# Patient Record
Sex: Female | Born: 1945 | Race: Black or African American | Hispanic: No | Smoking: Current some day smoker
Health system: Southern US, Community
[De-identification: ages and names within clinical notes are randomized; demographics above are authoritative.]

## PROBLEM LIST (undated history)

## (undated) DIAGNOSIS — J189 Pneumonia, unspecified organism: Secondary | ICD-10-CM

## (undated) DIAGNOSIS — R112 Nausea with vomiting, unspecified: Secondary | ICD-10-CM

## (undated) DIAGNOSIS — Z9889 Other specified postprocedural states: Secondary | ICD-10-CM

## (undated) DIAGNOSIS — K5792 Diverticulitis of intestine, part unspecified, without perforation or abscess without bleeding: Secondary | ICD-10-CM

## (undated) DIAGNOSIS — G8929 Other chronic pain: Secondary | ICD-10-CM

## (undated) DIAGNOSIS — M792 Neuralgia and neuritis, unspecified: Secondary | ICD-10-CM

## (undated) DIAGNOSIS — R011 Cardiac murmur, unspecified: Secondary | ICD-10-CM

## (undated) DIAGNOSIS — M5410 Radiculopathy, site unspecified: Secondary | ICD-10-CM

## (undated) DIAGNOSIS — R569 Unspecified convulsions: Secondary | ICD-10-CM

## (undated) DIAGNOSIS — K7460 Unspecified cirrhosis of liver: Secondary | ICD-10-CM

## (undated) DIAGNOSIS — M25551 Pain in right hip: Secondary | ICD-10-CM

## (undated) DIAGNOSIS — R102 Pelvic and perineal pain unspecified side: Secondary | ICD-10-CM

## (undated) DIAGNOSIS — M1990 Unspecified osteoarthritis, unspecified site: Secondary | ICD-10-CM

## (undated) DIAGNOSIS — I10 Essential (primary) hypertension: Secondary | ICD-10-CM

## (undated) DIAGNOSIS — M545 Low back pain, unspecified: Secondary | ICD-10-CM

## (undated) DIAGNOSIS — R350 Frequency of micturition: Secondary | ICD-10-CM

## (undated) DIAGNOSIS — M5430 Sciatica, unspecified side: Secondary | ICD-10-CM

## (undated) DIAGNOSIS — N189 Chronic kidney disease, unspecified: Secondary | ICD-10-CM

## (undated) DIAGNOSIS — M549 Dorsalgia, unspecified: Secondary | ICD-10-CM

## (undated) DIAGNOSIS — M25519 Pain in unspecified shoulder: Secondary | ICD-10-CM

## (undated) HISTORY — DX: Unspecified cirrhosis of liver: K74.60

## (undated) HISTORY — DX: Unspecified osteoarthritis, unspecified site: M19.90

## (undated) HISTORY — PX: ABDOMINAL HYSTERECTOMY: SHX81

## (undated) HISTORY — PX: JOINT REPLACEMENT: SHX530

## (undated) HISTORY — PX: CHOLECYSTECTOMY: SHX55

## (undated) HISTORY — PX: BACK SURGERY: SHX140

## (undated) HISTORY — PX: COLONOSCOPY W/ POLYPECTOMY: SHX1380

---

## 1998-02-24 ENCOUNTER — Other Ambulatory Visit: Admission: RE | Admit: 1998-02-24 | Discharge: 1998-02-24 | Payer: Self-pay | Admitting: Gastroenterology

## 2005-07-05 ENCOUNTER — Emergency Department (HOSPITAL_COMMUNITY): Admission: EM | Admit: 2005-07-05 | Discharge: 2005-07-05 | Payer: Self-pay | Admitting: Emergency Medicine

## 2009-10-04 ENCOUNTER — Emergency Department: Payer: Self-pay | Admitting: Emergency Medicine

## 2012-01-25 ENCOUNTER — Encounter (HOSPITAL_COMMUNITY): Payer: Self-pay | Admitting: *Deleted

## 2012-01-25 ENCOUNTER — Emergency Department (HOSPITAL_COMMUNITY): Payer: Medicare Other

## 2012-01-25 ENCOUNTER — Emergency Department (HOSPITAL_COMMUNITY)
Admission: EM | Admit: 2012-01-25 | Discharge: 2012-01-25 | Disposition: A | Discharge status: Home / Self Care | Payer: Medicare Other | Attending: Emergency Medicine | Admitting: Emergency Medicine

## 2012-01-25 DIAGNOSIS — Z882 Allergy status to sulfonamides status: Secondary | ICD-10-CM | POA: Insufficient documentation

## 2012-01-25 DIAGNOSIS — Z79899 Other long term (current) drug therapy: Secondary | ICD-10-CM | POA: Insufficient documentation

## 2012-01-25 DIAGNOSIS — G8929 Other chronic pain: Secondary | ICD-10-CM | POA: Insufficient documentation

## 2012-01-25 DIAGNOSIS — R109 Unspecified abdominal pain: Secondary | ICD-10-CM | POA: Insufficient documentation

## 2012-01-25 DIAGNOSIS — F17200 Nicotine dependence, unspecified, uncomplicated: Secondary | ICD-10-CM | POA: Insufficient documentation

## 2012-01-25 DIAGNOSIS — M159 Polyosteoarthritis, unspecified: Secondary | ICD-10-CM | POA: Insufficient documentation

## 2012-01-25 DIAGNOSIS — M549 Dorsalgia, unspecified: Secondary | ICD-10-CM

## 2012-01-25 DIAGNOSIS — Z880 Allergy status to penicillin: Secondary | ICD-10-CM | POA: Insufficient documentation

## 2012-01-25 DIAGNOSIS — Z888 Allergy status to other drugs, medicaments and biological substances status: Secondary | ICD-10-CM | POA: Insufficient documentation

## 2012-01-25 DIAGNOSIS — E119 Type 2 diabetes mellitus without complications: Secondary | ICD-10-CM | POA: Insufficient documentation

## 2012-01-25 DIAGNOSIS — M160 Bilateral primary osteoarthritis of hip: Secondary | ICD-10-CM

## 2012-01-25 DIAGNOSIS — I10 Essential (primary) hypertension: Secondary | ICD-10-CM | POA: Insufficient documentation

## 2012-01-25 HISTORY — DX: Other chronic pain: G89.29

## 2012-01-25 HISTORY — DX: Pelvic and perineal pain: R10.2

## 2012-01-25 HISTORY — DX: Pain in unspecified shoulder: M25.519

## 2012-01-25 HISTORY — DX: Dorsalgia, unspecified: M54.9

## 2012-01-25 HISTORY — DX: Pelvic and perineal pain unspecified side: R10.20

## 2012-01-25 HISTORY — DX: Sciatica, unspecified side: M54.30

## 2012-01-25 HISTORY — DX: Essential (primary) hypertension: I10

## 2012-01-25 HISTORY — DX: Pain in right hip: M25.551

## 2012-01-25 LAB — URINALYSIS, ROUTINE W REFLEX MICROSCOPIC
Bilirubin Urine: NEGATIVE
Glucose, UA: NEGATIVE mg/dL
Hgb urine dipstick: NEGATIVE
Ketones, ur: NEGATIVE mg/dL
Leukocytes, UA: NEGATIVE
Nitrite: NEGATIVE
Protein, ur: NEGATIVE mg/dL
Specific Gravity, Urine: 1.025 (ref 1.005–1.030)
Urobilinogen, UA: 0.2 mg/dL (ref 0.0–1.0)
pH: 6 (ref 5.0–8.0)

## 2012-01-25 LAB — BASIC METABOLIC PANEL WITH GFR
BUN: 18 mg/dL (ref 6–23)
CO2: 27 meq/L (ref 19–32)
Calcium: 10 mg/dL (ref 8.4–10.5)
Chloride: 103 meq/L (ref 96–112)
Creatinine, Ser: 0.94 mg/dL (ref 0.50–1.10)
GFR calc Af Amer: 72 mL/min — ABNORMAL LOW
GFR calc non Af Amer: 62 mL/min — ABNORMAL LOW
Glucose, Bld: 136 mg/dL — ABNORMAL HIGH (ref 70–99)
Potassium: 3.4 meq/L — ABNORMAL LOW (ref 3.5–5.1)
Sodium: 141 meq/L (ref 135–145)

## 2012-01-25 LAB — CBC WITH DIFFERENTIAL/PLATELET
Basophils Absolute: 0 10*3/uL (ref 0.0–0.1)
Basophils Relative: 0 % (ref 0–1)
Eosinophils Relative: 0 % (ref 0–5)
HCT: 38 % (ref 36.0–46.0)
Hemoglobin: 12.8 g/dL (ref 12.0–15.0)
MCHC: 33.7 g/dL (ref 30.0–36.0)
MCV: 85 fL (ref 78.0–100.0)
Monocytes Absolute: 0.3 10*3/uL (ref 0.1–1.0)
Monocytes Relative: 4 % (ref 3–12)
RDW: 12.9 % (ref 11.5–15.5)

## 2012-01-25 MED ORDER — OXYCODONE-ACETAMINOPHEN 5-325 MG PO TABS
1.0000 | ORAL_TABLET | ORAL | Status: DC | PRN
Start: 2012-01-25 — End: 2012-03-21

## 2012-01-25 MED ORDER — FENTANYL CITRATE 0.05 MG/ML IJ SOLN: 12.5000 ug | INTRAMUSCULAR | Status: DC | PRN

## 2012-01-25 NOTE — ED Notes (Signed)
Pt denies pain, pain med held per pt request

## 2012-01-25 NOTE — ED Provider Notes (Signed)
Pt improved She has no focal motor deficits in her extremities Suspect she has some lumbar radiculopathy.  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  We discussed these findings and the recommendation for MRI of her hips at some point.  I doubt she has septic joint at this time   Pt wants to go home.  Ortho referral given . Also short course of percocet given (has allergy to hydrocodone but PCP paperwork states she has used oxycodone before)  Joya Gaskins, MD 01/25/12 2224

## 2012-01-25 NOTE — ED Notes (Addendum)
Pt sent here from [facility]. Pt states she had a severe headache on Saturday night and was very confused on Sunday. Went to [facility] on Monday and DR told her she should have called 911 she could have been having a stroke. Pt refused to come to hospital from the [facility] office. Pt went to [facility] again today due to leg pain and leg weakness in right leg. Pt was given  A "shot" and was told to come straight to the hospital to rule out a stroke.

## 2012-01-25 NOTE — ED Notes (Signed)
Discharge instructions reviewed with pt, questions answered. Pt verbalized understanding.  

## 2012-01-25 NOTE — ED Provider Notes (Signed)
History     CSN: [id number]  Arrival date & time 01/25/12  4098   First MD Initiated Contact with Patient 01/25/12 1956      Chief Complaint  Patient presents with  . Leg Pain     HPI Pt was seen at 2005.  Per pt, c/o gradual onset and persistence of constant right sided low back "pain" for the past several days.  Pt describes her pain as radiating into her right leg and groin areas; as per her usual longstanding chronic pain pattern.  Pain worsens with movement and palpation of the area.  States the pain "makes me not be able to move my leg because my hip hurts."  Pt also c/o gradual onset and resolution of episode of "headache" 6 days ago.  States she was "confused" and "couldn't remember some things" for 2 days after the headache.  Feels she is improved today. Pt states she was eval by her PMD twice this week for same, was "given a shot" today, and then was sent to the ED for further eval.  Denies visual changes, no slurred speech, no dysphagia, no facial droop, no focal motor weakness, no tingling/numbness in extremities, no saddle anesthesia, no incont/retention of bowel/bladder, no fevers, no rash, no CP/SOB, no abd pain, no injury.      Past Medical History  Diagnosis Date  . Diabetes mellitus without complication   . Hypertension   . Chronic shoulder pain   . Chronic pelvic pain in female     pelvic joint and thigh  . Chronic back pain   . Chronic right hip pain   . Sciatica     Past Surgical History  Procedure Date  . Lumbago   . Neuralgia neuritis   . Radiculitis   . Partial hysterectomy     Family History  Problem Relation Age of Onset  . Hypertension Mother   . Hypertension Father     History  Substance Use Topics  . Smoking status: Current Every Day Smoker  . Smokeless tobacco: Not on file  . Alcohol Use: No     Review of Systems ROS: Statement: All systems negative except as marked or noted in the HPI; Constitutional: Negative for fever and chills. ;  ; Eyes: Negative for eye pain, redness and discharge. ; ; ENMT: Negative for ear pain, hoarseness, nasal congestion, sinus pressure and sore throat. ; ; Cardiovascular: Negative for chest pain, palpitations, diaphoresis, dyspnea and peripheral edema. ; ; Respiratory: Negative for cough, wheezing and stridor. ; ; Gastrointestinal: Negative for nausea, vomiting, diarrhea, abdominal pain, blood in stool, hematemesis, jaundice and rectal bleeding. . ; ; Genitourinary: Negative for dysuria, flank pain and hematuria. ; ; Musculoskeletal: +right back and hip pain. Negative for neck pain. Negative for swelling and trauma.; ; Skin: Negative for pruritus, rash, abrasions, blisters, bruising and skin lesion.; ; Neuro: Negative for current headache, lightheadedness and neck stiffness. Negative for weakness, altered level of consciousness , altered mental status, extremity weakness, paresthesias, involuntary movement, seizure and syncope.       Allergies  Clarithromycin; Codeine; Hydrocodone; Penicillins; and Sulfa antibiotics  Home Medications   Current Outpatient Rx  Name Route Sig Dispense Refill  . AMLODIPINE BESYLATE 10 MG PO TABS Oral Take 10 mg by mouth daily.    Marland Kitchen GABAPENTIN 600 MG PO TABS Oral Take 600 mg by mouth at bedtime as needed.    Marland Kitchen PREDNISONE (PAK) 10 MG PO TABS Oral Take 10-60 mg by mouth daily.  BP 143/79  Pulse 68  Temp 98 F (36.7 C)  Resp 20  Wt 174 lb (78.926 kg)  SpO2 100%  Physical Exam 2010: Physical examination:  Nursing notes reviewed; Vital signs and O2 SAT reviewed;  Constitutional: Well developed, Well nourished, Well hydrated, In no acute distress; Head:  Normocephalic, atraumatic; Eyes: EOMI, PERRL, No scleral icterus; ENMT: Mouth and pharynx normal, Mucous membranes moist; Neck: Supple, Full range of motion, No lymphadenopathy; Cardiovascular: Regular rate and rhythm, No gallop; Respiratory: Breath sounds clear & equal bilaterally, No rales, rhonchi, wheezes.   Speaking full sentences with ease, Normal respiratory effort/excursion; Chest: Nontender, Movement normal; Abdomen: Soft, Nontender, Nondistended, Normal bowel sounds; Genitourinary: No CVA tenderness; Spine:  No midline CS, TS, LS tenderness.  +TTP right lumbar paraspinal muscles;; Extremities: Pelvis stable. Pulses normal, No tenderness, No edema, No calf edema or asymmetry.; Neuro: AA&Ox3, Major CN grossly intact.  Strength 5/5 equal bilat UE's and LLE, strength 3/5 RLE at hip but 5/5 strength right knee/ankle/foot/toes.  DTR 2/4 equal bilat UE's and LE's.  No gross sensory deficits.  Normal cerebellar testing bilat UE's (finger-nose) and LE's (heel-shin). Speech clear.  No facial droop.  No nystagmus.;; Skin: Color normal, Warm, Dry.   ED Course  Procedures    MDM  MDM Reviewed: nursing note and vitals Interpretation: x-ray, labs, ECG and CT scan      Date: 01/25/2012  Rate: 67  Rhythm: normal sinus rhythm and premature atrial contractions (PAC)  QRS Axis: left  Intervals: normal  ST/T Wave abnormalities: normal  Conduction Disutrbances:none  Narrative Interpretation:   Old EKG Reviewed: none available.  Results for orders placed during the hospital encounter of 01/25/12  BASIC METABOLIC PANEL      Component Value Range   Sodium 141  135 - 145 mEq/L   Potassium 3.4 (*) 3.5 - 5.1 mEq/L   Chloride 103  96 - 112 mEq/L   CO2 27  19 - 32 mEq/L   Glucose, Bld 136 (*) 70 - 99 mg/dL   BUN 18  6 - 23 mg/dL   Creatinine, Ser 1.61  0.50 - 1.10 mg/dL   Calcium 09.6  8.4 - 04.5 mg/dL   GFR calc non Af Amer 62 (*) >90 mL/min   GFR calc Af Amer 72 (*) >90 mL/min  CBC WITH DIFFERENTIAL      Component Value Range   WBC 8.0  4.0 - 10.5 K/uL   RBC 4.47  3.87 - 5.11 MIL/uL   Hemoglobin 12.8  12.0 - 15.0 g/dL   HCT 40.9  81.1 - 91.4 %   MCV 85.0  78.0 - 100.0 fL   MCH 28.6  26.0 - 34.0 pg   MCHC 33.7  30.0 - 36.0 g/dL   RDW 78.2  95.6 - 21.3 %   Platelets 341  150 - 400 K/uL    Neutrophils Relative 74  43 - 77 %   Neutro Abs 5.9  1.7 - 7.7 K/uL   Lymphocytes Relative 22  12 - 46 %   Lymphs Abs 1.8  0.7 - 4.0 K/uL   Monocytes Relative 4  3 - 12 %   Monocytes Absolute 0.3  0.1 - 1.0 K/uL   Eosinophils Relative 0  0 - 5 %   Eosinophils Absolute 0.0  0.0 - 0.7 K/uL   Basophils Relative 0  0 - 1 %   Basophils Absolute 0.0  0.0 - 0.1 K/uL  TROPONIN I      Component Value  Range   Troponin I <0.30  <0.30 ng/mL   Dg Chest 2 View 01/25/2012  *RADIOLOGY REPORT*  Clinical Data: Confusion.  CHEST - 2 VIEW  Comparison: No priors.  Findings: Lung volumes are normal.  No consolidative airspace disease.  No pleural effusions.  No pneumothorax.  No pulmonary nodule or mass noted.  Pulmonary vasculature and the cardiomediastinal silhouette are within normal limits. Atherosclerosis in the thoracic aorta.  IMPRESSION: 1. No radiographic evidence of acute cardiopulmonary disease. 2.  Atherosclerosis.   Original Report Authenticated By: Trudie Reed, M.D.    Dg Lumbar Spine Complete 01/25/2012  *RADIOLOGY REPORT*  Clinical Data: Back pain.  LUMBAR SPINE - COMPLETE 4+ VIEW  Comparison: No priors.  Findings: Multiple views of the lumbar spine demonstrate no definite acute displaced fracture or compression type fracture. There is multilevel degenerative disc disease and lumbar spondylosis, most severe at L2-L3 and L5-S1. Alignment is anatomic. Atherosclerotic calcifications are noted in the visualized vasculature.  Surgical clips project over the right upper quadrant of the abdomen, likely from prior cholecystectomy.  Numerous phleboliths.  In addition, there is a 1.9 x 1.1 cm calcification in the right hemi pelvis.  IMPRESSION: 1.  No acute radiographic abnormality of the lumbar spine. 2.  Multilevel degenerative disc disease and lumbar spondylosis, as above. 3.  1.9 x 1.1 cm calcified structure in the right hemi pelvis.  It is uncertain whether not this simply represents a very large  phlebolith, something within the fecal stream, or possibly a bladder calculus or distal right ureteral calculus.  Clinical correlation is recommended.  If there is clinical concern for ureteral or bladder stone, this could be better evaluated with noncontrast CT of the abdomen and pelvis.   Original Report Authenticated By: Trudie Reed, M.D.    Ct Head Wo Contrast 01/25/2012  *RADIOLOGY REPORT*  Clinical Data: confusion, altered level of consciousness.  CT HEAD WITHOUT CONTRAST  Technique:  Contiguous axial images were obtained from the base of the skull through the vertex without contrast.  Comparison: None.  Findings: No acute intracranial abnormality.  Specifically, no hemorrhage, hydrocephalus, mass lesion, acute infarction, or significant intracranial injury.  No acute calvarial abnormality. Visualized paranasal sinuses and mastoids clear.  Orbital soft tissues unremarkable.  IMPRESSION: Normal study.   Original Report Authenticated By: Charlett Nose, M.D.      2140:  Pt has climbed off the stretcher and ambulated to the bathroom with steady gait.  States she feels "much better now" after pain meds.  Pt RLE re-exam with 5/5 strength throughout.  Doubt CVA at this time; esp with normal CT head and symptoms for several days.  Appears to be acute flair of chronic back pain.  Will obtain CT A/P without contrast as suggested on XR LS above.  UA pending. Sign out to Dr. Bebe Shaggy.         Laray Anger, DO 01/27/12 2354

## 2012-01-25 NOTE — ED Notes (Signed)
Pt still denies pain 

## 2012-01-27 LAB — URINE CULTURE: Colony Count: 2000

## 2012-02-03 ENCOUNTER — Emergency Department (HOSPITAL_COMMUNITY)
Admission: EM | Admit: 2012-02-03 | Discharge: 2012-02-03 | Disposition: A | Discharge status: Home / Self Care | Payer: Medicare Other | Attending: Emergency Medicine | Admitting: Emergency Medicine

## 2012-02-03 ENCOUNTER — Encounter (HOSPITAL_COMMUNITY): Payer: Self-pay | Admitting: *Deleted

## 2012-02-03 DIAGNOSIS — R42 Dizziness and giddiness: Secondary | ICD-10-CM | POA: Insufficient documentation

## 2012-02-03 DIAGNOSIS — E119 Type 2 diabetes mellitus without complications: Secondary | ICD-10-CM | POA: Insufficient documentation

## 2012-02-03 DIAGNOSIS — M549 Dorsalgia, unspecified: Secondary | ICD-10-CM | POA: Insufficient documentation

## 2012-02-03 DIAGNOSIS — Z79899 Other long term (current) drug therapy: Secondary | ICD-10-CM | POA: Insufficient documentation

## 2012-02-03 DIAGNOSIS — M25519 Pain in unspecified shoulder: Secondary | ICD-10-CM | POA: Insufficient documentation

## 2012-02-03 DIAGNOSIS — F17200 Nicotine dependence, unspecified, uncomplicated: Secondary | ICD-10-CM | POA: Insufficient documentation

## 2012-02-03 DIAGNOSIS — R404 Transient alteration of awareness: Secondary | ICD-10-CM | POA: Insufficient documentation

## 2012-02-03 DIAGNOSIS — N949 Unspecified condition associated with female genital organs and menstrual cycle: Secondary | ICD-10-CM | POA: Insufficient documentation

## 2012-02-03 DIAGNOSIS — I10 Essential (primary) hypertension: Secondary | ICD-10-CM | POA: Insufficient documentation

## 2012-02-03 DIAGNOSIS — R55 Syncope and collapse: Secondary | ICD-10-CM | POA: Insufficient documentation

## 2012-02-03 DIAGNOSIS — M25559 Pain in unspecified hip: Secondary | ICD-10-CM | POA: Insufficient documentation

## 2012-02-03 DIAGNOSIS — G8929 Other chronic pain: Secondary | ICD-10-CM | POA: Insufficient documentation

## 2012-02-03 DIAGNOSIS — M5430 Sciatica, unspecified side: Secondary | ICD-10-CM | POA: Insufficient documentation

## 2012-02-03 LAB — CBC WITH DIFFERENTIAL/PLATELET
Basophils Absolute: 0 10*3/uL (ref 0.0–0.1)
Basophils Relative: 0 % (ref 0–1)
HCT: 39.6 % (ref 36.0–46.0)
Lymphocytes Relative: 43 % (ref 12–46)
MCHC: 33.6 g/dL (ref 30.0–36.0)
Neutro Abs: 5 10*3/uL (ref 1.7–7.7)
Neutrophils Relative %: 51 % (ref 43–77)
Platelets: 346 10*3/uL (ref 150–400)
RDW: 13.2 % (ref 11.5–15.5)
WBC: 9.9 10*3/uL (ref 4.0–10.5)

## 2012-02-03 LAB — URINALYSIS, ROUTINE W REFLEX MICROSCOPIC
Bilirubin Urine: NEGATIVE
Leukocytes, UA: NEGATIVE
Nitrite: NEGATIVE
Specific Gravity, Urine: >1.03 — ABNORMAL HIGH (ref 1.005–1.030)
Urobilinogen, UA: 0.2 mg/dL (ref 0.0–1.0)
pH: 6 (ref 5.0–8.0)

## 2012-02-03 LAB — BASIC METABOLIC PANEL
BUN: 9 mg/dL (ref 6–23)
Calcium: 10.1 mg/dL (ref 8.4–10.5)
Chloride: 103 mEq/L (ref 96–112)
Creatinine, Ser: 0.83 mg/dL (ref 0.50–1.10)
GFR calc Af Amer: 83 mL/min — ABNORMAL LOW (ref >90)
GFR calc non Af Amer: 72 mL/min — ABNORMAL LOW (ref >90)

## 2012-02-03 MED ORDER — PROMETHAZINE HCL 25 MG PO TABS: 25.0000 mg | ORAL_TABLET | Freq: Three times a day (TID) | ORAL | Status: DC | PRN

## 2012-02-03 MED ORDER — HYDROMORPHONE HCL 4 MG PO TABS: 4.0000 mg | ORAL_TABLET | ORAL | Status: DC | PRN

## 2012-02-03 MED ORDER — ONDANSETRON HCL 4 MG/2ML IJ SOLN
4.0000 mg | Freq: Once | INTRAMUSCULAR | Status: AC
  Administered 2012-02-03: 4 mg via INTRAVENOUS

## 2012-02-03 MED ORDER — POTASSIUM CHLORIDE CRYS ER 20 MEQ PO TBCR
40.0000 meq | EXTENDED_RELEASE_TABLET | Freq: Once | ORAL | Status: AC
  Administered 2012-02-03: 40 meq via ORAL
  Filled 2012-02-03: qty 2

## 2012-02-03 MED ORDER — HYDROMORPHONE HCL PF 2 MG/ML IJ SOLN
2.0000 mg | Freq: Once | INTRAMUSCULAR | Status: AC
  Administered 2012-02-03: 2 mg via INTRAVENOUS
  Filled 2012-02-03: qty 1

## 2012-02-03 MED ORDER — PROMETHAZINE HCL 25 MG/ML IJ SOLN
25.0000 mg | Freq: Once | INTRAMUSCULAR | Status: AC
  Administered 2012-02-03: 25 mg via INTRAVENOUS
  Filled 2012-02-03: qty 1

## 2012-02-03 MED ORDER — SODIUM CHLORIDE 0.9 % IV SOLN
INTRAVENOUS | Status: DC
  Administered 2012-02-03: 125 mL/h via INTRAVENOUS

## 2012-02-03 MED ORDER — ONDANSETRON HCL 4 MG/2ML IJ SOLN
INTRAMUSCULAR | Status: AC
  Administered 2012-02-03: 4 mg via INTRAVENOUS
  Filled 2012-02-03: qty 2

## 2012-02-03 MED ORDER — ONDANSETRON HCL 4 MG/2ML IJ SOLN
4.0000 mg | Freq: Once | INTRAMUSCULAR | Status: AC
Start: 2012-02-03 — End: 2012-02-03
  Administered 2012-02-03: 4 mg via INTRAVENOUS
  Filled 2012-02-03: qty 2

## 2012-02-03 NOTE — ED Notes (Signed)
R hip pain began last week, progressively worsening.  Was seen in [facility] ER on 01/25/12.  States Oxycodone made her sick and she has not been taking it.  Has not followed up w/orthopedist as instructed.  Pain 10/10 R hip radiating across pelvis to L hip.

## 2012-02-03 NOTE — ED Notes (Signed)
Pt ambulated with assistance to restroom.  Became very nauseated and vomited apx 300 ccs.  MD aware.

## 2012-02-03 NOTE — ED Provider Notes (Addendum)
History   This chart was scribed for Cheri Guppy, MD by Melba Coon, ED Scribe. The patient was seen in room [facility] and the patient's care was started at 4:51PM.    CSN: [id number]  Arrival date & time 02/03/12  1501   First MD Initiated Contact with Patient 02/03/12 1643      Chief Complaint  Patient presents with  . Hip Pain  . Loss of Consciousness    (Consider location/radiation/quality/duration/timing/severity/associated sxs/prior treatment) The history is provided by the patient. No language interpreter was used.   Jessica Hoover is a 66 y.o. female who presents to the Emergency Department complaining of an episode of LOC with an onset today. Jessica Hoover was taking a shower when she felt dizzy with brief mild visual changes. She then went to her bed where her legs gave way and she collapsed onto the bed. She does not report dizziness or visual changes at the time of exam. Last week, she fell on her right hip which has progressively gotten worse, radiates thru the upper leg and was weak today which contributed to her fall today. She was taking oxycodone which has not alleviated the pain and reports GI symptoms while taking the oxycodone. She asks if she can be admitted to Hospice care if her condition does not improve today. No other pertinent medical symptoms.  Past Medical History  Diagnosis Date  . Diabetes mellitus without complication   . Hypertension   . Chronic shoulder pain   . Chronic pelvic pain in female     pelvic joint and thigh  . Chronic back pain   . Chronic right hip pain   . Sciatica     Past Surgical History  Procedure Date  . Lumbago   . Neuralgia neuritis   . Radiculitis   . Partial hysterectomy     Family History  Problem Relation Age of Onset  . Hypertension Mother   . Hypertension Father     History  Substance Use Topics  . Smoking status: Current Every Day Smoker  . Smokeless tobacco: Not on file  . Alcohol Use: No    OB  History    Grav Para Term Preterm Abortions TAB SAB Ect Mult Living                  Review of Systems  Constitutional: Negative for fatigue.  HENT: Negative for congestion, sinus pressure and ear discharge.   Eyes: Negative for discharge.  Respiratory: Negative for cough.   Cardiovascular: Positive for syncope. Negative for chest pain.  Gastrointestinal: Negative for abdominal pain and diarrhea.  Genitourinary: Negative for frequency and hematuria.  Musculoskeletal: Positive for arthralgias (right hip). Negative for back pain.  Skin: Negative for rash.  Neurological: Positive for dizziness. Negative for seizures and headaches.  Hematological: Negative.   Psychiatric/Behavioral: Negative for hallucinations.  10 Systems reviewed and all are negative for acute change except as noted in the HPI.    Allergies  Clarithromycin; Codeine; Hydrocodone; Penicillins; Sulfa antibiotics; and Oxycodone hcl  Home Medications   Current Outpatient Rx  Name  Route  Sig  Dispense  Refill  . AMLODIPINE BESYLATE 10 MG PO TABS   Oral   Take 10 mg by mouth daily.         Marland Kitchen PREDNISONE (PAK) 10 MG PO TABS   Oral   Take 10-60 mg by mouth daily.         Marland Kitchen GABAPENTIN 600 MG PO TABS   Oral  Take 600 mg by mouth at bedtime as needed.         . OXYCODONE-ACETAMINOPHEN 5-325 MG PO TABS   Oral   Take 1 tablet by mouth every 4 (four) hours as needed for pain.   15 tablet   0     BP 146/77  Pulse 85  Temp 98.3 F (36.8 C) (Oral)  Resp 16  Ht 5\' 4"  (1.626 m)  Wt 173 lb (78.472 kg)  BMI 29.70 kg/m2  SpO2 97%  Physical Exam  Nursing note and vitals reviewed. Constitutional:       Awake, alert, nontoxic appearance.  HENT:  Head: Normocephalic and atraumatic.  Eyes: Conjunctivae normal and EOM are normal. Pupils are equal, round, and reactive to light. Right eye exhibits no discharge. Left eye exhibits no discharge.  Neck: Normal range of motion. Neck supple. Carotid bruit is not  present.  Cardiovascular: Normal rate, regular rhythm, normal heart sounds and intact distal pulses.   No murmur heard. Pulmonary/Chest: Effort normal and breath sounds normal. No respiratory distress. She has no wheezes. She has no rales. She exhibits no tenderness.  Abdominal: Soft. Bowel sounds are normal. There is tenderness (suprapubic and RLQ tenderness with no peritoneal signs). There is no rebound.  Musculoskeletal: She exhibits no tenderness.       Baseline ROM, no obvious new focal weakness.  Neurological:       Mental status and motor strength appears baseline for patient and situation.  Skin: No rash noted.  Psychiatric: She has a normal mood and affect.    ED Course  Procedures (including critical care time)  COORDINATION OF CARE:  5:03PM - EKG, blood w/u, and UA will be ordered for Jessica Hoover.   Labs Reviewed - No data to display No results found.   No diagnosis found.  7:43 PM Pain controlled. Vomiting. i suspect from narcotics. Will give phenergan now.  8:56 PM sxs all controlled.  MDM  Chronic hip pain   I personally performed the services described in this documentation, which was scribed in my presence. The recorded information has been reviewed and is accurate.       Cheri Guppy, MD 02/03/12 1944  Cheri Guppy, MD 02/03/12 [phone]

## 2012-02-03 NOTE — ED Notes (Signed)
Patient states she "passed out" today after getting showered.  Felt like her legs gave way, she lost conciuosness briefly, then crawled to phone to [facility].

## 2012-03-21 ENCOUNTER — Encounter (HOSPITAL_COMMUNITY): Payer: Self-pay | Admitting: Emergency Medicine

## 2012-03-21 ENCOUNTER — Other Ambulatory Visit: Payer: Self-pay

## 2012-03-21 ENCOUNTER — Emergency Department (HOSPITAL_COMMUNITY)
Admission: EM | Admit: 2012-03-21 | Discharge: 2012-03-21 | Disposition: A | Discharge status: Home / Self Care | Payer: Medicare Other | Attending: Emergency Medicine | Admitting: Emergency Medicine

## 2012-03-21 DIAGNOSIS — M549 Dorsalgia, unspecified: Secondary | ICD-10-CM | POA: Insufficient documentation

## 2012-03-21 DIAGNOSIS — Z8739 Personal history of other diseases of the musculoskeletal system and connective tissue: Secondary | ICD-10-CM | POA: Insufficient documentation

## 2012-03-21 DIAGNOSIS — N949 Unspecified condition associated with female genital organs and menstrual cycle: Secondary | ICD-10-CM | POA: Insufficient documentation

## 2012-03-21 DIAGNOSIS — R110 Nausea: Secondary | ICD-10-CM | POA: Insufficient documentation

## 2012-03-21 DIAGNOSIS — M25519 Pain in unspecified shoulder: Secondary | ICD-10-CM | POA: Insufficient documentation

## 2012-03-21 DIAGNOSIS — E119 Type 2 diabetes mellitus without complications: Secondary | ICD-10-CM | POA: Insufficient documentation

## 2012-03-21 DIAGNOSIS — R197 Diarrhea, unspecified: Secondary | ICD-10-CM | POA: Insufficient documentation

## 2012-03-21 DIAGNOSIS — Z9089 Acquired absence of other organs: Secondary | ICD-10-CM | POA: Insufficient documentation

## 2012-03-21 DIAGNOSIS — K5289 Other specified noninfective gastroenteritis and colitis: Secondary | ICD-10-CM | POA: Insufficient documentation

## 2012-03-21 DIAGNOSIS — Z79899 Other long term (current) drug therapy: Secondary | ICD-10-CM | POA: Insufficient documentation

## 2012-03-21 DIAGNOSIS — M25559 Pain in unspecified hip: Secondary | ICD-10-CM | POA: Insufficient documentation

## 2012-03-21 DIAGNOSIS — F17200 Nicotine dependence, unspecified, uncomplicated: Secondary | ICD-10-CM | POA: Insufficient documentation

## 2012-03-21 DIAGNOSIS — K529 Noninfective gastroenteritis and colitis, unspecified: Secondary | ICD-10-CM

## 2012-03-21 DIAGNOSIS — I10 Essential (primary) hypertension: Secondary | ICD-10-CM | POA: Insufficient documentation

## 2012-03-21 DIAGNOSIS — G8929 Other chronic pain: Secondary | ICD-10-CM | POA: Insufficient documentation

## 2012-03-21 LAB — COMPREHENSIVE METABOLIC PANEL
CO2: 28 mEq/L (ref 19–32)
Calcium: 10.2 mg/dL (ref 8.4–10.5)
Creatinine, Ser: 0.97 mg/dL (ref 0.50–1.10)
GFR calc Af Amer: 69 mL/min — ABNORMAL LOW (ref >90)
GFR calc non Af Amer: 60 mL/min — ABNORMAL LOW (ref >90)
Glucose, Bld: 124 mg/dL — ABNORMAL HIGH (ref 70–99)
Total Bilirubin: 0.4 mg/dL (ref 0.3–1.2)

## 2012-03-21 LAB — CBC WITH DIFFERENTIAL/PLATELET
Eosinophils Relative: 2 % (ref 0–5)
HCT: 37.8 % (ref 36.0–46.0)
Hemoglobin: 12.4 g/dL (ref 12.0–15.0)
Lymphocytes Relative: 31 % (ref 12–46)
Lymphs Abs: 1.8 10*3/uL (ref 0.7–4.0)
MCV: 84.6 fL (ref 78.0–100.0)
Monocytes Absolute: 0.2 10*3/uL (ref 0.1–1.0)
Monocytes Relative: 4 % (ref 3–12)
RBC: 4.47 MIL/uL (ref 3.87–5.11)
WBC: 5.7 10*3/uL (ref 4.0–10.5)

## 2012-03-21 LAB — LIPASE, BLOOD: Lipase: 24 U/L (ref 11–59)

## 2012-03-21 MED ORDER — ONDANSETRON HCL 4 MG/2ML IJ SOLN
4.0000 mg | Freq: Once | INTRAMUSCULAR | Status: AC
  Administered 2012-03-21: 4 mg via INTRAVENOUS
  Filled 2012-03-21: qty 2

## 2012-03-21 MED ORDER — KETOROLAC TROMETHAMINE 30 MG/ML IJ SOLN
30.0000 mg | Freq: Once | INTRAMUSCULAR | Status: AC
  Administered 2012-03-21: 30 mg via INTRAVENOUS
  Filled 2012-03-21: qty 1

## 2012-03-21 MED ORDER — ONDANSETRON 8 MG PO TBDP: ORAL_TABLET | ORAL | Status: DC

## 2012-03-21 MED ORDER — SODIUM CHLORIDE 0.9 % IV BOLUS (SEPSIS)
1000.0000 mL | Freq: Once | INTRAVENOUS | Status: AC
  Administered 2012-03-21: 1000 mL via INTRAVENOUS

## 2012-03-21 NOTE — ED Provider Notes (Signed)
History    This chart was scribed for Geoffery Lyons, MD, MD by Smitty Pluck, ED Scribe. The patient was seen in room [facility]01 and the patient's care was started at 11:50AM.   CSN: [id number]  Arrival date & time 03/21/12  1059      Chief Complaint  Patient presents with  . Abdominal Pain  . Nausea    (Consider location/radiation/quality/duration/timing/severity/associated sxs/prior treatment) HPI Jessica Hoover is a 66 y.o. female with hx of DM and HTN who presents to the Emergency Department complaining of constant, moderate nausea onset today. Pt reports that she ate breakfast in hospital cafeteria and suddenly she noticed moderate abdominal cramping and nausea. She states that she had diarrhea. Pt denies vomiting, fever, chills, sick contact, cough, SOB and any other pain. She reports hx of gallbladder removal. Denies taking medication PTA.   Past Medical History  Diagnosis Date  . Diabetes mellitus without complication   . Hypertension   . Chronic shoulder pain   . Chronic pelvic pain in female     pelvic joint and thigh  . Chronic back pain   . Chronic right hip pain   . Sciatica     Past Surgical History  Procedure Date  . Lumbago   . Neuralgia neuritis   . Radiculitis   . Partial hysterectomy     Family History  Problem Relation Age of Onset  . Hypertension Mother   . Hypertension Father     History  Substance Use Topics  . Smoking status: Current Every Day Smoker  . Smokeless tobacco: Not on file  . Alcohol Use: No    OB History    Grav Para Term Preterm Abortions TAB SAB Ect Mult Living                  Review of Systems 10 Systems reviewed and all are negative for acute change except as noted in the HPI.   Allergies  Clarithromycin; Codeine; Hydrocodone; Penicillins; Sulfa antibiotics; and Oxycodone hcl  Home Medications   Current Outpatient Rx  Name  Route  Sig  Dispense  Refill  . AMLODIPINE BESYLATE 10 MG PO TABS   Oral   Take 10 mg by  mouth daily.         Marland Kitchen GABAPENTIN 600 MG PO TABS   Oral   Take 600 mg by mouth at bedtime as needed.         Marland Kitchen HYDROMORPHONE HCL 4 MG PO TABS   Oral   Take 1 tablet (4 mg total) by mouth every 4 (four) hours as needed for pain.   10 tablet   0   . OXYCODONE-ACETAMINOPHEN 5-325 MG PO TABS   Oral   Take 1 tablet by mouth every 4 (four) hours as needed for pain.   15 tablet   0   . PREDNISONE (PAK) 10 MG PO TABS   Oral   Take 10-60 mg by mouth daily.         Marland Kitchen PROMETHAZINE HCL 25 MG PO TABS   Oral   Take 1 tablet (25 mg total) by mouth every 8 (eight) hours as needed for nausea.   20 tablet   0     BP 115/49  Pulse 62  Temp 98.2 F (36.8 C)  Resp 18  Ht 5\' 5"  (1.651 m)  Wt 174 lb (78.926 kg)  BMI 28.96 kg/m2  SpO2 99%  Physical Exam  Nursing note and vitals reviewed. Constitutional: She is oriented  to person, place, and time. She appears well-developed and well-nourished. No distress.  HENT:  Head: Normocephalic and atraumatic.  Mouth/Throat: Oropharynx is clear and moist.  Eyes: EOM are normal. Pupils are equal, round, and reactive to light.  Neck: Normal range of motion. Neck supple. No tracheal deviation present.  Cardiovascular: Normal rate, regular rhythm and normal heart sounds.   Pulmonary/Chest: Effort normal and breath sounds normal. No respiratory distress. She has no wheezes. She has no rales.  Abdominal: Soft. Bowel sounds are normal. She exhibits no distension. There is no tenderness. There is no rebound and no guarding.  Musculoskeletal: Normal range of motion.  Neurological: She is alert and oriented to person, place, and time.  Skin: Skin is warm and dry.  Psychiatric: She has a normal mood and affect. Her behavior is normal.    ED Course  Procedures (including critical care time) DIAGNOSTIC STUDIES: Oxygen Saturation is 99% on room air, normal by my interpretation.    COORDINATION OF CARE: 11:52 AM Discussed ED treatment with pt       Labs Reviewed  GLUCOSE, CAPILLARY   No results found.   No diagnosis found.   Date: 03/21/2012  Rate: 61  Rhythm: normal sinus rhythm  QRS Axis: left  Intervals: PR prolonged  ST/T Wave abnormalities: nonspecific T wave changes  Conduction Disutrbances:first-degree A-V block   Narrative Interpretation:   Old EKG Reviewed: unchanged   MDM  The patient presents with n/v/d after eating greasy food in the cafeteria.  She is feeling better and the abdominal exam is unremarkable.  The labs are reassuring.  She will be discharged with zofran, return prn.      I personally performed the services described in this documentation, which was scribed in my presence. The recorded information has been reviewed and is accurate.      Geoffery Lyons, MD 03/21/12 1310

## 2012-03-21 NOTE — ED Notes (Addendum)
Pt c/o feeling dizzy/hot/clammy/nauseous/d after eating x 1 hour ago. Denies cp/sob. C/o some abd cramping. nad noted.

## 2014-10-15 ENCOUNTER — Encounter (HOSPITAL_COMMUNITY)
Admission: RE | Admit: 2014-10-15 | Discharge: 2014-10-15 | Disposition: A | Discharge status: Home / Self Care | Payer: Medicare HMO | Source: Ambulatory Visit | Attending: Orthopaedic Surgery | Admitting: Orthopaedic Surgery

## 2014-10-15 ENCOUNTER — Encounter (HOSPITAL_COMMUNITY)
Admission: RE | Admit: 2014-10-15 | Discharge: 2014-10-15 | Disposition: A | Discharge status: Home / Self Care | Payer: Medicare HMO | Source: Ambulatory Visit | Attending: Orthopedic Surgery | Admitting: Orthopedic Surgery

## 2014-10-15 ENCOUNTER — Encounter (HOSPITAL_COMMUNITY): Payer: Self-pay

## 2014-10-15 ENCOUNTER — Other Ambulatory Visit: Payer: Self-pay

## 2014-10-15 DIAGNOSIS — E119 Type 2 diabetes mellitus without complications: Secondary | ICD-10-CM | POA: Diagnosis not present

## 2014-10-15 DIAGNOSIS — Z01812 Encounter for preprocedural laboratory examination: Secondary | ICD-10-CM | POA: Diagnosis not present

## 2014-10-15 DIAGNOSIS — I10 Essential (primary) hypertension: Secondary | ICD-10-CM | POA: Diagnosis not present

## 2014-10-15 DIAGNOSIS — M1611 Unilateral primary osteoarthritis, right hip: Secondary | ICD-10-CM | POA: Diagnosis not present

## 2014-10-15 DIAGNOSIS — Z87891 Personal history of nicotine dependence: Secondary | ICD-10-CM | POA: Diagnosis not present

## 2014-10-15 DIAGNOSIS — Z01818 Encounter for other preprocedural examination: Secondary | ICD-10-CM | POA: Diagnosis present

## 2014-10-15 DIAGNOSIS — Z0183 Encounter for blood typing: Secondary | ICD-10-CM | POA: Insufficient documentation

## 2014-10-15 HISTORY — DX: Nausea with vomiting, unspecified: R11.2

## 2014-10-15 HISTORY — DX: Frequency of micturition: R35.0

## 2014-10-15 HISTORY — DX: Unspecified convulsions: R56.9

## 2014-10-15 HISTORY — DX: Pneumonia, unspecified organism: J18.9

## 2014-10-15 HISTORY — DX: Unspecified osteoarthritis, unspecified site: M19.90

## 2014-10-15 HISTORY — DX: Nausea with vomiting, unspecified: Z98.890

## 2014-10-15 LAB — CBC WITH DIFFERENTIAL/PLATELET
Basophils Absolute: 0 10*3/uL (ref 0.0–0.1)
Basophils Relative: 1 % (ref 0–1)
EOS PCT: 4 % (ref 0–5)
Eosinophils Absolute: 0.2 10*3/uL (ref 0.0–0.7)
HCT: 40.5 % (ref 36.0–46.0)
Hemoglobin: 13.2 g/dL (ref 12.0–15.0)
LYMPHS PCT: 39 % (ref 12–46)
Lymphs Abs: 2.2 10*3/uL (ref 0.7–4.0)
MCH: 27.5 pg (ref 26.0–34.0)
MCHC: 32.6 g/dL (ref 30.0–36.0)
MCV: 84.4 fL (ref 78.0–100.0)
MONOS PCT: 4 % (ref 3–12)
Monocytes Absolute: 0.2 10*3/uL (ref 0.1–1.0)
NEUTROS ABS: 3.1 10*3/uL (ref 1.7–7.7)
NEUTROS PCT: 52 % (ref 43–77)
Platelets: 366 10*3/uL (ref 150–400)
RBC: 4.8 MIL/uL (ref 3.87–5.11)
RDW: 12.9 % (ref 11.5–15.5)
WBC: 5.8 10*3/uL (ref 4.0–10.5)

## 2014-10-15 LAB — URINALYSIS, ROUTINE W REFLEX MICROSCOPIC
BILIRUBIN URINE: NEGATIVE
Glucose, UA: NEGATIVE mg/dL
HGB URINE DIPSTICK: NEGATIVE
Ketones, ur: NEGATIVE mg/dL
Leukocytes, UA: NEGATIVE
Nitrite: NEGATIVE
PH: 6 (ref 5.0–8.0)
Protein, ur: NEGATIVE mg/dL
SPECIFIC GRAVITY, URINE: 1.008 (ref 1.005–1.030)
UROBILINOGEN UA: 0.2 mg/dL (ref 0.0–1.0)

## 2014-10-15 LAB — COMPREHENSIVE METABOLIC PANEL
ALT: 16 U/L (ref 14–54)
ANION GAP: 9 (ref 5–15)
AST: 15 U/L (ref 15–41)
Albumin: 4.1 g/dL (ref 3.5–5.0)
Alkaline Phosphatase: 109 U/L (ref 38–126)
BILIRUBIN TOTAL: 0.5 mg/dL (ref 0.3–1.2)
BUN: 10 mg/dL (ref 6–20)
CO2: 30 mmol/L (ref 22–32)
CREATININE: 1.01 mg/dL — AB (ref 0.44–1.00)
Calcium: 10 mg/dL (ref 8.9–10.3)
Chloride: 101 mmol/L (ref 101–111)
GFR calc Af Amer: >60 mL/min (ref >60)
GFR, EST NON AFRICAN AMERICAN: 55 mL/min — AB (ref >60)
Glucose, Bld: 115 mg/dL — ABNORMAL HIGH (ref 65–99)
Potassium: 3.3 mmol/L — ABNORMAL LOW (ref 3.5–5.1)
Sodium: 140 mmol/L (ref 135–145)
TOTAL PROTEIN: 7.8 g/dL (ref 6.5–8.1)

## 2014-10-15 LAB — SURGICAL PCR SCREEN
MRSA, PCR: NEGATIVE
STAPHYLOCOCCUS AUREUS: NEGATIVE

## 2014-10-15 LAB — PROTIME-INR
INR: 0.97 (ref 0.00–1.49)
Prothrombin Time: 13.1 seconds (ref 11.6–15.2)

## 2014-10-15 LAB — APTT: aPTT: 28 seconds (ref 24–37)

## 2014-10-15 LAB — ABO/RH: ABO/RH(D): O POS

## 2014-10-15 NOTE — Pre-Procedure Instructions (Signed)
Jessica Hoover  10/15/2014     Your procedure is scheduled on : Tuesday October 26, 2014 at 7:15 AM.  Report to [facility] Admitting at 5:30 A.M.  Call this number if you have problems the morning of surgery: [phone]    Remember:  Do not eat food or drink liquids after midnight.  Take these medicines the morning of surgery with A SIP OF WATER : Amlodipine (Norvasc)   Stop taking any vitamins, herbal medications, Meloxicam/Mobic, Ibuprofen, Motrin, Aleve, etc on Tuesday July 26th   Do not wear jewelry, make-up or nail polish.  Do not wear lotions, powders, or perfumes.    Do not shave 48 hours prior to surgery.   Do not bring valuables to the hospital.  [facility] is not responsible for any belongings or valuables.  Contacts, dentures or bridgework may not be worn into surgery.  Leave your suitcase in the car.  After surgery it may be brought to your room.  For patients admitted to the hospital, discharge time will be determined by your treatment team.  Patients discharged the day of surgery will not be allowed to drive home.   Name and phone number of your driver:    Special instructions:  Shower using CHG soap the night before and the morning of your surgery  Please read over the following fact sheets that you were given. Pain Booklet, Coughing and Deep Breathing, Blood Transfusion Information, Total Joint Packet, MRSA Information and Surgical Site Infection Prevention

## 2014-10-15 NOTE — Progress Notes (Signed)
PCP is [employer]. Patient denied having any acute cardiac or pulmonary issues.

## 2014-10-16 LAB — TYPE AND SCREEN
ABO/RH(D): O POS
Antibody Screen: NEGATIVE

## 2014-10-16 LAB — URINE CULTURE: Culture: NO GROWTH

## 2014-10-21 NOTE — H&P (Signed)
CHIEF COMPLAINT:  Painful right hip.    HISTORY OF PRESENT ILLNESS:  Jessica Hoover is a very pleasant 69 year old African American female who is seen today for evaluation of her right hip.  She has been seen previously dating back to 2016 by another orthopedist who had noted that she was having symptoms in her right hip and groin area.  At that time, a corticosteroid injection was initiated with some benefit.  There was some question as to whether there was also pathology in the lumbar spine.  She was significantly uncomfortable and now she is worsening.  She has difficulty with sleeping as well as in performing her activities of daily living.  She does have bilateral groin pain and some anterior thigh pain.  Again, worsens with activity.  Improves with rest, but still is unable to sleep well.  She denies any history of injury or trauma.  Seen today for evaluation.     HEALTH HISTORY:  In general, her health is good.     HOSPITALIZATIONS:   1.  In 2011, last lumbar surgery.   2.  She has had 2 total.  Cholecystectomy, date unknown.  3.  Vaginal hysterectomy, date unknown. 4.  D&C, date unknown.     MEDICATIONS:  Norvasc 10 mg daily.    ALLERGIES:  CODEINE, OXYCODONE, HYDROCODONE, DARVOCET, DEMEROL all give her shortness of breath.  She also has problems with PENICILLIN that also gives her shortness of breath.  She has been able to take vancomycin according to her.  She does have allergies to SULFA.  CELEBREX and BEXTRA makes her swell.     REVIEW OF SYSTEMS:  A 14-point review of systems is positive for hypertension for 10 years and continues on 1 medication of Norvasc.  She does have muscle cramps at times and does use an antispasmodic, but she is not sure exactly what that is.  She has had hemorrhoids and hematochezia.  Nocturia x3.     FAMILY HISTORY:  Positive for a mother who died at age 38 from stress.  She did have a history of heart disease and gout as well as seizures and arthritis.  Father who died  at 74 from COPD as well as cancer, prostate and colon.  He did have cardiac disease also.  She has had 3 brothers who are deceased, one from cancer and a crack addict, and the other 2 had issues with alcoholism.  She has 1 brother who is living at 24, who is also an alcoholic.  She has 2 sisters who are healthy at age 82 and 55.    SOCIAL HISTORY:  She is a 69 year old [place] American female who is widowed.  She is retired from [employer] in [place].  She still smokes cigarettes at 2 packs per week.  She smoked a total of 30 years.     PHYSICAL EXAMINATION:   Jessica Hoover is a 69 year old African American female who is well-developed, well-nourished, alert, pleasant and cooperative, in moderate distress secondary to bilateral hip and groin pain, right greater than left.   Height 66 inches, weight 173 pounds, BMI 27.9.  Temperature 97.4, pulse 75, respirations 14, blood pressure 144/69.   Head is normocephalic.  Eyes:  Pupils equal, round and reactive to light and accommodation with extraocular movements intact.   Ears, nose and throat were benign.   Chest had good expansion.  Lungs had bilateral coarse breath sounds with an occasional inspiratory wheeze.   Neck was supple and no carotid bruits  were noted.   Cardiac:  Regular rhythm and rate.  Normal S1, S2.  No murmurs apparent.   Pulses 1+ bilateral and symmetric in the lower extremities.  Abdomen scaphoid soft, nontender.  No masses palpable.  Normal bowel sounds present.   Breast, genital, rectal exam not indicated for an orthopedic procedure.  CNS:  Oriented x3.  Cranial nerves II-XII grossly intact.   Musculoskeletal:  She can flex to 95 degrees.  Extension:  She still lacks maybe about 5 degrees shy of full extension.  She only has about 5 degrees of internal and external rotation of the right hip.  No pedal edema.     RADIOGRAPHS:  Right hip reveals marked degenerative changes with superior narrowing of the joint space.  She has  a very large cyst in the medial aspect of the femoral head.  Periarticular spurring is noted.     CLINICAL IMPRESSION:   1.  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  2.  History of hypertension.  3.  Cigarette smoker.    RECOMMENDATIONS:  At this time, I have reviewed documentation from [facility], who feels that she is a candidate for surgery from a medical standpoint.  Therefore, our plan is now to proceed with a right total hip arthroplasty in the very near future.  Procedure, risks and benefits were fully explained to the patient and she is understanding.  All questions were answered.  Our plan will be to proceed with a total hip replacement.   Oris Drone Aleda Grana [facility] [phone]  10/25/2014 10:46 AM

## 2014-10-25 MED ORDER — ACETAMINOPHEN 10 MG/ML IV SOLN
1000.0000 mg | Freq: Once | INTRAVENOUS | Status: AC
  Administered 2014-10-26: 1000 mg via INTRAVENOUS
  Filled 2014-10-25: qty 100

## 2014-10-25 MED ORDER — SODIUM CHLORIDE 0.9 % IV SOLN
75.0000 mL/h | INTRAVENOUS | Status: DC
Start: 2014-10-25 — End: 2014-10-26

## 2014-10-25 MED ORDER — CHLORHEXIDINE GLUCONATE 4 % EX LIQD: 60.0000 mL | Freq: Once | CUTANEOUS | Status: DC

## 2014-10-25 MED ORDER — VANCOMYCIN HCL IN DEXTROSE 1-5 GM/200ML-% IV SOLN
1000.0000 mg | INTRAVENOUS | Status: AC
  Administered 2014-10-26: 1000 mg via INTRAVENOUS
  Filled 2014-10-25: qty 200

## 2014-10-26 ENCOUNTER — Inpatient Hospital Stay (HOSPITAL_COMMUNITY): Payer: Medicare HMO | Admitting: Anesthesiology

## 2014-10-26 ENCOUNTER — Encounter (HOSPITAL_COMMUNITY): Payer: Self-pay | Admitting: *Deleted

## 2014-10-26 ENCOUNTER — Inpatient Hospital Stay (HOSPITAL_COMMUNITY): Payer: Medicare HMO

## 2014-10-26 ENCOUNTER — Encounter (HOSPITAL_COMMUNITY)
Admission: RE | Disposition: A | Discharge status: Home Health | Payer: Self-pay | Source: Ambulatory Visit | Attending: Orthopaedic Surgery

## 2014-10-26 ENCOUNTER — Inpatient Hospital Stay (HOSPITAL_COMMUNITY)
Admission: RE | Admit: 2014-10-26 | Discharge: 2014-10-28 | DRG: 470 | Disposition: A | Discharge status: Home Health | Payer: Medicare HMO | Source: Ambulatory Visit | Attending: Orthopaedic Surgery | Admitting: Orthopaedic Surgery

## 2014-10-26 DIAGNOSIS — I10 Essential (primary) hypertension: Secondary | ICD-10-CM | POA: Diagnosis present

## 2014-10-26 DIAGNOSIS — E876 Hypokalemia: Secondary | ICD-10-CM | POA: Diagnosis not present

## 2014-10-26 DIAGNOSIS — M879 Osteonecrosis, unspecified: Secondary | ICD-10-CM | POA: Diagnosis present

## 2014-10-26 DIAGNOSIS — D62 Acute posthemorrhagic anemia: Secondary | ICD-10-CM | POA: Diagnosis not present

## 2014-10-26 DIAGNOSIS — M1611 Unilateral primary osteoarthritis, right hip: Secondary | ICD-10-CM | POA: Diagnosis present

## 2014-10-26 DIAGNOSIS — R059 Cough, unspecified: Secondary | ICD-10-CM

## 2014-10-26 DIAGNOSIS — Z9889 Other specified postprocedural states: Secondary | ICD-10-CM

## 2014-10-26 DIAGNOSIS — F17210 Nicotine dependence, cigarettes, uncomplicated: Secondary | ICD-10-CM | POA: Diagnosis present

## 2014-10-26 DIAGNOSIS — R05 Cough: Secondary | ICD-10-CM

## 2014-10-26 DIAGNOSIS — Z96649 Presence of unspecified artificial hip joint: Secondary | ICD-10-CM

## 2014-10-26 DIAGNOSIS — M1612 Unilateral primary osteoarthritis, left hip: Secondary | ICD-10-CM

## 2014-10-26 HISTORY — PX: TOTAL HIP ARTHROPLASTY: SHX124

## 2014-10-26 SURGERY — ARTHROPLASTY, HIP, TOTAL,POSTERIOR APPROACH
Anesthesia: General | Site: Hip | Laterality: Right

## 2014-10-26 MED ORDER — ONDANSETRON HCL 4 MG PO TABS: 4.0000 mg | ORAL_TABLET | Freq: Four times a day (QID) | ORAL | Status: DC | PRN

## 2014-10-26 MED ORDER — DEXAMETHASONE SODIUM PHOSPHATE 10 MG/ML IJ SOLN
INTRAMUSCULAR | Status: DC | PRN
  Administered 2014-10-26: 10 mg via INTRAVENOUS

## 2014-10-26 MED ORDER — SODIUM CHLORIDE 0.9 % IR SOLN: Status: DC | PRN

## 2014-10-26 MED ORDER — BUPIVACAINE-EPINEPHRINE (PF) 0.25% -1:200000 IJ SOLN
INTRAMUSCULAR | Status: AC
  Filled 2014-10-26: qty 30

## 2014-10-26 MED ORDER — ONDANSETRON HCL 4 MG/2ML IJ SOLN
INTRAMUSCULAR | Status: AC
  Filled 2014-10-26: qty 2

## 2014-10-26 MED ORDER — PROPOFOL 10 MG/ML IV BOLUS
INTRAVENOUS | Status: DC | PRN
  Administered 2014-10-26: 200 mg via INTRAVENOUS

## 2014-10-26 MED ORDER — ROCURONIUM BROMIDE 100 MG/10ML IV SOLN
INTRAVENOUS | Status: DC | PRN
  Administered 2014-10-26: 40 mg via INTRAVENOUS

## 2014-10-26 MED ORDER — METOCLOPRAMIDE HCL 5 MG/ML IJ SOLN: 5.0000 mg | Freq: Three times a day (TID) | INTRAMUSCULAR | Status: DC | PRN

## 2014-10-26 MED ORDER — AMLODIPINE BESYLATE 10 MG PO TABS
10.0000 mg | ORAL_TABLET | Freq: Every day | ORAL | Status: DC
  Administered 2014-10-27 – 2014-10-28 (×2): 10 mg via ORAL
  Filled 2014-10-26 (×2): qty 1

## 2014-10-26 MED ORDER — LIDOCAINE HCL (CARDIAC) 20 MG/ML IV SOLN
INTRAVENOUS | Status: AC
  Filled 2014-10-26: qty 5

## 2014-10-26 MED ORDER — GLYCOPYRROLATE 0.2 MG/ML IJ SOLN
INTRAMUSCULAR | Status: DC | PRN
  Administered 2014-10-26: 0.4 mg via INTRAVENOUS

## 2014-10-26 MED ORDER — HYDROMORPHONE HCL 1 MG/ML IJ SOLN
INTRAMUSCULAR | Status: AC
  Filled 2014-10-26: qty 1

## 2014-10-26 MED ORDER — KETOROLAC TROMETHAMINE 15 MG/ML IJ SOLN
7.5000 mg | Freq: Four times a day (QID) | INTRAMUSCULAR | Status: AC
  Administered 2014-10-26 – 2014-10-27 (×4): 7.5 mg via INTRAVENOUS
  Filled 2014-10-26 (×3): qty 1

## 2014-10-26 MED ORDER — DEXAMETHASONE SODIUM PHOSPHATE 10 MG/ML IJ SOLN
INTRAMUSCULAR | Status: AC
  Filled 2014-10-26: qty 1

## 2014-10-26 MED ORDER — PROPOFOL 10 MG/ML IV BOLUS
INTRAVENOUS | Status: AC
  Filled 2014-10-26: qty 20

## 2014-10-26 MED ORDER — MAGNESIUM CITRATE PO SOLN: 1.0000 | Freq: Once | ORAL | Status: AC | PRN

## 2014-10-26 MED ORDER — SODIUM CHLORIDE 0.9 % IV SOLN
75.0000 mL/h | INTRAVENOUS | Status: DC
  Administered 2014-10-26: 75 mL/h via INTRAVENOUS

## 2014-10-26 MED ORDER — DIPHENHYDRAMINE HCL 12.5 MG/5ML PO ELIX: 12.5000 mg | ORAL_SOLUTION | ORAL | Status: DC | PRN

## 2014-10-26 MED ORDER — BUPIVACAINE-EPINEPHRINE (PF) 0.25% -1:200000 IJ SOLN
INTRAMUSCULAR | Status: DC | PRN
  Administered 2014-10-26: 30 mL

## 2014-10-26 MED ORDER — NEOSTIGMINE METHYLSULFATE 10 MG/10ML IV SOLN
INTRAVENOUS | Status: AC
  Filled 2014-10-26: qty 1

## 2014-10-26 MED ORDER — VANCOMYCIN HCL IN DEXTROSE 1-5 GM/200ML-% IV SOLN
1000.0000 mg | Freq: Once | INTRAVENOUS | Status: AC
  Administered 2014-10-26: 1000 mg via INTRAVENOUS
  Filled 2014-10-26: qty 200

## 2014-10-26 MED ORDER — SODIUM CHLORIDE 0.9 % IJ SOLN
INTRAMUSCULAR | Status: AC
  Filled 2014-10-26: qty 10

## 2014-10-26 MED ORDER — SUCCINYLCHOLINE CHLORIDE 20 MG/ML IJ SOLN
INTRAMUSCULAR | Status: AC
  Filled 2014-10-26: qty 1

## 2014-10-26 MED ORDER — POLYETHYLENE GLYCOL 3350 17 G PO PACK: 17.0000 g | PACK | Freq: Every day | ORAL | Status: DC | PRN

## 2014-10-26 MED ORDER — OXYCODONE HCL 5 MG PO TABS: 5.0000 mg | ORAL_TABLET | Freq: Once | ORAL | Status: DC | PRN

## 2014-10-26 MED ORDER — GLYCOPYRROLATE 0.2 MG/ML IJ SOLN
INTRAMUSCULAR | Status: AC
  Filled 2014-10-26: qty 2

## 2014-10-26 MED ORDER — EPHEDRINE SULFATE 50 MG/ML IJ SOLN
INTRAMUSCULAR | Status: AC
  Filled 2014-10-26: qty 1

## 2014-10-26 MED ORDER — HYDROMORPHONE HCL 1 MG/ML IJ SOLN: 0.5000 mg | INTRAMUSCULAR | Status: DC | PRN

## 2014-10-26 MED ORDER — PROMETHAZINE HCL 25 MG/ML IJ SOLN: 6.2500 mg | INTRAMUSCULAR | Status: DC | PRN

## 2014-10-26 MED ORDER — OXYCODONE HCL 5 MG/5ML PO SOLN: 5.0000 mg | Freq: Once | ORAL | Status: DC | PRN

## 2014-10-26 MED ORDER — FENTANYL CITRATE (PF) 100 MCG/2ML IJ SOLN
INTRAMUSCULAR | Status: DC | PRN
  Administered 2014-10-26 (×3): 50 ug via INTRAVENOUS
  Administered 2014-10-26: 100 ug via INTRAVENOUS

## 2014-10-26 MED ORDER — BISACODYL 10 MG RE SUPP: 10.0000 mg | Freq: Every day | RECTAL | Status: DC | PRN

## 2014-10-26 MED ORDER — HYDROMORPHONE HCL 1 MG/ML IJ SOLN
0.2500 mg | INTRAMUSCULAR | Status: DC | PRN
  Administered 2014-10-26 (×4): 0.5 mg via INTRAVENOUS

## 2014-10-26 MED ORDER — MENTHOL 3 MG MT LOZG: 1.0000 | LOZENGE | OROMUCOSAL | Status: DC | PRN

## 2014-10-26 MED ORDER — HYDROMORPHONE HCL 2 MG PO TABS
2.0000 mg | ORAL_TABLET | ORAL | Status: DC | PRN
  Administered 2014-10-27 – 2014-10-28 (×2): 2 mg via ORAL
  Filled 2014-10-26 (×2): qty 1

## 2014-10-26 MED ORDER — KETOROLAC TROMETHAMINE 30 MG/ML IJ SOLN: 30.0000 mg | Freq: Once | INTRAMUSCULAR | Status: DC | PRN

## 2014-10-26 MED ORDER — ONDANSETRON HCL 4 MG/2ML IJ SOLN
INTRAMUSCULAR | Status: DC | PRN
  Administered 2014-10-26: 4 mg via INTRAVENOUS

## 2014-10-26 MED ORDER — ONDANSETRON HCL 4 MG/2ML IJ SOLN
4.0000 mg | Freq: Four times a day (QID) | INTRAMUSCULAR | Status: DC | PRN
  Administered 2014-10-26: 4 mg via INTRAVENOUS
  Filled 2014-10-26: qty 2

## 2014-10-26 MED ORDER — KETOROLAC TROMETHAMINE 15 MG/ML IJ SOLN
INTRAMUSCULAR | Status: AC
  Filled 2014-10-26: qty 1

## 2014-10-26 MED ORDER — LIDOCAINE HCL (CARDIAC) 20 MG/ML IV SOLN
INTRAVENOUS | Status: DC | PRN
  Administered 2014-10-26: 60 mg via INTRAVENOUS

## 2014-10-26 MED ORDER — ALUM & MAG HYDROXIDE-SIMETH 200-200-20 MG/5ML PO SUSP: 30.0000 mL | ORAL | Status: DC | PRN

## 2014-10-26 MED ORDER — PHENOL 1.4 % MT LIQD: 1.0000 | OROMUCOSAL | Status: DC | PRN

## 2014-10-26 MED ORDER — NEOSTIGMINE METHYLSULFATE 10 MG/10ML IV SOLN
INTRAVENOUS | Status: DC | PRN
  Administered 2014-10-26: 3 mg via INTRAVENOUS

## 2014-10-26 MED ORDER — SODIUM CHLORIDE 0.9 % IR SOLN
Status: DC | PRN
  Administered 2014-10-26: 1000 mL

## 2014-10-26 MED ORDER — METHOCARBAMOL 500 MG PO TABS
500.0000 mg | ORAL_TABLET | Freq: Four times a day (QID) | ORAL | Status: DC | PRN
  Administered 2014-10-27 – 2014-10-28 (×2): 500 mg via ORAL
  Filled 2014-10-26 (×3): qty 1

## 2014-10-26 MED ORDER — FENTANYL CITRATE (PF) 250 MCG/5ML IJ SOLN
INTRAMUSCULAR | Status: AC
  Filled 2014-10-26: qty 5

## 2014-10-26 MED ORDER — OXYCODONE HCL 5 MG PO TABS
ORAL_TABLET | ORAL | Status: AC
  Administered 2014-10-26: 5 mg
  Filled 2014-10-26: qty 1

## 2014-10-26 MED ORDER — ROCURONIUM BROMIDE 50 MG/5ML IV SOLN
INTRAVENOUS | Status: AC
  Filled 2014-10-26: qty 1

## 2014-10-26 MED ORDER — ACETAMINOPHEN 10 MG/ML IV SOLN
1000.0000 mg | Freq: Four times a day (QID) | INTRAVENOUS | Status: DC
  Administered 2014-10-26 – 2014-10-27 (×3): 1000 mg via INTRAVENOUS
  Filled 2014-10-26 (×3): qty 100

## 2014-10-26 MED ORDER — DOCUSATE SODIUM 100 MG PO CAPS
100.0000 mg | ORAL_CAPSULE | Freq: Two times a day (BID) | ORAL | Status: DC
  Administered 2014-10-26 – 2014-10-28 (×4): 100 mg via ORAL
  Filled 2014-10-26 (×3): qty 1

## 2014-10-26 MED ORDER — METOCLOPRAMIDE HCL 5 MG PO TABS
5.0000 mg | ORAL_TABLET | Freq: Three times a day (TID) | ORAL | Status: DC | PRN
  Administered 2014-10-26: 10 mg via ORAL
  Filled 2014-10-26: qty 2

## 2014-10-26 MED ORDER — EPHEDRINE SULFATE 50 MG/ML IJ SOLN
INTRAMUSCULAR | Status: DC | PRN
  Administered 2014-10-26 (×3): 5 mg via INTRAVENOUS

## 2014-10-26 MED ORDER — LACTATED RINGERS IV SOLN
INTRAVENOUS | Status: DC | PRN
  Administered 2014-10-26 (×2): via INTRAVENOUS

## 2014-10-26 MED ORDER — RIVAROXABAN 10 MG PO TABS
10.0000 mg | ORAL_TABLET | Freq: Every day | ORAL | Status: DC
Start: 2014-10-27 — End: 2014-10-28
  Administered 2014-10-27 – 2014-10-28 (×2): 10 mg via ORAL
  Filled 2014-10-26 (×2): qty 1

## 2014-10-26 MED ORDER — DEXTROSE 5 % IV SOLN
500.0000 mg | Freq: Four times a day (QID) | INTRAVENOUS | Status: DC | PRN
  Administered 2014-10-26: 500 mg via INTRAVENOUS
  Filled 2014-10-26 (×3): qty 5

## 2014-10-26 SURGICAL SUPPLY — 61 items
BLADE SAW SAG 73X25 THK (BLADE) ×2
BLADE SAW SGTL 73X25 THK (BLADE) ×1 IMPLANT
BRUSH FEMORAL CANAL (MISCELLANEOUS) IMPLANT
CAPT HIP TOTAL 2 ×2 IMPLANT
COVER SURGICAL LIGHT HANDLE (MISCELLANEOUS) ×3 IMPLANT
DRAPE INCISE IOBAN 66X45 STRL (DRAPES) ×2 IMPLANT
DRAPE ORTHO SPLIT 77X108 STRL (DRAPES) ×6
DRAPE SURG ORHT 6 SPLT 77X108 (DRAPES) ×2 IMPLANT
DRSG MEPILEX BORDER 4X12 (GAUZE/BANDAGES/DRESSINGS) ×3 IMPLANT
DURAPREP 26ML APPLICATOR (WOUND CARE) ×6 IMPLANT
ELECT BLADE 4.0 EZ CLEAN MEGAD (MISCELLANEOUS) ×3
ELECT BLADE 6.5 EXT (BLADE) IMPLANT
ELECT REM PT RETURN 9FT ADLT (ELECTROSURGICAL) ×3
ELECTRODE BLDE 4.0 EZ CLN MEGD (MISCELLANEOUS) IMPLANT
ELECTRODE REM PT RTRN 9FT ADLT (ELECTROSURGICAL) ×1 IMPLANT
EVACUATOR 1/8 PVC DRAIN (DRAIN) IMPLANT
FACESHIELD WRAPAROUND (MASK) ×9 IMPLANT
FACESHIELD WRAPAROUND OR TEAM (MASK) ×3 IMPLANT
GLOVE BIOGEL PI IND STRL 8 (GLOVE) ×2 IMPLANT
GLOVE BIOGEL PI IND STRL 8.5 (GLOVE) ×1 IMPLANT
GLOVE BIOGEL PI INDICATOR 8 (GLOVE) ×4
GLOVE BIOGEL PI INDICATOR 8.5 (GLOVE) ×2
GLOVE ECLIPSE 8.0 STRL XLNG CF (GLOVE) ×8 IMPLANT
GLOVE SURG ORTHO 8.5 STRL (GLOVE) ×8 IMPLANT
GOWN STRL REUS W/ TWL LRG LVL3 (GOWN DISPOSABLE) ×2 IMPLANT
GOWN STRL REUS W/ TWL XL LVL3 (GOWN DISPOSABLE) IMPLANT
GOWN STRL REUS W/TWL 2XL LVL3 (GOWN DISPOSABLE) ×4 IMPLANT
GOWN STRL REUS W/TWL LRG LVL3 (GOWN DISPOSABLE) ×3
GOWN STRL REUS W/TWL XL LVL3 (GOWN DISPOSABLE) ×3
HANDPIECE INTERPULSE COAX TIP (DISPOSABLE)
IMMOBILIZER KNEE 20 (SOFTGOODS) IMPLANT
IMMOBILIZER KNEE 22 UNIV (SOFTGOODS) ×3 IMPLANT
KIT BASIN OR (CUSTOM PROCEDURE TRAY) ×3 IMPLANT
KIT ROOM TURNOVER OR (KITS) ×3 IMPLANT
MANIFOLD NEPTUNE II (INSTRUMENTS) ×3 IMPLANT
NEEDLE 22X1 1/2 (OR ONLY) (NEEDLE) ×3 IMPLANT
NS IRRIG 1000ML POUR BTL (IV SOLUTION) ×3 IMPLANT
PACK TOTAL JOINT (CUSTOM PROCEDURE TRAY) ×3 IMPLANT
PACK UNIVERSAL I (CUSTOM PROCEDURE TRAY) ×3 IMPLANT
PAD ARMBOARD 7.5X6 YLW CONV (MISCELLANEOUS) ×5 IMPLANT
PRESSURIZER FEMORAL UNIV (MISCELLANEOUS) IMPLANT
SET HNDPC FAN SPRY TIP SCT (DISPOSABLE) IMPLANT
SPONGE LAP 18X18 X RAY DECT (DISPOSABLE) ×2 IMPLANT
STAPLER VISISTAT 35W (STAPLE) ×2 IMPLANT
SUCTION FRAZIER TIP 10 FR DISP (SUCTIONS) ×3 IMPLANT
SUT BONE WAX W31G (SUTURE) IMPLANT
SUT ETHIBOND NAB CT1 #1 30IN (SUTURE) ×9 IMPLANT
SUT MNCRL AB 3-0 PS2 18 (SUTURE) ×3 IMPLANT
SUT VIC AB 0 CT1 27 (SUTURE) ×6
SUT VIC AB 0 CT1 27XBRD ANBCTR (SUTURE) ×2 IMPLANT
SUT VIC AB 1 CT1 27 (SUTURE) ×6
SUT VIC AB 1 CT1 27XBRD ANBCTR (SUTURE) ×2 IMPLANT
SUT VIC AB 2-0 CT1 27 (SUTURE) ×3
SUT VIC AB 2-0 CT1 TAPERPNT 27 (SUTURE) ×1 IMPLANT
SYR CONTROL 10ML LL (SYRINGE) ×3 IMPLANT
TOWEL OR 17X24 6PK STRL BLUE (TOWEL DISPOSABLE) ×3 IMPLANT
TOWEL OR 17X26 10 PK STRL BLUE (TOWEL DISPOSABLE) ×3 IMPLANT
TOWER CARTRIDGE SMART MIX (DISPOSABLE) IMPLANT
WATER STERILE IRR 1000ML POUR (IV SOLUTION) ×4 IMPLANT
WRAP KNEE MAXI GEL POST OP (GAUZE/BANDAGES/DRESSINGS) ×2 IMPLANT
YANKAUER SUCT BULB TIP NO VENT (SUCTIONS) ×2 IMPLANT

## 2014-10-26 NOTE — Transfer of Care (Signed)
Immediate Anesthesia Transfer of Care Note  Patient: Jessica Hoover  Procedure(s) Performed: Procedure(s): TOTAL HIP ARTHROPLASTY (Right)  Patient Location: PACU  Anesthesia Type:General  Level of Consciousness: awake, alert  and patient cooperative  Airway & Oxygen Therapy: Patient Spontanous Breathing and Patient connected to nasal cannula oxygen  Post-op Assessment: Report given to RN and Post -op Vital signs reviewed and stable  Post vital signs: Reviewed and stable  Last Vitals:  Filed Vitals:   10/26/14 0547  BP: 162/73  Pulse: 74  Temp: 36.7 C  Resp: 20    Complications: No apparent anesthesia complications

## 2014-10-26 NOTE — Progress Notes (Signed)
PT Cancellation Note  Patient Details Name: Jessica Hoover MRN: [id number] DOB: 05/23/1945   Cancelled Treatment:    Reason Eval/Treat Not Completed: Fatigue/lethargy limiting ability to participate;Other (comment) Pt lethargic, nauseous and actively vomiting per RN. RN requests holding PT evaluation until tomorrow. Will follow up next available time.  Blake Divine A Alexsa Flaum 10/26/2014, 3:48 PM Mylo Red, PT, DPT [phone]

## 2014-10-26 NOTE — Progress Notes (Signed)
Utilization review completed.  

## 2014-10-26 NOTE — Op Note (Signed)
PATIENT ID:      Jessica Hoover  MRN:     [id number] DOB/AGE:    27-Feb-1946 / 69 y.o.       OPERATIVE REPORT    DATE OF PROCEDURE:  10/26/2014       PREOPERATIVE DIAGNOSIS: END STAGE, PRIMARY  RIGHT HIP OSTEOARTHRITIS                                                       Estimated body mass index is 26.16 kg/(m^2) as calculated from the following:   Height as of this encounter:  (1.727 m).   Weight as of this encounter: 78.019 kg (172 lb).     POSTOPERATIVE DIAGNOSIS:   RIGHT HIP OSTEOARTHRITIS-SAME                                                                     Estimated body mass index is 26.16 kg/(m^2) as calculated from the following:   Height as of this encounter:  (1.727 m).   Weight as of this encounter: 78.019 kg (172 lb).     PROCEDURE:  Procedure(s):RIGHT TOTAL HIP ARTHROPLASTY      SURGEON:  [name], MD    ASSISTANT:   [name], PA-C   (Present and scrubbed throughout the case, critical for assistance with exposure, retraction, instrumentation, and closure.)          ANESTHESIA: general     DRAINS: none :      TOURNIQUET TIME: NONE   COMPLICATIONS:  None   CONDITION:  stable  PROCEDURE IN DETAIL: [id number]   Jessica Hoover W 10/26/2014, 9:12 AM

## 2014-10-26 NOTE — Anesthesia Preprocedure Evaluation (Addendum)
Anesthesia Evaluation  Patient identified by MRN, date of birth, ID band Patient awake    Reviewed: Allergy & Precautions, NPO status , Patient's Chart, lab work & pertinent test results  History of Anesthesia Complications (+) PONV  Airway Mallampati: III  TM Distance: <3 FB     Dental  (+) Dental Advisory Given   Pulmonary Current Smoker,  breath sounds clear to auscultation        Cardiovascular hypertension, Pt. on medications Rhythm:Regular Rate:Normal     Neuro/Psych negative neurological ROS     GI/Hepatic negative GI ROS, Neg liver ROS,   Endo/Other  negative endocrine ROS  Renal/GU negative Renal ROS     Musculoskeletal  (+) Arthritis -,   Abdominal   Peds  Hematology negative hematology ROS (+)   Anesthesia Other Findings   Reproductive/Obstetrics                            Anesthesia Physical Anesthesia Plan  ASA: II  Anesthesia Plan: General   Post-op Pain Management:    Induction: Intravenous  Airway Management Planned: Oral ETT  Additional Equipment:   Intra-op Plan:   Post-operative Plan: Extubation in OR  Informed Consent: I have reviewed the patients History and Physical, chart, labs and discussed the procedure including the risks, benefits and alternatives for the proposed anesthesia with the patient or authorized representative who has indicated his/her understanding and acceptance.   Dental advisory given  Plan Discussed with: CRNA  Anesthesia Plan Comments:        Anesthesia Quick Evaluation

## 2014-10-26 NOTE — Op Note (Signed)
NAMEAMIRIA, ORRISON                   ACCOUNT NO.:  [id number]  MEDICAL RECORD NO.:  [id number]  LOCATION:  5N20C                        FACILITY:  [facility]  PHYSICIAN:  Vonna Kotyk. Whitfield, M.D.DATE OF BIRTH:  Jun 05, 1945  DATE OF PROCEDURE:  10/26/2014 DATE OF DISCHARGE:                              OPERATIVE REPORT   PREOPERATIVE DIAGNOSIS:  Primary, end-stage osteoarthritis, right hip.  POSTOPERATIVE DIAGNOSIS:  Primary, end-stage osteoarthritis, right hip.  PROCEDURE:  Right total hip replacement.  SURGEON:  Vonna Kotyk. Durward Fortes, M.D.  ASSISTANT:  Aaron Edelman D. Petrarca, P.A.-C.  ANESTHESIA:  General.  COMPLICATIONS:  None.  COMPONENTS:  DePuy AML 12-mm small-stature femoral component, 52-mm outer diameter Gription 3 acetabular cup with a single 6.5-mm wide, 30- mm long acetabular screw, apex hole eliminator and a Marathon polyethylene liner +4 with a 10-degree posterior lip.  Components were press-fit.  DESCRIPTION OF PROCEDURE:  Ms. Haigh was met in the holding area with her daughter, identified the right hip as appropriate operative site and marked it accordingly.  Any questions were answered.  Mrs. Winker was then transported to room #7 and placed under general anesthesia without difficulty.  Nursing staff inserted a Foley catheter. Urine was clear.  The patient was then placed in the lateral decubitus position with the right side up and secured to the operating table with the Innomed hip system.  Time-out was called.  The right hip was then prepped from iliac crest to the ankle with chlorhexidine scrub and then DuraPrep x2.  Sterile draping was performed.  A routine Southern incision was utilized and via sharp dissection, carried down to the subcutaneous tissue.  Gross bleeders were Bovie coagulated.  Adipose tissue was incised at the level of the iliotibial band.  Self-retaining retractors were placed more deeply.  The IT band was then incised along the length of the  skin incision.  At that point, the short external rotators were identified.  Tendinous structures were tagged with 0 Ethibond suture.  The capsule was identified and incised along the femoral neck and head.  The joint was entered.  There was a small clear yellow joint effusion and beefy red synovitis.  The head was then dislocated posteriorly.  Using the calcar guide and oscillating saw, the head was then amputated at the head-neck junction. The head was delivered from the wound.  There were large osteophytes around the periphery of the femoral head with near complete loss of articular cartilage superiorly.  Retractors were then placed about the proximal femur.  The piriformis fossa was identified.  The center hole was then made.  Hand reaming was performed to 8 mm and then power reaming was performed to 11.5 mm to accept a 12 mm component.  Rasping was performed sequentially from 10.5 to 12 mm component, had nice fit.  I did use a calcar cutter to obtain the appropriate angle.  The cut was about a fingerbreadth proximal to the lesser trochanter.  Retractors were then placed about the acetabulum.  I inserted a wing retractor to visualize the acetabulum.  The labrum was sharply excised. I carefully palpated and protected the sciatic nerve throughout the procedure.  The reaming was  performed sequentially to 51 mm to accept a 52 component, I trialed a 50 and 52 trial, the 50 would completely seat with a good rim fit, 52 would not seat.  The final Gription 3 metallic acetabular component was then impacted into place.  I had good bleeding bone, but I elected to use a single acetabular screw, this was appropriately drilled, measured and filled with a 6.5-mm screw.  The trial of the polyethylene component was then screwed in place followed by the 12.5 small-stature femoral rasp.  We tried the 1.5-mm neck length on the 36-mm head, but we could not reduce it.  So, we then used a -2 head.   This would reduce nicely.  There was no toggling, could not dislocate the hip.  I thought we had excellent position, it was not tight posteriorly and leg lengths appeared remained symmetrical.  The trial components were removed.  The joint was copiously irrigated with saline solution.  Apex hole eliminator was screwed into the acetabulum followed by the impacted Marathon polyethylene liner.  The wound was again irrigated with saline solution.  The femoral component was then impacted in the place, i.e. the 12.5 small-stature AML component.  We cleaned the [facility] taper neck and inserted the final metallic 20-NO outer diameter femoral head with a -2 mm neck length.  We cleaned the acetabulum.  We then reduced the hip through a full range of motion.  There was no instability.  Sciatic nerve appeared to be well protected.  The wound was irrigated with saline solution.  We injected the deep capsule with 0.25% Marcaine without epinephrine.  The capsule was closed anatomically with 0 Ethibond suture.  The short external rotator was closed with the same material.  The wound was again irrigated with saline solution.  The ITB band was closed with a running #1 Vicryl, subcu in several layers with 2-0 Vicryl, 3-0 Monocryl, and then skin clips.  Sterile bulky dressing was applied.  The patient was then placed in supine position.  Knee immobilizer was placed to the right lower extremity.  The patient woken, transferred to the postop stretcher and returned to the PACU without difficulty.     Vonna Kotyk. Durward Fortes, M.D.     PWW/MEDQ  D:  10/26/2014  T:  10/26/2014  Job:  [id number]

## 2014-10-26 NOTE — H&P (Signed)
  The recent History & Physical has been reviewed. I have personally examined the patient today. There is no interval change to the documented History & Physical. The patient would like to proceed with the procedure.  [name] W 10/26/2014,  7:10 AM

## 2014-10-26 NOTE — Discharge Instructions (Signed)
Information on my medicine - XARELTO® (Rivaroxaban) ° °This medication education was reviewed with me or my healthcare representative as part of my discharge preparation.  The pharmacist that spoke with me during my hospital stay was:  Ohana Birdwell K, RPH ° °Why was Xarelto® prescribed for you? °Xarelto® was prescribed for you to reduce the risk of blood clots forming after orthopedic surgery. The medical term for these abnormal blood clots is venous thromboembolism (VTE). ° °What do you need to know about xarelto® ? °Take your Xarelto® ONCE DAILY at the same time every day. °You may take it either with or without food. ° °If you have difficulty swallowing the tablet whole, you may crush it and mix in applesauce just prior to taking your dose. ° °Take Xarelto® exactly as prescribed by your doctor and DO NOT stop taking Xarelto® without talking to the doctor who prescribed the medication.  Stopping without other VTE prevention medication to take the place of Xarelto® may increase your risk of developing a clot. ° °After discharge, you should have regular check-up appointments with your healthcare provider that is prescribing your Xarelto®.   ° °What do you do if you miss a dose? °If you miss a dose, take it as soon as you remember on the same day then continue your regularly scheduled once daily regimen the next day. Do not take two doses of Xarelto® on the same day.  ° °Important Safety Information °A possible side effect of Xarelto® is bleeding. You should call your healthcare provider right away if you experience any of the following: °? Bleeding from an injury or your nose that does not stop. °? Unusual colored urine (red or dark brown) or unusual colored stools (red or black). °? Unusual bruising for unknown reasons. °? A serious fall or if you hit your head (even if there is no bleeding). ° °Some medicines may interact with Xarelto® and might increase your risk of bleeding while on Xarelto®. To help avoid  this, consult your healthcare provider or pharmacist prior to using any new prescription or non-prescription medications, including herbals, vitamins, non-steroidal anti-inflammatory drugs (NSAIDs) and supplements. ° °This website has more information on Xarelto®: www.xarelto.com. ° ° ° °

## 2014-10-26 NOTE — Anesthesia Postprocedure Evaluation (Signed)
  Anesthesia Post-op Note  Patient: Jessica Hoover  Procedure(s) Performed: Procedure(s): TOTAL HIP ARTHROPLASTY (Right)  Patient Location: PACU  Anesthesia Type:General  Level of Consciousness: awake, alert  and oriented  Airway and Oxygen Therapy: Patient Spontanous Breathing  Post-op Pain: mild  Post-op Assessment: Post-op Vital signs reviewed     RLE Motor Response: Responds to commands RLE Sensation: Full sensation      Post-op Vital Signs: Reviewed  Last Vitals:  Filed Vitals:   10/26/14 1115  BP: 129/62  Pulse: 78  Temp: 36.4 C  Resp: 16    Complications: No apparent anesthesia complications

## 2014-10-26 NOTE — Anesthesia Procedure Notes (Signed)
Procedure Name: Intubation Date/Time: 10/26/2014 7:22 AM Performed by: Arlice Colt B Pre-anesthesia Checklist: Patient identified, Emergency Drugs available, Suction available, Patient being monitored and Timeout performed Patient Re-evaluated:Patient Re-evaluated prior to inductionOxygen Delivery Method: Circle system utilized Preoxygenation: Pre-oxygenation with 100% oxygen Intubation Type: IV induction Ventilation: Mask ventilation without difficulty and Oral airway inserted - appropriate to patient size Laryngoscope Size: Mac and 3 Grade View: Grade III Tube type: Oral Tube size: 7.5 mm Number of attempts: 1 Airway Equipment and Method: Stylet Placement Confirmation: ETT inserted through vocal cords under direct vision,  positive ETCO2 and breath sounds checked- equal and bilateral Secured at: 21 cm Tube secured with: Tape Dental Injury: Teeth and Oropharynx as per pre-operative assessment

## 2014-10-27 ENCOUNTER — Encounter (HOSPITAL_COMMUNITY): Payer: Self-pay | Admitting: Orthopaedic Surgery

## 2014-10-27 LAB — BASIC METABOLIC PANEL
ANION GAP: 9 (ref 5–15)
BUN: 11 mg/dL (ref 6–20)
CO2: 24 mmol/L (ref 22–32)
CREATININE: 1.01 mg/dL — AB (ref 0.44–1.00)
Calcium: 9.1 mg/dL (ref 8.9–10.3)
Chloride: 105 mmol/L (ref 101–111)
GFR calc Af Amer: >60 mL/min (ref >60)
GFR, EST NON AFRICAN AMERICAN: 55 mL/min — AB (ref >60)
GLUCOSE: 122 mg/dL — AB (ref 65–99)
Potassium: 3.5 mmol/L (ref 3.5–5.1)
Sodium: 138 mmol/L (ref 135–145)

## 2014-10-27 LAB — CBC
HEMATOCRIT: 30.5 % — AB (ref 36.0–46.0)
HEMOGLOBIN: 10.1 g/dL — AB (ref 12.0–15.0)
MCH: 27.8 pg (ref 26.0–34.0)
MCHC: 33.1 g/dL (ref 30.0–36.0)
MCV: 84 fL (ref 78.0–100.0)
Platelets: 292 10*3/uL (ref 150–400)
RBC: 3.63 MIL/uL — AB (ref 3.87–5.11)
RDW: 13 % (ref 11.5–15.5)
WBC: 9 10*3/uL (ref 4.0–10.5)

## 2014-10-27 MED ORDER — ACETAMINOPHEN 500 MG PO TABS
1000.0000 mg | ORAL_TABLET | Freq: Four times a day (QID) | ORAL | Status: AC
  Administered 2014-10-27: 1000 mg via ORAL
  Filled 2014-10-27: qty 2

## 2014-10-27 NOTE — Evaluation (Signed)
Occupational Therapy Evaluation Patient Details Name: Jessica Hoover MRN: [id number] DOB: [birth year] Today's Date: 10/27/2014    History of Present Illness Pt is a 69 y/o F s/p R THA.  Pt's PMH includes HTN, chronic shoulder, pelvic, and back pain, sciatica, and frequent urination.   Clinical Impression   Patient presenting with deconditioning and decreased ADL & functional mobility independence secondary to above. Patient independent PTA. Patient currently requires up to total assist for LB ADLs and min guard/assist for functional mobility. Patient will benefit from acute OT to increase overall independence in the areas of ADLs, functional mobility, and overall safety in order to safely discharge home with family.     Follow Up Recommendations  No OT follow up;Supervision - Intermittent    Equipment Recommendations  3 in 1 bedside comode    Recommendations for Other Services  None at this time   Precautions / Restrictions Precautions Precautions: Posterior Hip;Fall Precaution Booklet Issued: Yes (comment) Precaution Comments: Reviewed post hip precautions Required Braces or Orthoses: Knee Immobilizer - Right Knee Immobilizer - Right: Other (comment) (not stated in orders) Restrictions Weight Bearing Restrictions: Yes RLE Weight Bearing: Weight bearing as tolerated    Mobility Bed Mobility Overal bed mobility: Needs Assistance Bed Mobility: Sit to Supine     Supine to sit: Min assist Sit to supine: Min guard   General bed mobility comments: Min guard for safety. Mod verbal cues to adhere to hip precautions   Transfers Overall transfer level: Needs assistance Equipment used: Rolling walker (2 wheeled) Transfers: Sit to/from Stand Sit to Stand: Min guard         General transfer comment: Min guard for safety.     Balance Overall balance assessment: Needs assistance Sitting-balance support: No upper extremity supported;Feet supported Sitting balance-Leahy Scale:  Good     Standing balance support: Bilateral upper extremity supported;During functional activity Standing balance-Leahy Scale: Fair Standing balance comment: Pt able to stand at sink and wash hands w/o either UE supported.    ADL Overall ADL's : Needs assistance/impaired              General ADL Comments: Pt overall total assist for LB ADLs secondary to posterior hip precautions; handout regarding hip kit given to patient. Pt ambualted <> BR using BSC over toilet seat with min guard assist. Pt then engaged in bed mobility to get back to bed. Throughout session, pt required mod verbal cues to adhere to hip precautions.     Pertinent Vitals/Pain Pain Assessment: 0-10 Pain Score: 2  Pain Location: R hip Pain Descriptors / Indicators: Sore Pain Intervention(s): Monitored during session;Repositioned     Hand Dominance Right   Extremity/Trunk Assessment Upper Extremity Assessment Upper Extremity Assessment: Overall WFL for tasks assessed   Lower Extremity Assessment Lower Extremity Assessment: Defer to PT evaluation RLE Deficits / Details: weakness and limited ROM s/p R THA RLE Sensation: decreased light touch   Cervical / Trunk Assessment Cervical / Trunk Assessment: Normal   Communication Communication Communication: No difficulties   Cognition Arousal/Alertness: Awake/alert Behavior During Therapy: WFL for tasks assessed/performed Overall Cognitive Status: Within Functional Limits for tasks assessed       Memory: Decreased recall of precautions             Home Living Family/patient expects to be discharged to:: Private residence Living Arrangements: Alone Available Help at Discharge: Family;Available 24 hours/day Type of Home: House Home Access: Stairs to enter [employer] of Steps: 3 Entrance Stairs-Rails: Right Home Layout:  Two level;Able to live on main level with bedroom/bathroom     Bathroom Shower/Tub: Tub/shower unit;Curtain    Bathroom Toilet: Handicapped height     Home Equipment: Environmental consultant - 2 wheels;Cane - single point;Crutches;Walker - 4 wheels          Prior Functioning/Environment Level of Independence: Independent with assistive device(s)        Comments: Was using rollator when leaving her home    OT Diagnosis: Generalized weakness;Acute pain   OT Problem List: Decreased strength;Decreased range of motion;Decreased activity tolerance;Impaired balance (sitting and/or standing);Decreased safety awareness;Decreased knowledge of use of DME or AE;Decreased knowledge of precautions;Pain   OT Treatment/Interventions: Self-care/ADL training;Therapeutic exercise;Energy conservation;DME and/or AE instruction;Therapeutic activities;Patient/family education;Balance training    OT Goals(Current goals can be found in the care plan section) Acute Rehab OT Goals Patient Stated Goal: to get stronger and go home tomorrow OT Goal Formulation: With patient Time For Goal Achievement: 11/10/14 Potential to Achieve Goals: Good ADL Goals Pt Will Perform Grooming: with modified independence;standing Pt Will Perform Lower Body Bathing: with modified independence;sit to/from stand;with adaptive equipment Pt Will Perform Lower Body Dressing: with modified independence;sit to/from stand;with adaptive equipment Pt Will Transfer to Toilet: with modified independence;ambulating;bedside commode Pt Will Perform Toileting - Clothing Manipulation and hygiene: with modified independence;sit to/from stand Pt Will Perform Tub/Shower Transfer: Tub transfer;with supervision;rolling walker;3 in 1;ambulating Additional ADL Goal #1: Pt will independently verbalize and adhere to 3/3 posterior hip precautions   OT Frequency: Min 2X/week   Barriers to D/C: None known at this time   End of Session Equipment Utilized During Treatment: Rolling walker;Right knee immobilizer  Activity Tolerance: Patient tolerated treatment well Patient left:  in bed;with call bell/phone within reach   Time: 1339-1358 OT Time Calculation (min): 19 min Charges:  OT General Charges $OT Visit: 1 Procedure OT Evaluation $Initial OT Evaluation Tier I: 1 Procedure  Jessica Hoover , MS, OTR/L, CLT Pager: [phone]  10/27/2014, 2:06 PM

## 2014-10-27 NOTE — Care Management Note (Signed)
Case Management Note  Patient Details  Name: Jessica Hoover MRN: [id number] Date of Birth: 22-Apr-1945  Subjective/Objective:          S/p left total hip arthroplasty          Action/Plan: Spoke with patient about home health, she selected [facility]. Contacted [facility] at [facility] and set up HHPT. Patient has a rolling walker but will need 3N1. Contacted James at [facility] and requested 3N1 be delivered to patient's room. Patient states that she will have family available to assist after discharge.    Expected Discharge Date:                  Expected Discharge Plan:  Home w Home Health Services  In-House Referral:  NA  Discharge planning Services  CM Consult  Post Acute Care Choice:  Home Health, Durable Medical Equipment Choice offered to:     DME Arranged:  3-N-1 DME Agency:  [facility]  HH Arranged:  PT [facility] Agency:  [facility]  Status of Service:  Completed, signed off  Medicare Important Message Given:    Date Medicare IM Given:    Medicare IM give by:    Date Additional Medicare IM Given:    Additional Medicare Important Message give by:     If discussed at Long Length of Stay Meetings, dates discussed:    Additional Comments:  Monica Becton, RN 10/27/2014, 3:19 PM

## 2014-10-27 NOTE — Evaluation (Signed)
Physical Therapy Evaluation Patient Details Name: Jessica Hoover MRN: [id number] DOB: Aug 07, 1945 Today's Date: 10/27/2014   History of Present Illness  Pt is a 69 y/o F s/p R THA.  Pt's PMH includes HTN, chronic shoulder, pelvic, and back pain, sciatica, and frequent urination.  Clinical Impression  Pt is s/p R THA resulting in the deficits listed below (see PT Problem List). Jessica Hoover will have 24/7 assist upon d/c and is planning to d/c home possibly tomorrow.  Ambulated in hallway and completed therapeutic exercises in sitting this session. Pt will benefit from skilled PT to increase their independence and safety with mobility to allow discharge to the venue listed below.     Follow Up Recommendations Home health PT;Supervision/Assistance - 24 hour    Equipment Recommendations  3in1 (PT)    Recommendations for Other Services OT consult     Precautions / Restrictions Precautions Precautions: Posterior Hip;Fall Precaution Booklet Issued: Yes (comment) Precaution Comments: Reviewed post hip precautions Required Braces or Orthoses: Knee Immobilizer - Right Knee Immobilizer - Right: Other (comment) (not specified in order; although beneficial for stability) Restrictions Weight Bearing Restrictions: Yes RLE Weight Bearing: Weight bearing as tolerated      Mobility  Bed Mobility Overal bed mobility: Needs Assistance Bed Mobility: Supine to Sit     Supine to sit: Min assist     General bed mobility comments: Min assist managing RLE to sitting EOB.  Pt sitting EOB upon arrival w/ RLE up in bed and L foot off side of bed.  Transfers Overall transfer level: Needs assistance Equipment used: Rolling walker (2 wheeled) Transfers: Sit to/from Stand Sit to Stand: Min guard;From elevated surface         General transfer comment: Cues for proper hand placement and technique.  Bed elevated and cues to not bend forward past 90 deg  Ambulation/Gait Ambulation/Gait assistance: Min  guard Ambulation Distance (Feet): 100 Feet Assistive device: Rolling walker (2 wheeled) Gait Pattern/deviations: Step-to pattern;Step-through pattern;Antalgic;Trunk flexed;Decreased weight shift to right;Decreased stride length;Decreased stance time - right   Gait velocity interpretation: Below normal speed for age/gender General Gait Details: Cues for proper management of RW and to stand upright as pt has tendency to lean forward on RW.  VCs to turn L whenever pt has the option.  Stairs            Wheelchair Mobility    Modified Rankin (Stroke Patients Only)       Balance Overall balance assessment: Needs assistance Sitting-balance support: No upper extremity supported;Feet supported Sitting balance-Leahy Scale: Good     Standing balance support: Bilateral upper extremity supported;During functional activity Standing balance-Leahy Scale: Fair Standing balance comment: Pt able to stand at sink and wash hands w/o either UE supported.                             Pertinent Vitals/Pain Pain Assessment: 0-10 Pain Score: 4  Pain Location: R hip Pain Descriptors / Indicators: Aching;Grimacing;Heaviness Pain Intervention(s): Limited activity within patient's tolerance;Monitored during session;Repositioned    Home Living Family/patient expects to be discharged to:: Private residence Living Arrangements: Alone Available Help at Discharge: Family;Available 24 hours/day Type of Home: House Home Access: Stairs to enter Entrance Stairs-Rails: Right Entrance Stairs-Number of Steps: 3 Home Layout: Two level;Able to live on main level with bedroom/bathroom (has a basement that she doesn't go to often) Home Equipment: Walker - 2 wheels;Cane - single point;Crutches;Walker - 4 wheels (rollator)  Prior Function Level of Independence: Independent with assistive device(s)         Comments: Was using rollator when leaving her home     Hand Dominance         Extremity/Trunk Assessment   Upper Extremity Assessment: Defer to OT evaluation           Lower Extremity Assessment: RLE deficits/detail RLE Deficits / Details: weakness and limited ROM s/p R THA       Communication   Communication: No difficulties  Cognition Arousal/Alertness: Awake/alert Behavior During Therapy: WFL for tasks assessed/performed Overall Cognitive Status: Within Functional Limits for tasks assessed                      General Comments      Exercises Total Joint Exercises Ankle Circles/Pumps: AROM;Both;15 reps;Seated Gluteal Sets: Strengthening;10 reps;Both;Seated Straight Leg Raises: AAROM;Right;Other reps (comment);Seated (2 reps; pt unable to achieve SLR w/o assist, ext lag noted)      Assessment/Plan    PT Assessment Patient needs continued PT services  PT Diagnosis Difficulty walking;Abnormality of gait;Generalized weakness;Acute pain   PT Problem List Decreased strength;Decreased range of motion;Decreased activity tolerance;Decreased balance;Decreased coordination;Decreased mobility;Decreased knowledge of use of DME;Decreased safety awareness;Decreased knowledge of precautions;Impaired sensation;Decreased skin integrity;Pain  PT Treatment Interventions DME instruction;Gait training;Stair training;Functional mobility training;Therapeutic activities;Therapeutic exercise;Neuromuscular re-education;Balance training;Patient/family education;Modalities   PT Goals (Current goals can be found in the Care Plan section) Acute Rehab PT Goals Patient Stated Goal: to get stronger and go home tomorrow PT Goal Formulation: With patient/family Time For Goal Achievement: 11/03/14 Potential to Achieve Goals: Good    Frequency 7X/week   Barriers to discharge Inaccessible home environment 3 steps to enter home    Co-evaluation               End of Session Equipment Utilized During Treatment: Gait belt;Right knee immobilizer Activity Tolerance:  Patient limited by fatigue;Patient limited by pain Patient left: in chair;with call bell/phone within reach;with family/visitor present Nurse Communication: Mobility status;Precautions;Weight bearing status         Time: 1200-1225 PT Time Calculation (min) (ACUTE ONLY): 25 min   Charges:   PT Evaluation $Initial PT Evaluation Tier I: 1 Procedure PT Treatments $Gait Training: 8-22 mins   PT G CodesMichail Jewels PT, DPT [phone] Pager: [phone] 10/27/2014, 1:11 PM

## 2014-10-27 NOTE — Progress Notes (Signed)
Patient ID: Jessica Hoover, female   DOB: [birth year], 70 y.o.   MRN: [id number] PATIENT ID: Jessica Hoover        MRN:  [id number]          DOB/AGE: 12-31-1945 / 69 y.o.    Jessica Campbell, MD   [name], PA-C [address] [place], [place]  [address]                             [phone]   PROGRESS NOTE  Subjective:  negative for Chest Pain  negative for Shortness of Breath  negative for Nausea/Vomiting   negative for Calf Pain    Tolerating Diet: yes         Patient reports pain as mild.     Comfortable night  Objective: Vital signs in last 24 hours:   Patient Vitals for the past 24 hrs:  BP Temp Pulse Resp SpO2  10/27/14 0528 128/64 mmHg 98.7 F (37.1 C) 82 16 97 %  10/27/14 0028 (!) 128/58 mmHg 98.9 F (37.2 C) 86 16 96 %  10/26/14 2041 126/64 mmHg 98.2 F (36.8 C) 74 16 97 %  10/26/14 2026 - - - - 95 %  10/26/14 2022 - - - - 98 %  10/26/14 1115 129/62 mmHg 97.5 F (36.4 C) 78 16 99 %  10/26/14 1045 116/70 mmHg 97.8 F (36.6 C) 81 - 100 %  10/26/14 1030 118/60 mmHg - 82 15 100 %  10/26/14 1015 (!) 141/68 mmHg - 77 14 99 %  10/26/14 1005 (!) 157/65 mmHg - 84 16 100 %  10/26/14 0950 (!) 153/69 mmHg - 82 16 100 %  10/26/14 0935 (!) 143/66 mmHg 98 F (36.7 C) 89 14 100 %      Intake/Output from previous day:   08/02 0701 - 08/03 0700 In: 2170 [P.O.:170; I.V.:1600] Out: 1500 [Urine:1400]   Intake/Output this shift:       Intake/Output      08/02 0701 - 08/03 0700 08/03 0701 - 08/04 0700   P.O. 170    I.V. (mL/kg) 1600 (20.5)    IV Piggyback 400    Total Intake(mL/kg) 2170 (27.8)    Urine (mL/kg/hr) 1400 (0.7)    Blood 100 (0.1)    Total Output 1500     Net +670             LABORATORY DATA:  Recent Labs  10/27/14 0644  WBC 9.0  HGB 10.1*  HCT 30.5*  PLT 292   No results for input(s): NA, K, CL, CO2, BUN, CREATININE, GLUCOSE, CALCIUM in the last 168 hours. Lab Results  Component Value Date   INR 0.97 10/15/2014    Recent Radiographic  Studies :  Dg Chest 2 View  10/15/2014   CLINICAL DATA:  Preoperative for total hip arthroplasty, long history of smoking, history of diabetes and hypertension  EXAM: CHEST  2 VIEW  COMPARISON:  PA and lateral chest of January 25, 2012  FINDINGS: The lungs are well-expanded and clear. The heart and pulmonary vascularity are normal. The mediastinum is normal in width. There is no pleural effusion. There is mild multilevel degenerative disc disease.  IMPRESSION: There is no active cardiopulmonary disease.   Electronically Signed   By: David  [place] M.D.   On: 10/15/2014 08:58   Dg Hip Port Unilat With Pelvis 1v Right  10/26/2014   CLINICAL DATA:  Postoperative state.  EXAM: DG  HIP (WITH OR WITHOUT PELVIS) 1V PORT RIGHT  COMPARISON:  04/28/2013  FINDINGS: Patient has undergone right total hip arthroplasty. Surgical clips overlie the lateral portion of the hip. There is postop gas in the soft tissues. Prosthesis is normally located. No acute fractures. Degenerative changes are seen in the left hip.  IMPRESSION: Status post right hip arthroplasty.  No adverse features.   Electronically Signed   By: Norva Pavlov M.D.   On: 10/26/2014 11:46     Examination:  General appearance: alert, cooperative and no distress  Wound Exam: clean, dry, intact   Drainage:  None: wound tissue dry  Motor Exam: EHL, FHL, Anterior Tibial and Posterior Tibial Intact  Sensory Exam: Superficial Peroneal, Deep Peroneal and Tibial normal  Vascular Exam: Normal  Assessment:    1 Day Post-Op  Procedure(s) (LRB): TOTAL HIP ARTHROPLASTY (Right)  ADDITIONAL DIAGNOSIS:  Active Problems:   Primary osteoarthritis of left hip   S/P total hip arthroplasty  No new problems   Plan: Physical Therapy as ordered Weight Bearing as Tolerated (WBAT)  DVT Prophylaxis:  Xarelto, Foot Pumps and TED hose  DISCHARGE PLAN: Home  DISCHARGE NEEDS: HHPT, Walker and 3-in-1 comode seat   OOB with PT, saline lock IV, monitor lab       Valeria Batman  10/27/2014 7:41 AM

## 2014-10-27 NOTE — Progress Notes (Signed)
Physical Therapy Treatment Patient Details Name: Jessica Hoover MRN: [id number] DOB: Sep 18, 1945 Today's Date: 10/27/2014    History of Present Illness Pt is a 69 y/o F s/p R THA.  Pt's PMH includes HTN, chronic shoulder, pelvic, and back pain, sciatica, and frequent urination.    PT Comments    Patient making gradual progress with PT intervention. Patient denies pain but did report increasing tenderness and soreness by end of afternoon session. Will continue with PT intervention to increase independence with mobility.   Follow Up Recommendations  Home health PT;Supervision/Assistance - 24 hour     Equipment Recommendations       Recommendations for Other Services       Precautions / Restrictions Precautions Precautions: Posterior Hip;Fall Precaution Comments: Reviewed post hip precautions Required Braces or Orthoses: Knee Immobilizer - Right Knee Immobilizer - Right: Other (comment) (not stated in orders) Restrictions Weight Bearing Restrictions: Yes RLE Weight Bearing: Weight bearing as tolerated    Mobility  Bed Mobility Overal bed mobility: Needs Assistance Bed Mobility: Sit to Supine       Sit to supine: Min guard   General bed mobility comments: Min guard for safety. Mod verbal cues to adhere to hip precautions   Transfers Overall transfer level: Needs assistance Equipment used: Rolling walker (2 wheeled) Transfers: Sit to/from Stand Sit to Stand: Min guard         General transfer comment: Min guard for safety.   Ambulation/Gait Ambulation/Gait assistance: Supervision Ambulation Distance (Feet): 130 Feet Assistive device: Rolling walker (2 wheeled) Gait Pattern/deviations: Step-through pattern   Gait velocity interpretation: Below normal speed for age/gender     Stairs            Wheelchair Mobility    Modified Rankin (Stroke Patients Only)       Balance Overall balance assessment: Needs assistance Sitting-balance support: No upper  extremity supported Sitting balance-Leahy Scale: Good     Standing balance support: Bilateral upper extremity supported Standing balance-Leahy Scale: Poor                      Cognition Arousal/Alertness: Awake/alert Behavior During Therapy: WFL for tasks assessed/performed Overall Cognitive Status: Within Functional Limits for tasks assessed       Memory: Decreased recall of precautions (reviewed beginning and during session)              Exercises Total Joint Exercises Ankle Circles/Pumps: AROM;Both;10 reps Quad Sets: Strengthening;Right;10 reps Gluteal Sets: Both;10 reps (patient reports having trouble firing muscles) Heel Slides: AAROM;Right;10 reps;Supine    General Comments        Pertinent Vitals/Pain Pain Assessment: 0-10 Pain Score: 0-No pain Pain Location: R hip Pain Descriptors / Indicators: Sore Pain Intervention(s): Monitored during session;Ice applied    Home Living Family/patient expects to be discharged to:: Private residence   Available Help at Discharge: Family;Available 24 hours/day Type of Home: House Home Access: Stairs to enter Entrance Stairs-Rails: Right Home Layout: Two level;Able to live on main level with bedroom/bathroom Home Equipment: Dan Humphreys - 2 wheels;Cane - single point;Crutches;Walker - 4 wheels      Prior Function Level of Independence: Independent with assistive device(s)      Comments: Was using rollator when leaving her home   PT Goals (current goals can now be found in the care plan section) Acute Rehab PT Goals Patient Stated Goal: to get stronger and go home tomorrow PT Goal Formulation: With patient/family Time For Goal Achievement: 11/03/14 Potential to Achieve  Goals: Good Progress towards PT goals: Progressing toward goals    Frequency  7X/week    PT Plan Current plan remains appropriate    Co-evaluation             End of Session Equipment Utilized During Treatment: Gait belt;Right knee  immobilizer Activity Tolerance: Patient limited by fatigue Patient left: in bed;with call bell/phone within reach;with family/visitor present;with SCD's reapplied     Time: 1610-9604 PT Time Calculation (min) (ACUTE ONLY): 22 min  Charges:  $Gait Training: 8-22 mins $Therapeutic Exercise: 8-22 mins                    G Codes:      Christiane Ha, PT, CSCS Pager [phone] Office [phone]  10/27/2014, 4:24 PM

## 2014-10-28 ENCOUNTER — Inpatient Hospital Stay (HOSPITAL_COMMUNITY): Payer: Medicare HMO

## 2014-10-28 DIAGNOSIS — M1611 Unilateral primary osteoarthritis, right hip: Secondary | ICD-10-CM | POA: Diagnosis present

## 2014-10-28 DIAGNOSIS — E876 Hypokalemia: Secondary | ICD-10-CM | POA: Diagnosis not present

## 2014-10-28 LAB — BASIC METABOLIC PANEL
Anion gap: 7 (ref 5–15)
Anion gap: 9 (ref 5–15)
BUN: 11 mg/dL (ref 6–20)
BUN: 10 mg/dL (ref 6–20)
CO2: 28 mmol/L (ref 22–32)
CO2: 26 mmol/L (ref 22–32)
CREATININE: 1.06 mg/dL — AB (ref 0.44–1.00)
Calcium: 8.8 mg/dL — ABNORMAL LOW (ref 8.9–10.3)
Calcium: 8.9 mg/dL (ref 8.9–10.3)
Chloride: 104 mmol/L (ref 101–111)
Chloride: 102 mmol/L (ref 101–111)
Creatinine, Ser: 0.97 mg/dL (ref 0.44–1.00)
GFR calc Af Amer: >60 mL/min (ref >60)
GFR calc non Af Amer: 58 mL/min — ABNORMAL LOW (ref >60)
GFR calc non Af Amer: 52 mL/min — ABNORMAL LOW (ref >60)
GLUCOSE: 180 mg/dL — AB (ref 65–99)
Glucose, Bld: 110 mg/dL — ABNORMAL HIGH (ref 65–99)
Potassium: 2.9 mmol/L — ABNORMAL LOW (ref 3.5–5.1)
Potassium: 2.9 mmol/L — ABNORMAL LOW (ref 3.5–5.1)
Sodium: 139 mmol/L (ref 135–145)
Sodium: 137 mmol/L (ref 135–145)

## 2014-10-28 LAB — CBC
HCT: 29.5 % — ABNORMAL LOW (ref 36.0–46.0)
Hemoglobin: 9.4 g/dL — ABNORMAL LOW (ref 12.0–15.0)
MCH: 26.9 pg (ref 26.0–34.0)
MCHC: 31.9 g/dL (ref 30.0–36.0)
MCV: 84.5 fL (ref 78.0–100.0)
Platelets: 296 10*3/uL (ref 150–400)
RBC: 3.49 MIL/uL — ABNORMAL LOW (ref 3.87–5.11)
RDW: 13.2 % (ref 11.5–15.5)
WBC: 8 10*3/uL (ref 4.0–10.5)

## 2014-10-28 MED ORDER — RIVAROXABAN 10 MG PO TABS: 10.0000 mg | ORAL_TABLET | Freq: Every day | ORAL | Status: DC

## 2014-10-28 MED ORDER — HYDROMORPHONE HCL 2 MG PO TABS: 2.0000 mg | ORAL_TABLET | ORAL | Status: DC | PRN

## 2014-10-28 MED ORDER — METHOCARBAMOL 500 MG PO TABS: 500.0000 mg | ORAL_TABLET | Freq: Three times a day (TID) | ORAL | Status: DC | PRN

## 2014-10-28 MED ORDER — POTASSIUM CHLORIDE CRYS ER 20 MEQ PO TBCR
40.0000 meq | EXTENDED_RELEASE_TABLET | Freq: Once | ORAL | Status: AC
  Administered 2014-10-28: 40 meq via ORAL
  Filled 2014-10-28: qty 2

## 2014-10-28 NOTE — Clinical Documentation Improvement (Signed)
Please choose the clarification below as appropriate:  1.  The attending physician is required to clarify conflicting documentation in the medical record.  The following documentation is noted in the MEDICAL RECORD NUMBER Diagnosis 1:  TOTAL HIP ARTHROPLASTY (Right) Documented by: [name] Location:Progress Note 10/28/14 11:57 AM  Diagnosis 2:  Active Problems: Primary osteoarthritis of left hip Documented by: [name] Location:Progress Note 10/28/14 11:57 AM         Harless Litten ,RN Clinical Documentation Specialist:  [phone]  [facility]- Health Information Management

## 2014-10-28 NOTE — Progress Notes (Signed)
Physical Therapy Treatment Patient Details Name: Jessica Hoover MRN: [id number] DOB: 04/26/1945 Today's Date: 10/28/2014    History of Present Illness Pt is a 69 y/o F s/p R THA.  Pt's PMH includes HTN, chronic shoulder, pelvic, and back pain, sciatica, and frequent urination.    PT Comments    Pt is making good progress w/ PT.  Pt demonstrated ability to ambulate 200 ft and ascend/descend 2 steps this session. Mrs. Mesta is safe to return home from a mobility standpoint w/ supervision/assist 24/7. Pt will benefit from continued skilled PT services to increase functional independence and safety.   Follow Up Recommendations  Home health PT;Supervision/Assistance - 24 hour     Equipment Recommendations  3in1 (PT)    Recommendations for Other Services       Precautions / Restrictions Precautions Precautions: Posterior Hip;Fall Precaution Comments: Reviewed post hip precautions; Pt recalled 3/3 precautions at beginning and end of session Required Braces or Orthoses: Knee Immobilizer - Right Knee Immobilizer - Right: Other (comment) (not stated in orders) Restrictions Weight Bearing Restrictions: Yes RLE Weight Bearing: Weight bearing as tolerated    Mobility  Bed Mobility Overal bed mobility: Needs Assistance Bed Mobility: Supine to Sit     Supine to sit: Supervision;HOB elevated     General bed mobility comments: Pt in recliner in gym after finishing w/ OT at start of PT session  Transfers Overall transfer level: Needs assistance Equipment used: Rolling walker (2 wheeled) Transfers: Sit to/from Stand Sit to Stand: Supervision         General transfer comment: Supervision for safety.  Demonstration and verbal cues to slide RLE out in front of her when sitting or standing to adhere to hip precautions.  Ambulation/Gait Ambulation/Gait assistance: Supervision Ambulation Distance (Feet): 200 Feet Assistive device: Rolling walker (2 wheeled) Gait Pattern/deviations:  Step-through pattern;Antalgic;Trunk flexed;Decreased stance time - right   Gait velocity interpretation: Below normal speed for age/gender General Gait Details: Cues to stand up straight and look ahead.  Pt relying on RW for support as reports her R hip is sore.   Stairs Stairs: Yes Stairs assistance: Min guard Stair Management: One rail Right;Step to pattern;Sideways Number of Stairs: 2 General stair comments: Demonstration and VCs for proper technique.  Wheelchair Mobility    Modified Rankin (Stroke Patients Only)       Balance Overall balance assessment: Needs assistance Sitting-balance support: No upper extremity supported;Feet supported Sitting balance-Leahy Scale: Good     Standing balance support: Bilateral upper extremity supported;During functional activity Standing balance-Leahy Scale: Fair                      Cognition Arousal/Alertness: Awake/alert Behavior During Therapy: WFL for tasks assessed/performed Overall Cognitive Status: Within Functional Limits for tasks assessed       Memory: Decreased recall of precautions (reviewed at beginning, during, and end of session)              Exercises Total Joint Exercises Ankle Circles/Pumps: AROM;Both;10 reps;Seated Gluteal Sets:  (patient reports having trouble firing muscles) Knee Flexion: Strengthening;Right;10 reps;Standing Standing Hip Extension: Strengthening;Right;10 reps;Standing    General Comments        Pertinent Vitals/Pain Pain Assessment: 0-10 Pain Score: 5  Pain Location: R hip Pain Descriptors / Indicators: Aching;Tightness;Sore Pain Intervention(s): Limited activity within patient's tolerance;Monitored during session;Repositioned    Home Living  Prior Function            PT Goals (current goals can now be found in the care plan section) Acute Rehab PT Goals Patient Stated Goal: to go home today PT Goal Formulation: With  patient/family Time For Goal Achievement: 11/03/14 Potential to Achieve Goals: Good Progress towards PT goals: Progressing toward goals    Frequency  7X/week    PT Plan Current plan remains appropriate    Co-evaluation             End of Session Equipment Utilized During Treatment: Gait belt Activity Tolerance: Patient limited by fatigue Patient left: with call bell/phone within reach;in chair     Time: 1610-9604 PT Time Calculation (min) (ACUTE ONLY): 39 min  Charges:  $Gait Training: 23-37 mins $Therapeutic Exercise: 8-22 mins                    G Codes:      Michail Jewels PT, [place] [phone] Pager: [phone] 10/28/2014, 12:04 PM

## 2014-10-28 NOTE — Discharge Summary (Signed)
Jessica Campbell, MD   [name], PA-C [address], [place], [place]  [address]                             [phone]  PATIENT ID: Jessica Hoover        MRN:  [id number]          DOB/AGE: 07-16-1945 / 69 y.o.    DISCHARGE SUMMARY  ADMISSION DATE:    10/26/2014 DISCHARGE DATE:   10/28/2014   ADMISSION DIAGNOSIS: RIGHT HIP PRIMARY OSTEOARTHRITIS    DISCHARGE DIAGNOSIS:  RIGHT HIP PRIMARY OSTEOARTHRITIS    ADDITIONAL DIAGNOSIS: Principal Problem:   Primary osteoarthritis of right hip Active Problems:   Primary osteoarthritis of left hip   S/P total hip arthroplasty   Hypokalemia  Past Medical History  Diagnosis Date  . Hypertension   . Chronic shoulder pain   . Chronic pelvic pain in female     pelvic joint and thigh  . Chronic back pain   . Chronic right hip pain   . Sciatica   . PONV (postoperative nausea and vomiting)   . Pneumonia     hx of  . 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 Frequency of urination   . Arthritis     PROCEDURE: Procedure(s): TOTAL HIP ARTHROPLASTY Right on 10/26/2014  CONSULTS: none     HISTORY: Perline is a very pleasant 69 year old African American female who is seen today for evaluation of her right hip. She has been seen previously dating back to 2016 by another orthopedist who had noted that she was having symptoms in her right hip and groin area. At that time, a corticosteroid injection was initiated with some benefit. There was some question as to whether there was also pathology in the lumbar spine. She was significantly uncomfortable and now she is worsening. She has difficulty with sleeping as well as in performing her activities of daily living. She does have bilateral groin pain and some anterior thigh pain. Again, worsens with activity. Improves with rest, but still is unable to sleep well. She denies any history of injury or trauma  HOSPITAL COURSE:  Jessica Hoover is a 69 y.o. admitted on 10/26/2014 and found to have a  diagnosis of RIGHT HIP OSTEOARTHRITIS.  After appropriate laboratory studies were obtained  they were taken to the operating room on 10/26/2014 and underwent  Procedure(s): TOTAL HIP ARTHROPLASTY  Right.   They were given perioperative antibiotics:  Anti-infectives    Start     Dose/Rate Route Frequency Ordered Stop   10/26/14 2000  vancomycin (VANCOCIN) IVPB 1000 mg/200 mL premix     1,000 mg 200 mL/hr over 60 Minutes Intravenous  Once 10/26/14 1001 10/26/14 2119   10/26/14 0700  vancomycin (VANCOCIN) IVPB 1000 mg/200 mL premix     1,000 mg 200 mL/hr over 60 Minutes Intravenous To ShortStay Surgical 10/25/14 1211 10/26/14 0815    .  Tolerated the procedure well.  Placed with a foley intraoperatively.    Toradol was given post op.  POD #1, allowed out of bed to a chair.  PT for ambulation and exercise program.  Foley D/C'd in morning.  IV saline locked.  O2 discontionued.  POD #2, continued PT and ambulation.  Had some congestion she states she gets from sinus drainage into her chect.  Obtained CXR without problems noted.  K was 2.9 and repeated at 2.9.  Given KCL.  Will eat bananas for K.  Will repeat on MOnday .  The remainder of the hospital course was dedicated to ambulation and strengthening.   The patient was discharged on 2 Days Post-Op in  Stable condition.  Blood products given:none  DIAGNOSTIC STUDIES: Recent vital signs:  Patient Vitals for the past 24 hrs:  BP Temp Temp src Pulse Resp SpO2  10/28/14 1450 136/62 mmHg 100 F (37.8 C) Oral 95 16 97 %  10/28/14 0549 118/84 mmHg 98.6 F (37 C) - 99 18 95 %  10/27/14 2114 130/64 mmHg 98.3 F (36.8 C) - 80 18 98 %       Recent laboratory studies:  Recent Labs  10/27/14 0644 10/28/14 0419  WBC 9.0 8.0  HGB 10.1* 9.4*  HCT 30.5* 29.5*  PLT 292 296    Recent Labs  10/27/14 0644 10/28/14 0419 10/28/14 1404  NA 138 139 137  K 3.5 2.9* 2.9*  CL 105 104 102  CO2 24 28 26   BUN 11 11 10   CREATININE 1.01*  0.97 1.06*  GLUCOSE 122* 110* 180*  CALCIUM 9.1 8.8* 8.9   Lab Results  Component Value Date   INR 0.97 10/15/2014     Recent Radiographic Studies :  Dg Chest 2 View  10/28/2014   CLINICAL DATA:  Patient with cough, status post recent hip replacement surgery.  EXAM: CHEST  2 VIEW  COMPARISON:  10/15/2014  FINDINGS: Cardiomediastinal silhouette is normal. Mediastinal contours appear intact. Calcifications of the aortic knob are noted.  There is no evidence of focal airspace consolidation, pleural effusion or pneumothorax.  Osseous structures are without acute abnormality. Soft tissues are grossly normal.  IMPRESSION: No radiographic evidence of acute cardiopulmonary abnormality.   Electronically Signed   By: Ted Mcalpine M.D.   On: 10/28/2014 13:07   Dg Chest 2 View  10/15/2014   CLINICAL DATA:  Preoperative for total hip arthroplasty, long history of smoking, history of diabetes and hypertension  EXAM: CHEST  2 VIEW  COMPARISON:  PA and lateral chest of January 25, 2012  FINDINGS: The lungs are well-expanded and clear. The heart and pulmonary vascularity are normal. The mediastinum is normal in width. There is no pleural effusion. There is mild multilevel degenerative disc disease.  IMPRESSION: There is no active cardiopulmonary disease.   Electronically Signed   By: David  [place] M.D.   On: 10/15/2014 08:58   Dg Hip Port Unilat With Pelvis 1v Right  10/26/2014   CLINICAL DATA:  Postoperative state.  EXAM: DG HIP (WITH OR WITHOUT PELVIS) 1V PORT RIGHT  COMPARISON:  04/28/2013  FINDINGS: Patient has undergone right total hip arthroplasty. Surgical clips overlie the lateral portion of the hip. There is postop gas in the soft tissues. Prosthesis is normally located. No acute fractures. Degenerative changes are seen in the left hip.  IMPRESSION: Status post right hip arthroplasty.  No adverse features.   Electronically Signed   By: Norva Pavlov M.D.   On: 10/26/2014 11:46    DISCHARGE  INSTRUCTIONS:     Discharge Instructions    Call MD / Call 911    Complete by:  As directed   If you experience chest pain or shortness of breath, CALL 911 and be transported to the hospital emergency room.  If you develope a fever above 101 F, pus (white drainage) or increased drainage or redness at the wound, or calf pain, call your surgeon's office.     Change dressing  Complete by:  As directed   DO NOT CHANGE DRESSING. KEEP ON TILL SEEN IN THE OFFICE     Constipation Prevention    Complete by:  As directed   Drink plenty of fluids.  Prune juice may be helpful.  You may use a stool softener, such as Colace (over the counter) 100 mg twice a day.  Use MiraLax (over the counter) for constipation as needed.     Diet general    Complete by:  As directed      Discharge instructions    Complete by:  As directed   INSTRUCTIONS AFTER JOINT REPLACEMENT   Remove items at home which could result in a fall. This includes throw rugs or furniture in walking pathways ICE to the affected joint every three hours while awake for 30 minutes at a time, for at least the first 3-5 days, and then as needed for pain and swelling.  Continue to use ice for pain and swelling. You may notice swelling that will progress down to the foot and ankle.  This is normal after surgery.  Elevate your leg when you are not up walking on it.   Continue to use the breathing machine you got in the hospital (incentive spirometer) which will help keep your temperature down.  It is common for your temperature to cycle up and down following surgery, especially at night when you are not up moving around and exerting yourself.  The breathing machine keeps your lungs expanded and your temperature down.   DIET:  As you were doing prior to hospitalization, we recommend a well-balanced diet.  DRESSING / WOUND CARE / SHOWERING  Keep the surgical dressing until follow up.  The dressing is water proof, so you can shower without any extra  covering.  IF THE DRESSING FALLS OFF or the wound gets wet inside, change the dressing with sterile gauze.  Please use good hand washing techniques before changing the dressing.  Do not use any lotions or creams on the incision until instructed by your surgeon.    ACTIVITY  Increase activity slowly as tolerated, but follow the weight bearing instructions below.   No driving for 6 weeks or until further direction given by your physician.  You cannot drive while taking narcotics.  No lifting or carrying greater than 10 lbs. until further directed by your surgeon. Avoid periods of inactivity such as sitting longer than an hour when not asleep. This helps prevent blood clots.  You may return to work once you are authorized by your doctor.     WEIGHT BEARING   Weight bearing as tolerated with assist device (walker, cane, etc) as directed, use it as long as suggested by your surgeon or therapist, typically at least 4-6 weeks.   EXERCISES  Results after joint replacement surgery are often greatly improved when you follow the exercise, range of motion and muscle strengthening exercises prescribed by your doctor. Safety measures are also important to protect the joint from further injury. Any time any of these exercises cause you to have increased pain or swelling, decrease what you are doing until you are comfortable again and then slowly increase them. If you have problems or questions, call your caregiver or physical therapist for advice.   Rehabilitation is important following a joint replacement. After just a few days of immobilization, the muscles of the leg can become weakened and shrink (atrophy).  These exercises are designed to build up the tone and strength of the thigh and  leg muscles and to improve motion. Often times heat used for twenty to thirty minutes before working out will loosen up your tissues and help with improving the range of motion but do not use heat for the first two weeks  following surgery (sometimes heat can increase post-operative swelling).   These exercises can be done on a training (exercise) mat, on the floor, on a table or on a bed. Use whatever works the best and is most comfortable for you.    Use music or television while you are exercising so that the exercises are a pleasant break in your day. This will make your life better with the exercises acting as a break in your routine that you can look forward to.   Perform all exercises about fifteen times, three times per day or as directed.  You should exercise both the operative leg and the other leg as well.   Exercises include:   Quad Sets - Tighten up the muscle on the front of the thigh (Quad) and hold for 5-10 seconds.   Straight Leg Raises - With your knee straight (if you were given a brace, keep it on), lift the leg to 60 degrees, hold for 3 seconds, and slowly lower the leg.  Perform this exercise against resistance later as your leg gets stronger.  Leg Slides: Lying on your back, slowly slide your foot toward your buttocks, bending your knee up off the floor (only go as far as is comfortable). Then slowly slide your foot back down until your leg is flat on the floor again.  Angel Wings: Lying on your back spread your legs to the side as far apart as you can without causing discomfort.  Hamstring Strength:  Lying on your back, push your heel against the floor with your leg straight by tightening up the muscles of your buttocks.  Repeat, but this time bend your knee to a comfortable angle, and push your heel against the floor.  You may put a pillow under the heel to make it more comfortable if necessary.   A rehabilitation program following joint replacement surgery can speed recovery and prevent re-injury in the future due to weakened muscles. Contact your doctor or a physical therapist for more information on knee rehabilitation.    CONSTIPATION  Constipation is defined medically as fewer than three  stools per week and severe constipation as less than one stool per week.  Even if you have a regular bowel pattern at home, your normal regimen is likely to be disrupted due to multiple reasons following surgery.  Combination of anesthesia, postoperative narcotics, change in appetite and fluid intake all can affect your bowels.   YOU MUST use at least one of the following options; they are listed in order of increasing strength to get the job done.  They are all available over the counter, and you may need to use some, POSSIBLY even all of these options:    Drink plenty of fluids (prune juice may be helpful) and high fiber foods Colace 100 mg by mouth twice a day  Senokot for constipation as directed and as needed Dulcolax (bisacodyl), take with full glass of water  Miralax (polyethylene glycol) once or twice a day as needed.  If you have tried all these things and are unable to have a bowel movement in the first 3-4 days after surgery call either your surgeon or your primary doctor.    If you experience loose stools or diarrhea, hold the medications  until you stool forms back up.  If your symptoms do not get better within 1 week or if they get worse, check with your doctor.  If you experience "the worst abdominal pain ever" or develop nausea or vomiting, please contact the office immediately for further recommendations for treatment.   ITCHING:  If you experience itching with your medications, try taking only a single pain pill, or even half a pain pill at a time.  You can also use Benadryl over the counter for itching or also to help with sleep.   TED HOSE STOCKINGS:  Use stockings on both legs until for at least 2 weeks or as directed by physician office. They may be removed at night for sleeping.  MEDICATIONS:  See your medication summary on the "After Visit Summary" that nursing will review with you.  You may have some home medications which will be placed on hold until you complete the course  of blood thinner medication.  It is important for you to complete the blood thinner medication as prescribed.  PRECAUTIONS:  If you experience chest pain or shortness of breath - call 911 immediately for transfer to the hospital emergency department.   If you develop a fever greater that 101 F, purulent drainage from wound, increased redness or drainage from wound, foul odor from the wound/dressing, or calf pain - CONTACT YOUR SURGEON.                                                   FOLLOW-UP APPOINTMENTS:  If you do not already have a post-op appointment, please call the office for an appointment to be seen by your surgeon.  Guidelines for how soon to be seen are listed in your "After Visit Summary", but are typically between 1-4 weeks after surgery.  OTHER INSTRUCTIONS:   Knee Replacement:  Do not place pillow under knee, focus on keeping the knee straight while resting. CPM instructions: 0-90 degrees, 2 hours in the morning, 2 hours in the afternoon, and 2 hours in the evening. Place foam block, curve side up under heel at all times except when in CPM or when walking.  DO NOT modify, tear, cut, or change the foam block in any way.  MAKE SURE YOU:  Understand these instructions.  Get help right away if you are not doing well or get worse.    Thank you for letting us be a part of your medical care team.  It is a privilege we respect greatly.  We hope these instructions will help you stay on track for a fast and full recovery!     Driving restrictions    Complete by:  As directed   No driving for 6 weeks     Follow the hip precautions as taught in Physical Therapy    Complete by:  As directed      Increase activity slowly as tolerated    Complete by:  As directed      Lifting restrictions    Complete by:  As directed   No lifting for 6 weeks     Patient may shower    Complete by:  As directed   YOU MAY SHOWER OVER THE DRESSING.  DO NOT REMOVE DRESSING     TED hose    Complete by:   As directed   Use stockings (  TED hose) for 2 weeks on RIGHT leg(s).  You may remove them at night for sleeping.     Weight bearing as tolerated    Complete by:  As directed   Laterality:  right  Extremity:  Lower           DISCHARGE MEDICATIONS:     Medication List    STOP taking these medications        meloxicam 7.5 MG tablet  Commonly known as:  MOBIC      TAKE these medications        amLODipine 10 MG tablet  Commonly known as:  NORVASC  Take 10 mg by mouth daily.     HYDROmorphone 2 MG tablet  Commonly known as:  DILAUDID  Take 1 tablet (2 mg total) by mouth every 4 (four) hours as needed for severe pain (1 - 2 TABLETS Q 4H PRN PAIN).     methocarbamol 500 MG tablet  Commonly known as:  ROBAXIN  Take 1 tablet (500 mg total) by mouth every 8 (eight) hours as needed for muscle spasms.     ondansetron 8 MG disintegrating tablet  Commonly known as:  ZOFRAN ODT   ODT q4 hours prn nausea     rivaroxaban 10 MG Tabs tablet  Commonly known as:  XARELTO  Take 1 tablet (10 mg total) by mouth daily with breakfast.        FOLLOW UP VISIT:   Follow-up Information    Follow up with [facility], Claude Manges, MD. Schedule an appointment as soon as possible for a visit on 11/10/2014.   Specialty:  Orthopedic Surgery   Why:  They will contact you to schedule home therapy visits.   Contact information:   [address] [place] [address] [phone]       DISPOSITION:   Home  CONDITION:  Stable   Oris Drone. Aleda Grana [facility] [phone]  10/28/2014 3:37 PM

## 2014-10-28 NOTE — Care Management Important Message (Signed)
Important Message  Patient Details  Name: Jessica Hoover MRN: [id number] Date of Birth: 08-Sep-1945   Medicare Important Message Given:  Yes-second notification given    Orson Aloe 10/28/2014, 2:25 PM

## 2014-10-28 NOTE — Progress Notes (Signed)
Occupational Therapy Treatment Patient Details Name: NDIDI NESBY MRN: [id number] DOB: 01-09-1946 Today's Date: 10/28/2014    History of present illness Pt is a 69 y/o F s/p R THA.  Pt's PMH includes HTN, chronic shoulder, pelvic, and back pain, sciatica, and frequent urination.   OT comments  Patient progressing nicely towards OT goals, continue plan of care for now. Patient with poor carryover and memory regarding posterior hip precautions, requiring min assist to adhere to them and max assist to verbalize them. Pt states that her family will be available to assist prn post acute d/c. Encouraged patient to purchase hip kit to increase independence with LB ADLs.    Follow Up Recommendations  No OT follow up;Supervision - Intermittent;Other (comment) (AE)    Equipment Recommendations  3 in 1 bedside comode    Recommendations for Other Services  None at this time   Precautions / Restrictions Precautions Precautions: Posterior Hip;Fall Precaution Comments: Reviewed post hip precautions Required Braces or Orthoses: Knee Immobilizer - Right Knee Immobilizer - Right: Other (comment) (not stated in orders) Restrictions Weight Bearing Restrictions: Yes RLE Weight Bearing: Weight bearing as tolerated    Mobility Bed Mobility Overal bed mobility: Needs Assistance Bed Mobility: Supine to Sit     Supine to sit: Supervision;HOB elevated     General bed mobility comments: Min use of bed rails. Supervision for safety. Min cues to adhere to hip precautions   Transfers Overall transfer level: Needs assistance Equipment used: Rolling walker (2 wheeled) Transfers: Sit to/from Stand Sit to Stand: Supervision General transfer comment: Close supervision for safety. Min cues to adhere to hip precautions.     Balance Overall balance assessment: Needs assistance Sitting-balance support: No upper extremity supported;Feet supported Sitting balance-Leahy Scale: Good     Standing balance  support: Bilateral upper extremity supported;During functional activity Standing balance-Leahy Scale: Fair   ADL Overall ADL's : Needs assistance/impaired General ADL Comments: Pt found supine in bed. Pt engaged in bed mobility with supervision, HOB elevated and min use of bed rails. Pt transferred > recliner. Therapist assisted patient room > therapy gym. Once in gym, therapist educated, demonstrated, and had patient return demonstrate use of AE (reacher, sock aid, LH sponge, LH shoe horn) and tub/shower transfer using BSC. Pt with poor memory regarding posterior hip precautions and required min verbal cues for adherence and max cues to verbalize them.      Cognition   Behavior During Therapy: WFL for tasks assessed/performed Overall Cognitive Status: Within Functional Limits for tasks assessed       Memory: Decreased recall of precautions (reviewed at beginning, during, and end of session)    Pertinent Vitals/ Pain       Pain Assessment: 0-10 Pain Score: 6  Pain Location: R hip Pain Descriptors / Indicators: Sore Pain Intervention(s): Monitored during session   Frequency Min 2X/week     Progress Toward Goals  OT Goals(current goals can now befound in the care plan section)  Progress towards OT goals: Progressing toward goals     Plan Discharge plan remains appropriate    End of Session Equipment Utilized During Treatment: Rolling walker;Right knee immobilizer   Activity Tolerance Patient tolerated treatment well   Patient Left in chair (waiting in therapy gym for PT)    Time: 4944-9675 OT Time Calculation (min): 24 min  Charges: OT General Charges $OT Visit: 1 Procedure OT Treatments $Self Care/Home Management : 23-37 mins  Takeia Ciaravino , MS, OTR/L, CLT Pager: [phone]  10/28/2014, 10:31 AM

## 2014-10-28 NOTE — Progress Notes (Signed)
Patient ID: Jessica Hoover, female   DOB: 07/25/1945, 69 y.o.   MRN: [id number] PATIENT ID: Jessica Hoover        MRN:  [id number]          DOB/AGE: 1945/11/22 / 69 y.o.    Jessica Campbell, MD   [name], PA-C [address] [place], [place]  [address]                             [phone]   PROGRESS NOTE  Subjective:  negative for Chest Pain  negative for Shortness of Breath  negative for Nausea/Vomiting   negative for Calf Pain    Tolerating Diet: yes         Patient reports pain as mild and moderate.      Objective: Vital signs in last 24 hours:   Patient Vitals for the past 24 hrs:  BP Temp Temp src Pulse Resp SpO2  10/28/14 0549 118/84 mmHg 98.6 F (37 C) - 99 18 95 %  10/27/14 2114 130/64 mmHg 98.3 F (36.8 C) - 80 18 98 %  10/27/14 1400 136/62 mmHg 98.6 F (37 C) Oral 77 18 97 %      Intake/Output from previous day:   08/03 0701 - 08/04 0700 In: 1385 [P.O.:360; I.V.:1025] Out: -    Intake/Output this shift:   08/04 0701 - 08/04 1900 In: 240 [P.O.:240] Out: -    Intake/Output      08/03 0701 - 08/04 0700 08/04 0701 - 08/05 0700   P.O. 360 240   I.V. (mL/kg) 1025 (13.1)    IV Piggyback     Total Intake(mL/kg) 1385 (17.8) 240 (3.1)   Urine (mL/kg/hr)     Blood     Total Output       Net +1385 +240        Urine Occurrence 5 x       LABORATORY DATA:  Recent Labs  10/27/14 0644 10/28/14 0419  WBC 9.0 8.0  HGB 10.1* 9.4*  HCT 30.5* 29.5*  PLT 292 296    Recent Labs  10/27/14 0644 10/28/14 0419  NA 138 139  K 3.5 2.9*  CL 105 104  CO2 24 28  BUN 11 11  CREATININE 1.01* 0.97  GLUCOSE 122* 110*  CALCIUM 9.1 8.8*   Lab Results  Component Value Date   INR 0.97 10/15/2014    Estimated Creatinine Clearance: 60.1 mL/min (by C-G formula based on Cr of 0.97).  Recent Radiographic Studies :  Dg Chest 2 View  10/15/2014   CLINICAL DATA:  Preoperative for total hip arthroplasty, long history of smoking, history of diabetes and  hypertension  EXAM: CHEST  2 VIEW  COMPARISON:  PA and lateral chest of January 25, 2012  FINDINGS: The lungs are well-expanded and clear. The heart and pulmonary vascularity are normal. The mediastinum is normal in width. There is no pleural effusion. There is mild multilevel degenerative disc disease.  IMPRESSION: There is no active cardiopulmonary disease.   Electronically Signed   By: David  [place] M.D.   On: 10/15/2014 08:58   Dg Hip Port Unilat With Pelvis 1v Right  10/26/2014   CLINICAL DATA:  Postoperative state.  EXAM: DG HIP (WITH OR WITHOUT PELVIS) 1V PORT RIGHT  COMPARISON:  04/28/2013  FINDINGS: Patient has undergone right total hip arthroplasty. Surgical clips overlie the lateral portion of the hip. There is postop gas in the soft  tissues. Prosthesis is normally located. No acute fractures. Degenerative changes are seen in the left hip.  IMPRESSION: Status post right hip arthroplasty.  No adverse features.   Electronically Signed   By: Norva Pavlov M.D.   On: 10/26/2014 11:46     Examination:  General appearance: alert, cooperative, mild distress and moderate distress Resp: wheezes bibasilar Cardio: regular rate and rhythm GI: normal findings: bowel sounds normal  Wound Exam: clean, dry, intact   Drainage:  None: wound tissue dry  Motor Exam: EHL, FHL, Anterior Tibial and Posterior Tibial Intact  Sensory Exam: Superficial Peroneal, Deep Peroneal and Tibial normal  Vascular Exam: Right dorsalis pedis artery has 1+ (weak) pulse  Assessment:    2 Days Post-Op  Procedure(s) (LRB): TOTAL HIP ARTHROPLASTY (Right)  ADDITIONAL DIAGNOSIS:  Active Problems:   Primary osteoarthritis of left hip   S/P total hip arthroplasty  Acute Blood Loss Anemia asymptomatic   Plan: Physical Therapy as ordered Weight Bearing as Tolerated (WBAT)  DVT Prophylaxis:  Xarelto, Foot Pumps and TED hose  DISCHARGE PLAN: Home  DISCHARGE NEEDS: HHPT, Walker and 3-in-1 comode seat   CXR to  evaluate for atelectasis Repeat BMET to evaluate K=2.9  Plan D/C to home this afternoon        [facility]  10/28/2014 11:57 AM

## 2014-10-28 NOTE — Progress Notes (Signed)
Contacted Miranda at [facility] and requested BMET be drawn on Monday 11/01/14 with results called to Dr. Cleophas Dunker.

## 2014-10-31 ENCOUNTER — Emergency Department (HOSPITAL_COMMUNITY)
Admission: EM | Admit: 2014-10-31 | Discharge: 2014-11-01 | Disposition: A | Discharge status: Home / Self Care | Payer: Medicare HMO | Attending: Emergency Medicine | Admitting: Emergency Medicine

## 2014-10-31 ENCOUNTER — Emergency Department (HOSPITAL_COMMUNITY): Payer: Medicare HMO

## 2014-10-31 ENCOUNTER — Encounter (HOSPITAL_COMMUNITY): Payer: Self-pay | Admitting: *Deleted

## 2014-10-31 DIAGNOSIS — I10 Essential (primary) hypertension: Secondary | ICD-10-CM | POA: Diagnosis not present

## 2014-10-31 DIAGNOSIS — Z96641 Presence of right artificial hip joint: Secondary | ICD-10-CM | POA: Insufficient documentation

## 2014-10-31 DIAGNOSIS — R42 Dizziness and giddiness: Secondary | ICD-10-CM | POA: Insufficient documentation

## 2014-10-31 DIAGNOSIS — Z720 Tobacco use: Secondary | ICD-10-CM | POA: Insufficient documentation

## 2014-10-31 DIAGNOSIS — R55 Syncope and collapse: Secondary | ICD-10-CM | POA: Diagnosis not present

## 2014-10-31 DIAGNOSIS — Z79899 Other long term (current) drug therapy: Secondary | ICD-10-CM | POA: Diagnosis not present

## 2014-10-31 DIAGNOSIS — Z8701 Personal history of pneumonia (recurrent): Secondary | ICD-10-CM | POA: Diagnosis not present

## 2014-10-31 DIAGNOSIS — Z880 Allergy status to penicillin: Secondary | ICD-10-CM | POA: Diagnosis not present

## 2014-10-31 DIAGNOSIS — G8918 Other acute postprocedural pain: Secondary | ICD-10-CM

## 2014-10-31 DIAGNOSIS — Z791 Long term (current) use of non-steroidal anti-inflammatories (NSAID): Secondary | ICD-10-CM | POA: Diagnosis not present

## 2014-10-31 DIAGNOSIS — Z7901 Long term (current) use of anticoagulants: Secondary | ICD-10-CM | POA: Diagnosis not present

## 2014-10-31 DIAGNOSIS — M1990 Unspecified osteoarthritis, unspecified site: Secondary | ICD-10-CM | POA: Insufficient documentation

## 2014-10-31 DIAGNOSIS — M25561 Pain in right knee: Secondary | ICD-10-CM | POA: Diagnosis not present

## 2014-10-31 DIAGNOSIS — G8929 Other chronic pain: Secondary | ICD-10-CM | POA: Insufficient documentation

## 2014-10-31 LAB — URINALYSIS, ROUTINE W REFLEX MICROSCOPIC
Bilirubin Urine: NEGATIVE
Glucose, UA: NEGATIVE mg/dL
HGB URINE DIPSTICK: NEGATIVE
Ketones, ur: NEGATIVE mg/dL
LEUKOCYTES UA: NEGATIVE
NITRITE: NEGATIVE
Protein, ur: NEGATIVE mg/dL
Specific Gravity, Urine: <1.005 — ABNORMAL LOW (ref 1.005–1.030)
Urobilinogen, UA: 1 mg/dL (ref 0.0–1.0)
pH: 6.5 (ref 5.0–8.0)

## 2014-10-31 LAB — CBC WITH DIFFERENTIAL/PLATELET
BASOS ABS: 0 10*3/uL (ref 0.0–0.1)
BASOS PCT: 0 % (ref 0–1)
Eosinophils Absolute: 0.3 10*3/uL (ref 0.0–0.7)
Eosinophils Relative: 4 % (ref 0–5)
HCT: 30.6 % — ABNORMAL LOW (ref 36.0–46.0)
Hemoglobin: 9.9 g/dL — ABNORMAL LOW (ref 12.0–15.0)
Lymphocytes Relative: 30 % (ref 12–46)
Lymphs Abs: 2.5 10*3/uL (ref 0.7–4.0)
MCH: 27.7 pg (ref 26.0–34.0)
MCHC: 32.4 g/dL (ref 30.0–36.0)
MCV: 85.7 fL (ref 78.0–100.0)
MONOS PCT: 6 % (ref 3–12)
Monocytes Absolute: 0.5 10*3/uL (ref 0.1–1.0)
NEUTROS PCT: 60 % (ref 43–77)
Neutro Abs: 5.1 10*3/uL (ref 1.7–7.7)
Platelets: 381 10*3/uL (ref 150–400)
RBC: 3.57 MIL/uL — ABNORMAL LOW (ref 3.87–5.11)
RDW: 12.8 % (ref 11.5–15.5)
WBC: 8.4 10*3/uL (ref 4.0–10.5)

## 2014-10-31 LAB — COMPREHENSIVE METABOLIC PANEL
ALT: 63 U/L — ABNORMAL HIGH (ref 14–54)
AST: 38 U/L (ref 15–41)
Albumin: 3.5 g/dL (ref 3.5–5.0)
Alkaline Phosphatase: 103 U/L (ref 38–126)
Anion gap: 11 (ref 5–15)
BUN: 9 mg/dL (ref 6–20)
CO2: 29 mmol/L (ref 22–32)
CREATININE: 0.95 mg/dL (ref 0.44–1.00)
Calcium: 9.2 mg/dL (ref 8.9–10.3)
Chloride: 100 mmol/L — ABNORMAL LOW (ref 101–111)
GFR calc non Af Amer: 60 mL/min — ABNORMAL LOW (ref >60)
Glucose, Bld: 132 mg/dL — ABNORMAL HIGH (ref 65–99)
POTASSIUM: 3.1 mmol/L — AB (ref 3.5–5.1)
SODIUM: 140 mmol/L (ref 135–145)
TOTAL PROTEIN: 7 g/dL (ref 6.5–8.1)
Total Bilirubin: 0.8 mg/dL (ref 0.3–1.2)

## 2014-10-31 LAB — LACTIC ACID, PLASMA
Lactic Acid, Venous: 1.3 mmol/L (ref 0.5–2.0)
Lactic Acid, Venous: 1.9 mmol/L (ref 0.5–2.0)

## 2014-10-31 LAB — LIPASE, BLOOD: Lipase: 15 U/L — ABNORMAL LOW (ref 22–51)

## 2014-10-31 LAB — TROPONIN I

## 2014-10-31 MED ORDER — POTASSIUM CHLORIDE CRYS ER 20 MEQ PO TBCR
40.0000 meq | EXTENDED_RELEASE_TABLET | Freq: Once | ORAL | Status: AC
  Administered 2014-10-31: 40 meq via ORAL
  Filled 2014-10-31: qty 2

## 2014-10-31 MED ORDER — HYDROMORPHONE HCL 1 MG/ML IJ SOLN
1.0000 mg | Freq: Once | INTRAMUSCULAR | Status: AC
  Administered 2014-10-31: 1 mg via INTRAVENOUS
  Filled 2014-10-31: qty 1

## 2014-10-31 MED ORDER — IOHEXOL 350 MG/ML SOLN
100.0000 mL | Freq: Once | INTRAVENOUS | Status: AC | PRN
  Administered 2014-10-31: 100 mL via INTRAVENOUS

## 2014-10-31 MED ORDER — ONDANSETRON HCL 4 MG/2ML IJ SOLN
4.0000 mg | INTRAMUSCULAR | Status: DC | PRN
  Administered 2014-10-31: 4 mg via INTRAVENOUS
  Filled 2014-10-31: qty 2

## 2014-10-31 MED ORDER — ONDANSETRON HCL 4 MG PO TABS: 4.0000 mg | ORAL_TABLET | Freq: Three times a day (TID) | ORAL | Status: DC | PRN

## 2014-10-31 NOTE — ED Notes (Signed)
Patient ambulated from room 15 to bathroom next to room 18 without difficulty. 2 person assist with ambulation since being unable to locate walker.

## 2014-10-31 NOTE — ED Notes (Signed)
Pt comes in by [facility] for near syncopal episodes when trying to stand, pt states when standing she gets dizzy and tunnel vision . Pt had recent hip surgery on Aug 2nd. Pt is not pain medications because of nausea that occurs when she takes them. Pt states she has been unable to keep down any fluids or food in the last couple days. NAD noted. Pt is alert and oriented at this time.

## 2014-10-31 NOTE — ED Provider Notes (Signed)
CSN: [id number]     Arrival date & time 10/31/14  1745 History   First MD Initiated Contact with Patient 10/31/14 1747     Chief Complaint  Patient presents with  . Near Syncope      HPI Pt was seen at 1800. Per pt and her family, c/o gradual onset and worsening of persistent lightheadedness for the past 5 days. Pt states she "gets dizzy" and "has tunnel vision" when she moves from sit to stand. Pt states she has had poor PO intake for the past 5 days due to nausea and has not taken her pain meds for s/p right THR 5 days ago. Pt states her "potassium was low" when she was in the hospital and "is due to get it rechecked." Pt also c/o anterior right knee "pain" and was "told to get it checked." Denies calf pain/swelling, no CP/palpitations, no SOB/cough, no fevers, no abd pain, no vomiting/diarrhea, no focal motor weakness, no tingling/numbness in extremities.    Past Medical History  Diagnosis Date  . Hypertension   . Chronic shoulder pain   . Chronic pelvic pain in female     pelvic joint and thigh  . Chronic back pain   . Chronic right hip pain   . Sciatica   . PONV (postoperative nausea and vomiting)   . Pneumonia     hx of  . 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 Frequency of urination   . Arthritis    Past Surgical History  Procedure Laterality Date  . Lumbago    . Neuralgia neuritis    . Radiculitis    . Partial hysterectomy    . Abdominal hysterectomy    . Cholecystectomy    . Colonoscopy w/ polypectomy    . Back surgery    . Total hip arthroplasty Right 10/26/2014    Procedure: TOTAL HIP ARTHROPLASTY;  Surgeon: Valeria Batman, MD;  Location: [facility] OR;  Service: Orthopedics;  Laterality: Right;   Family History  Problem Relation Age of Onset  . Hypertension Mother   . Hypertension Father    History  Substance Use Topics  . Smoking status: Current Every Day Smoker -- 0.25 packs/day for 53 years  . Smokeless tobacco: Not on file  . Alcohol Use: No    Review  of Systems ROS: Statement: All systems negative except as marked or noted in the HPI; Constitutional: Negative for fever and chills. ; ; Eyes: Negative for eye pain, redness and discharge. ; ; ENMT: Negative for ear pain, hoarseness, nasal congestion, sinus pressure and sore throat. ; ; Cardiovascular: Negative for chest pain, palpitations, diaphoresis, dyspnea, and peripheral edema. ; ; Respiratory: Negative for cough, wheezing and stridor. ; ; Gastrointestinal: +nausea, poor PO intake. Negative for vomiting, diarrhea, abdominal pain, blood in stool, hematemesis, jaundice and rectal bleeding. . ; ; Genitourinary: Negative for dysuria, flank pain and hematuria. ; ; Musculoskeletal: +right knee pain. Negative for back pain and neck pain. Negative for swelling and trauma.; ; Skin: Negative for pruritus, rash, abrasions, blisters, bruising and skin lesion.; ; Neuro: +lightheadness. Negative for headache and neck stiffness. Negative for altered level of consciousness , altered mental status, extremity weakness, paresthesias, involuntary movement, seizure and syncope.      Allergies  Clarithromycin; Codeine; Hydrocodone; Penicillins; Sulfa antibiotics; and Oxycodone hcl  Home Medications   Prior to Admission medications   Medication Sig Start Date End Date Taking? Authorizing Provider  amLODipine (NORVASC) 10 MG tablet  Take 10 mg by mouth daily.    Historical Provider, MD  HYDROmorphone (DILAUDID) 2 MG tablet Take 1 tablet (2 mg total) by mouth every 4 (four) hours as needed for severe pain (1 - 2 TABLETS Q 4H PRN PAIN). 10/28/14   Jetty Peeks, PA-C  meloxicam (MOBIC) 7.5 MG tablet Take 7.5 mg by mouth 2 (two) times daily. 08/06/14   Historical Provider, MD  methocarbamol (ROBAXIN) 500 MG tablet Take 1 tablet (500 mg total) by mouth every 8 (eight) hours as needed for muscle spasms. 10/28/14   Jetty Peeks, PA-C  ondansetron (ZOFRAN ODT) 8 MG disintegrating tablet 8mg  ODT q4 hours prn nausea Patient  not taking: Reported on 10/12/2014 03/21/12   Geoffery Lyons, MD  rivaroxaban (XARELTO) 10 MG TABS tablet Take 1 tablet (10 mg total) by mouth daily with breakfast. 10/28/14   Oris Drone Petrarca, PA-C   BP 139/82 mmHg  Pulse 90  Temp(Src) 98.8 F (37.1 C) (Oral)  Resp 20  Ht 5\' 8"  (1.727 m)  Wt 172 lb (78.019 kg)  BMI 26.16 kg/m2  SpO2 98%   18:08 Orthostatic Vital Signs JM  Orthostatic Lying  - BP- Lying: 139/82 mmHg ; Pulse- Lying: 90  Orthostatic Sitting - BP- Sitting: 145/76 mmHg ; Pulse- Sitting: 96  Orthostatic Standing at 0 minutes - BP- Standing at 0 minutes: 150/77 mmHg ; Pulse- Standing at 0 minutes: 101      Physical Exam  1805: Physical examination:  Nursing notes reviewed; Vital signs and O2 SAT reviewed;  Constitutional: Well developed, Well nourished, Well hydrated, In no acute distress; Head:  Normocephalic, atraumatic; Eyes: EOMI, PERRL, No scleral icterus; ENMT: Mouth and pharynx normal, Mucous membranes moist; Neck: Supple, Full range of motion, No lymphadenopathy; Cardiovascular: Regular rate and rhythm, No gallop; Respiratory: Breath sounds clear & equal bilaterally, No rales, rhonchi, wheezes.  Speaking full sentences with ease, Normal respiratory effort/excursion; Chest: Nontender, Movement normal; Abdomen: Soft, Nontender, Nondistended, Normal bowel sounds; Genitourinary: No CVA tenderness; Extremities: Pulses normal, +right anterior knee tenderness to palp, no edema, no deformity, no erythema, no ecchymosis. +FROM right knee, including able to lift extended RLE off stretcher, and extend right lower leg against resistance.  No ligamentous laxity.  No patellar or quad tendon step-offs.  NMS intact right foot, strong pedal pp. +plantarflexion of right foot w/calf squeeze.  No palpable gap right Achilles's tendon.  No proximal fibular head tenderness.  No edema, erythema, warmth, ecchymosis or deformity. No calf tenderness, edema or asymmetry.; Neuro: AA&Ox3, Major CN grossly  intact.  Speech clear. No gross focal motor or sensory deficits in extremities.; Skin: Color normal, Warm, Dry.   ED Course  Procedures     EKG Interpretation   Date/Time:  Sunday October 31 2014 17:47:05 EDT Ventricular Rate:  90 PR Interval:  180 QRS Duration: 82 QT Interval:  384 QTC Calculation: 470 R Axis:   -23 Text Interpretation:  Sinus rhythm Left ventricular hypertrophy  Nonspecific ST and T wave abnormality When compared with ECG of 10/15/2014  Nonspecific ST and T wave abnormality is now Present Confirmed by [facility]   MD, Nicholos Johns [phone]) on 10/31/2014 7:00:28 PM      MDM  MDM Reviewed: previous chart, nursing note and vitals Reviewed previous: labs and ECG Interpretation: labs, ECG and x-ray     Results for orders placed or performed during the hospital encounter of 10/31/14  Urinalysis, Routine w reflex microscopic (not at [facility])  Result Value Ref Range  Color, Urine YELLOW YELLOW   APPearance HAZY (A) CLEAR   Specific Gravity, Urine <1.005 (L) 1.005 - 1.030   pH 6.5 5.0 - 8.0   Glucose, UA NEGATIVE NEGATIVE mg/dL   Hgb urine dipstick NEGATIVE NEGATIVE   Bilirubin Urine NEGATIVE NEGATIVE   Ketones, ur NEGATIVE NEGATIVE mg/dL   Protein, ur NEGATIVE NEGATIVE mg/dL   Urobilinogen, UA 1.0 0.0 - 1.0 mg/dL   Nitrite NEGATIVE NEGATIVE   Leukocytes, UA NEGATIVE NEGATIVE  Comprehensive metabolic panel  Result Value Ref Range   Sodium 140 135 - 145 mmol/L   Potassium 3.1 (L) 3.5 - 5.1 mmol/L   Chloride 100 (L) 101 - 111 mmol/L   CO2 29 22 - 32 mmol/L   Glucose, Bld 132 (H) 65 - 99 mg/dL   BUN 9 6 - 20 mg/dL   Creatinine, Ser 8.11 0.44 - 1.00 mg/dL   Calcium 9.2 8.9 - 91.4 mg/dL   Total Protein 7.0 6.5 - 8.1 g/dL   Albumin 3.5 3.5 - 5.0 g/dL   AST 38 15 - 41 U/L   ALT 63 (H) 14 - 54 U/L   Alkaline Phosphatase 103 38 - 126 U/L   Total Bilirubin 0.8 0.3 - 1.2 mg/dL   GFR calc non Af Amer 60 (L) >60 mL/min   GFR calc Af Amer >60 >60 mL/min   Anion gap 11 5  - 15  Lipase, blood  Result Value Ref Range   Lipase 15 (L) 22 - 51 U/L  Lactic acid, plasma  Result Value Ref Range   Lactic Acid, Venous 1.3 0.5 - 2.0 mmol/L  Lactic acid, plasma  Result Value Ref Range   Lactic Acid, Venous 1.9 0.5 - 2.0 mmol/L  CBC with Differential  Result Value Ref Range   WBC 8.4 4.0 - 10.5 K/uL   RBC 3.57 (L) 3.87 - 5.11 MIL/uL   Hemoglobin 9.9 (L) 12.0 - 15.0 g/dL   HCT 78.2 (L) 95.6 - 21.3 %   MCV 85.7 78.0 - 100.0 fL   MCH 27.7 26.0 - 34.0 pg   MCHC 32.4 30.0 - 36.0 g/dL   RDW 08.6 57.8 - 46.9 %   Platelets 381 150 - 400 K/uL   Neutrophils Relative % 60 43 - 77 %   Neutro Abs 5.1 1.7 - 7.7 K/uL   Lymphocytes Relative 30 12 - 46 %   Lymphs Abs 2.5 0.7 - 4.0 K/uL   Monocytes Relative 6 3 - 12 %   Monocytes Absolute 0.5 0.1 - 1.0 K/uL   Eosinophils Relative 4 0 - 5 %   Eosinophils Absolute 0.3 0.0 - 0.7 K/uL   Basophils Relative 0 0 - 1 %   Basophils Absolute 0.0 0.0 - 0.1 K/uL  Troponin I  Result Value Ref Range   Troponin I <0.03 <0.031 ng/mL   Ct Angio Chest Pe W/cm &/or Wo Cm 10/31/2014   CLINICAL DATA:  Recent total hip replacement near syncope and dizziness.  EXAM: CT ANGIOGRAPHY CHEST WITH CONTRAST  TECHNIQUE: Multidetector CT imaging of the chest was performed using the standard protocol during bolus administration of intravenous contrast. Multiplanar CT image reconstructions and MIPs were obtained to evaluate the vascular anatomy.  CONTRAST:  OMNIPAQUE IOHEXOL 350 MG/ML SOLN  COMPARISON:  Chest radiograph October 31, 2014 ; CT abdomen and pelvis January 25, 2012  FINDINGS: There is no demonstrable pulmonary embolus. There is atherosclerotic change in the aortic arch region, but there is no demonstrable thoracic aortic aneurysm or  dissection. The visualized great vessels show mild atherosclerotic change but otherwise appear unremarkable.  There is patchy atelectatic change in the right lower lobe posteriorly. Milder atelectatic changes also noted  in the posterior segments of each upper lobe. Some of this atelectatic changes likely due to dependent positioning. There is no frank edema or consolidation.  Thyroid appears unremarkable. There is no appreciable thoracic adenopathy. There are foci of coronary artery calcification. The pericardium is not thickened.  In the visualized upper abdomen, there is hepatic steatosis. 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 There is atherosclerotic change in the visualized upper abdomen.  There is degenerative change in the thoracic spine. There are no blastic or lytic bone lesions.  Review of the MIP images confirms the above findings.  IMPRESSION: There is no demonstrable pulmonary embolus. There are areas of atelectatic change but no frank edema or consolidation. No adenopathy. There is hepatic steatosis. 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   Electronically Signed   By: Bretta Bang III M.D.   On: 10/31/2014 20:59   Dg Chest Port 1 View 10/31/2014   CLINICAL DATA:  Near syncope  EXAM: PORTABLE CHEST - 1 VIEW  COMPARISON:  10/28/2014  FINDINGS: Lungs are clear.  No pleural effusion or pneumothorax.  The heart is normal in size.  IMPRESSION: No evidence of acute cardiopulmonary disease.   Electronically Signed   By: Charline Bills M.D.   On: 10/31/2014 18:50   Dg Knee Complete 4 Views Right 10/31/2014   CLINICAL DATA:  Sharp constant aching type pain in right knee, progressing  EXAM: RIGHT KNEE - COMPLETE 4+ VIEW  COMPARISON:  April 28, 2013  FINDINGS: Frontal, lateral, and bilateral oblique views were obtained. There is no fracture or dislocation. There is no joint effusion. There is mild narrowing medially. There is a small spur arising from the anterior superior patella. There are foci of arterial vascular calcification.  IMPRESSION: Mild narrowing medially. No fracture or joint effusion. Areas of arterial vascular calcification.   Electronically Signed   By: Bretta Bang III M.D.   On:  10/31/2014 19:37    2245:  EKG with NS STTW changes but troponin negative and pt denies CP/SOB (at rest or on exertion) today or at any time over the past week.  Pt is not orthostatic on VS. Pt has ambulated with 2 assist (no walker available) with steady gait, easy resps, NAD. Denied any complaints. H/H per baseline. Potassium repleted PO. Pt concerned regarding "a blood clot" in her RLE as cause for her anterior knee pain. Lengthy d/w pt and family, but pt insistent. Will obtain outpt Vasc US tomorrow morning. Pt states she "feels better after the pain medicine and the potassium" and wants to go home now. Dx and testing d/w pt and family.  Questions answered.  Verb understanding, agreeable to d/c home with outpt f/u.   Samuel Jester, DO 11/03/14 [phone]

## 2014-10-31 NOTE — Discharge Instructions (Signed)
°Emergency Department Resource Guide °1) Find a Doctor and Pay Out of Pocket °Although you won't have to find out who is covered by your insurance plan, it is a good idea to ask around and get recommendations. You will then need to call the office and see if the doctor you have chosen will accept you as a new patient and what types of options they offer for patients who are self-pay. Some doctors offer discounts or will set up payment plans for their patients who do not have insurance, but you will need to ask so you aren't surprised when you get to your appointment. ° °2) Contact Your Local Health Department °Not all health departments have doctors that can see patients for sick visits, but many do, so it is worth a call to see if yours does. If you don't know where your local health department is, you can check in your phone book. The CDC also has a tool to help you locate your state's health department, and many state websites also have listings of all of their local health departments. ° °3) Find a Walk-in Clinic °If your illness is not likely to be very severe or complicated, you may want to try a walk in clinic. These are popping up all over the country in pharmacies, drugstores, and shopping centers. They're usually staffed by nurse practitioners or physician assistants that have been trained to treat common illnesses and complaints. They're usually fairly quick and inexpensive. However, if you have serious medical issues or chronic medical problems, these are probably not your best option. ° °No Primary Care Doctor: °- Call [facility] at  832-8000 - they can help you locate a primary care doctor that  accepts your insurance, provides certain services, etc. °- Physician Referral Service- 1-800-533-3463 ° °Chronic Pain Problems: °Organization         Address  Phone   Notes  °[facility]  (336) 297-2271 Patients need to be referred by their primary care doctor.  ° °Medication  Assistance: °Organization         Address  Phone   Notes  °[place] [facility] [address] °[place], [place] [address] (336) 641-8030 --Must be a resident of [place] °-- Must have NO insurance coverage whatsoever (no Medicaid/ Medicare, etc.) °-- The pt. MUST have a primary care doctor that directs their care regularly and follows them in the community °  °[facility]  (866) 331-1348   °[employer]  (888) 892-1162   ° °Agencies that provide inexpensive medical care: °Organization         Address  Phone   Notes  °[facility]  (336) 832-8035   °[facility]    (336) 832-7272   °[facility] [address] °[place], [place] [address] (336) 832-4777   °[facility] [address], °[place] (336) 271-4999   °[facility]    (336) 373-0678   °[facility]    (336) 272-1050   °[facility] ° [address], [place] Phone:  (336) 832-4444, Fax:  (336) 832-4440 Hours of Operation:  9 am - 6 pm, M-F.  Also accepts Medicaid/Medicare and self-pay.  °[facility] ° [address], [place] Phone: (336) 832-3150, Fax: (336) 832-3151. Hours of Operation:  8:30 am - 5:30 pm, M-F.  Also accepts Medicaid and self-pay.  °[facility] [place] [address], [place] Phone: (336) 878-6027   °[facility] [address], [place], [place] (336)723-1848, Ext. 123 Mondays & Thursdays: 7-9 AM.  First 15 patients are seen on a first come, first serve basis. °  ° °Medicaid-accepting [place] Providers: ° °Organization         Address  Phone   Notes  °[facility] [address], [place] (336) 641-2100 Also accepts self-pay patients.  °[facility] [address], [place] ° (336) 856-9996   °[facility] [address], [place]  (336) 288-8857   °[facility] [address] [place] [address], [place] (336) 299-7000   °Veita Bland [address], [place]  ° (336) 373-1557 Only accepts [place] Access Medicaid patients after they have their name applied to their card.  ° °Self-Pay (no insurance) in [place]: ° °Organization         Address  Phone   Notes  °Sickle Cell Patients, [facility] [address], [place] (336) 832-1970   °[facility] [address], [place] (336) 832-4400   °[facility] ° [address], [place] (336) 992-4800   °[facility]/Dr. Osei-Bonsu ° [address] [place] [address], [place] or [address], [place] (336) 841-8500 Phone number for both [place] and [place] locations is the same.  °[facility] [address], [place] (336) 299-0000   °[facility] [address] [place] [address], [place] or [address] (336) 852-7530 °(336) 878-2260   °[facility] [address], [place] (336) 350-1642, phone; (336) 294-5005, fax Sees patients 1st and 3rd Saturday of every month.  Must not qualify for public or private insurance (i.e. Medicaid, Medicare, [place] Health Choice, Veterans' Benefits) • Household income should be no more than 200% of the poverty level •The clinic cannot treat you if you are pregnant or think you are pregnant • Sexually transmitted diseases are not treated at the clinic.  ° ° °Dental Care: °Organization         Address  Phone  Notes  °[place] [facility] [address], [place] (336) 641-6152 Accepts children up to age 21 who are enrolled in Medicaid or [place] Health Choice; pregnant women with a Medicaid card; and children who have applied for Medicaid or [place] Health Choice, but were declined, whose parents can pay a reduced fee at time of service.  °[place]  [facility] [place]  [address], [place] (336) 641-7733 Accepts children up to age 21 who are enrolled in Medicaid or [place] Health Choice; pregnant women with a Medicaid card; and children who have applied for Medicaid or [place] Health Choice, but were declined, whose parents can pay a reduced fee at time of service.  °[facility] ° [address], [place] (336) 641-4533 Patients are seen by appointment only. Walk-ins are not accepted. [facility] will see patients 18 years of age and older. °Monday - Tuesday (8am-5pm) °Most Wednesdays (8:30-5pm) °$30 per visit, cash only  °[facility] ° [address], [place] (336) 641-4533 Patients are seen by appointment only. Walk-ins are not accepted. [facility] will see patients 18 years of age and older. °One   Wednesday Evening (Monthly: Volunteer Based).  $30 per visit, cash only  °[facility]  (919) 537-3737 for adults; Children under age 4, call Graduate Pediatric Dentistry at (919) [phone]. Children aged 4-14, please call (919) [phone] to request a pediatric application. ° Dental services are provided in all areas of dental care including fillings, crowns and bridges, complete and partial dentures, implants, gum treatment, root canals, and extractions. Preventive care is also provided. Treatment is provided to both adults and children. °Patients are selected via a lottery and there is often a waiting list. °  °[facility] [address], °[place] ° (336) 763-8833 [email or web address] °  °[facility] [address], [place], [place] (336)723-1848, Ext. 123 Second and Fourth Thursday of each month, opens at 6:30 AM; Clinic ends at 9 AM.  Patients are seen on a first-come first-served basis, and a limited number are seen during each clinic.  ° °[facility] ° [address], [place], [place] (336) 723-7904    Eligibility Requirements °You must have lived in [place], [place], or [place] counties for at least the last three months. °  You cannot be eligible for state or federal sponsored healthcare insurance, including Veterans Administration, Medicaid, or Medicare. °  You generally cannot be eligible for healthcare insurance through your employer.  °  How to apply: °Eligibility screenings are held every Tuesday and Wednesday afternoon from 1:00 pm until 4:00 pm. You do not need an appointment for the interview!  °[facility] [address], [place], [place] 336-631-2330   °[facility]  336-342-8273   °[facility]  336-703-3100   °[facility]  336-570-6415   ° °Behavioral Health Resources in the Community: °Intensive Outpatient Programs °Organization         Address  Phone  Notes  °[facility] [address], [place], [place] 336-878-6098   °[facility] [address], [place], [place] 336-832-9800   °[facility] [address], [place], [place] ° 336-882-2125   °[facility] [address],  °[place], [place] 1-800-853-5163 or 336-641-4981   °Substance Abuse Resources °Organization         Address  Phone  Notes  °[facility]  336-882-2125   °[facility]  336-784-9470   °[facility]  336-285-9073   °[facility]  336-845-3988   °Residential & Outpatient Substance Abuse Program  1-800-659-3381   °Psychological Services °Organization         Address  Phone  Notes  °[facility]  336- 832-9600   °[facility]  336- 378-7881   °[facility] [address], [place] 1-800-853-5163 or 336-641-4981   ° °Mobile Crisis Teams °Organization         Address  Phone  Notes  °[facility], Mobile Crisis Care Unit  1-877-626-1772   °Assertive °[facility] ° [address]  [place], [place] 336-834-9664   °Sharon DeEsch [address] °[place] 336-554-5454   ° °Self-Help/Support Groups °Organization         Address  Phone             Notes  °[facility]  - variety of support groups  336- 373-1402 Call for more information  °Narcotics Anonymous (NA), [facility] [address], °[place]  2 meetings at this location  ° °  Residential Treatment Programs Organization         Address  Phone  Notes  [facility] [address],    [place]  1-610-960-4540   [facility]  [address] [place] [address], [place], [place] 478-295-6213   [facility] [address] [place], [place] 086-578-4696 Admissions: 8am-3pm M-F  [facility] [address]    [place], [place] 295-284-1324   [facility] [address] [place], [place], [place] 401-027-2536   [facility] [address]  [place], [place] 644-034-7425   [facility] Intensive Outpatient [address], [place], [place] 956-387-5643   [facility] ([facility] [address] [place]  [place], [place] 3-295-188-4166 or (480)863-1880   [facility] ([facility] [address] [place], [place] 323-557-3220 Accepts Medicaid  [facility] [address]  [employer] [place] 2-542-706-2376 Substance Abuse/Addiction Treatment   [facility]         Address  Phone  Notes  [facility]  (506)445-4794   Angie Fava, PhD [address] [place], [place]   (786) 339-2543 or 364-233-6640   [facility]   [address] [place] [address] [place], [place] (662)236-5055   [facility] [address], [place], [place] 475-328-5831 Insurance/Medicaid/sponsorship through [facility] [address]                                    [place], [place] [phone] Therapy/tele-psych/case    [facility] [address], [place] [phone]    Dr. Lolly Mustache  430-011-7357   [facility]  [employer] [facility] Dept. 1) [address] [place] 2) [address] [place] 3)  [address], [place] 417-198-2005 (308)[phone]  909-816-4135   [facility] Child Abuse Hotline (819)028-8516 or 8170625241 (After Hours)      Take the prescription as directed. Continue to take your usual prescriptions as previously directed.  Apply moist heat or ice to the area(s) of discomfort, for 15 minutes at a time, several times per day for the next few days.  Do not fall asleep on a heating or ice pack. Return tomorrow for your outpatient ultrasound of your right leg (to check for blood clot). Call your regular medical doctor and your Orthopedic doctor tomorrow to schedule a follow up appointment in the next 2 days.  Return to the Emergency Department immediately if worsening.

## 2014-11-04 ENCOUNTER — Emergency Department (HOSPITAL_COMMUNITY)
Admission: EM | Admit: 2014-11-04 | Discharge: 2014-11-04 | Disposition: A | Discharge status: Home / Self Care | Payer: Medicare HMO | Attending: Emergency Medicine | Admitting: Emergency Medicine

## 2014-11-04 ENCOUNTER — Encounter (HOSPITAL_COMMUNITY): Payer: Self-pay | Admitting: Emergency Medicine

## 2014-11-04 DIAGNOSIS — R21 Rash and other nonspecific skin eruption: Secondary | ICD-10-CM | POA: Insufficient documentation

## 2014-11-04 DIAGNOSIS — I10 Essential (primary) hypertension: Secondary | ICD-10-CM | POA: Insufficient documentation

## 2014-11-04 DIAGNOSIS — Z8701 Personal history of pneumonia (recurrent): Secondary | ICD-10-CM | POA: Diagnosis not present

## 2014-11-04 DIAGNOSIS — Z880 Allergy status to penicillin: Secondary | ICD-10-CM | POA: Diagnosis not present

## 2014-11-04 DIAGNOSIS — M7981 Nontraumatic hematoma of soft tissue: Secondary | ICD-10-CM | POA: Insufficient documentation

## 2014-11-04 DIAGNOSIS — G8929 Other chronic pain: Secondary | ICD-10-CM | POA: Insufficient documentation

## 2014-11-04 DIAGNOSIS — Z791 Long term (current) use of non-steroidal anti-inflammatories (NSAID): Secondary | ICD-10-CM | POA: Diagnosis not present

## 2014-11-04 DIAGNOSIS — Z79899 Other long term (current) drug therapy: Secondary | ICD-10-CM | POA: Insufficient documentation

## 2014-11-04 DIAGNOSIS — R61 Generalized hyperhidrosis: Secondary | ICD-10-CM | POA: Insufficient documentation

## 2014-11-04 DIAGNOSIS — Z720 Tobacco use: Secondary | ICD-10-CM | POA: Diagnosis not present

## 2014-11-04 DIAGNOSIS — M79604 Pain in right leg: Secondary | ICD-10-CM | POA: Insufficient documentation

## 2014-11-04 DIAGNOSIS — R200 Anesthesia of skin: Secondary | ICD-10-CM | POA: Diagnosis not present

## 2014-11-04 LAB — CBC WITH DIFFERENTIAL/PLATELET
BASOS ABS: 0 10*3/uL (ref 0.0–0.1)
Basophils Relative: 0 % (ref 0–1)
EOS ABS: 0.4 10*3/uL (ref 0.0–0.7)
EOS PCT: 4 % (ref 0–5)
HCT: 30.1 % — ABNORMAL LOW (ref 36.0–46.0)
Hemoglobin: 9.6 g/dL — ABNORMAL LOW (ref 12.0–15.0)
LYMPHS ABS: 2.7 10*3/uL (ref 0.7–4.0)
Lymphocytes Relative: 29 % (ref 12–46)
MCH: 27.6 pg (ref 26.0–34.0)
MCHC: 31.9 g/dL (ref 30.0–36.0)
MCV: 86.5 fL (ref 78.0–100.0)
Monocytes Absolute: 0.4 10*3/uL (ref 0.1–1.0)
Monocytes Relative: 4 % (ref 3–12)
Neutro Abs: 5.8 10*3/uL (ref 1.7–7.7)
Neutrophils Relative %: 63 % (ref 43–77)
PLATELETS: 517 10*3/uL — AB (ref 150–400)
RBC: 3.48 MIL/uL — ABNORMAL LOW (ref 3.87–5.11)
RDW: 13.2 % (ref 11.5–15.5)
WBC: 9.3 10*3/uL (ref 4.0–10.5)

## 2014-11-04 LAB — BASIC METABOLIC PANEL
ANION GAP: 9 (ref 5–15)
BUN: 7 mg/dL (ref 6–20)
CO2: 29 mmol/L (ref 22–32)
Calcium: 9.2 mg/dL (ref 8.9–10.3)
Chloride: 101 mmol/L (ref 101–111)
Creatinine, Ser: 0.94 mg/dL (ref 0.44–1.00)
GFR calc Af Amer: >60 mL/min (ref >60)
Glucose, Bld: 122 mg/dL — ABNORMAL HIGH (ref 65–99)
POTASSIUM: 3.1 mmol/L — AB (ref 3.5–5.1)
SODIUM: 139 mmol/L (ref 135–145)

## 2014-11-04 MED ORDER — LEVOFLOXACIN 500 MG PO TABS: 500.0000 mg | ORAL_TABLET | Freq: Every day | ORAL | Status: DC

## 2014-11-04 NOTE — ED Provider Notes (Signed)
CSN: [id number]     Arrival date & time 11/04/14  1953 History  This chart was scribed for Bethann Berkshire, MD by Doreatha Martin, ED Scribe. This patient was seen in room APA16A/APA16A and the patient's care was started at 8:27 PM.     Chief Complaint  Patient presents with  . Post-op Problem   Patient is a 69 y.o. female presenting with rash. The history is provided by the patient (Pt c/o rashes on the right leg). No language interpreter was used.  Rash Location:  Leg Leg rash location:  R upper leg, R knee and R ankle Quality: redness   Severity:  Moderate Onset quality:  Gradual Duration:  1 day Timing:  Constant Chronicity:  New Context comment:  S/p hip replacement  Relieved by:  Nothing Ineffective treatments:  None tried Associated symptoms: no abdominal pain, no diarrhea, no fatigue and no headaches    HPI Comments: Jessica Hoover is a 69 y.o. female who presents to the Emergency Department complaining of moderate rashes on the right calf, ankle and hip onset yesterday and worsened today. She states associated leg swelling, numbness in the right foot, diaphoresis. Pt states that she had a hip replacement by Dr. Cleophas Dunker 9 days ago. She states that she began having episodes of syncope 6 days ago. Pt was seen in the ED on 10/31/14 for near syncope. She notes that she celled Dr. Cleophas Dunker yesterday to follow up and had a negative sonogram, but was Dx with an infection. She was taken off Xarelto yesterday. Pt notes that she called a nurse today at 1500 and was advised to come into the ED. She denies fever.    Past Medical History  Diagnosis Date  . Hypertension   . Chronic shoulder pain   . Chronic pelvic pain in female     pelvic joint and thigh  . Chronic back pain   . Chronic right hip pain   . Sciatica   . PONV (postoperative nausea and vomiting)   . Pneumonia     hx of  . 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 Frequency of urination   . Arthritis    Past Surgical History   Procedure Laterality Date  . Lumbago    . Neuralgia neuritis    . Radiculitis    . Partial hysterectomy    . Abdominal hysterectomy    . Cholecystectomy    . Colonoscopy w/ polypectomy    . Back surgery    . 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 Hypertension Mother   . Hypertension Father    Social History  Substance Use Topics  . Smoking status: Current Every Day Smoker -- 0.25 packs/day for 53 years  . Smokeless tobacco: None  . Alcohol Use: No   OB History    No data available     Review of Systems  Constitutional: Positive for diaphoresis. Negative for appetite change and fatigue.  HENT: Negative for congestion, ear discharge and sinus pressure.   Eyes: Negative for discharge.  Respiratory: Negative for cough.   Cardiovascular: Positive for leg swelling. Negative for chest pain.  Gastrointestinal: Negative for abdominal pain and diarrhea.  Genitourinary: Negative for frequency and hematuria.  Musculoskeletal: Negative for back pain.  Skin: Positive for rash.  Neurological: Positive for numbness. Negative for seizures and headaches.  Psychiatric/Behavioral: Negative for hallucinations.   Allergies  Clarithromycin; Codeine; Hydrocodone; Penicillins; Sulfa antibiotics; and Oxycodone hcl  Home Medications   Prior to Admission medications   Medication Sig Start Date End Date Taking? Authorizing Provider  amLODipine (NORVASC) 10 MG tablet Take 10 mg by mouth daily.    Historical Provider, MD  HYDROmorphone (DILAUDID) 2 MG tablet Take 1 tablet (2 mg total) by mouth every 4 (four) hours as needed for severe pain (1 - 2 TABLETS Q 4H PRN PAIN). 10/28/14   Jetty Peeks, PA-C  meloxicam (MOBIC) 7.5 MG tablet Take 7.5 mg by mouth 2 (two) times daily. 08/06/14   Historical Provider, MD   methocarbamol (ROBAXIN) 500 MG tablet Take 1 tablet (500 mg total) by mouth every 8 (eight) hours as needed for muscle spasms. 10/28/14   Jetty Peeks, PA-C  ondansetron (ZOFRAN ODT) 8 MG disintegrating tablet  ODT q4 hours prn nausea Patient not taking: Reported on 10/12/2014 03/21/12   Geoffery Lyons, MD  ondansetron (ZOFRAN) 4 MG tablet Take 1 tablet (4 mg total) by mouth every 8 (eight) hours as needed for nausea or vomiting. 10/31/14   Samuel Jester, DO  rivaroxaban (XARELTO) 10 MG TABS tablet Take 1 tablet (10 mg total) by mouth daily with breakfast. 10/28/14   Oris Drone Petrarca, PA-C   BP 146/57 mmHg  Pulse 89  Temp(Src) 98.8 F (37.1 C) (Oral)  Resp 20  Ht  (1.727 m)  Wt 172 lb (78.019 kg)  BMI 26.16 kg/m2  SpO2 100% Physical Exam  Constitutional: She is oriented to person, place, and time. She appears well-developed.  HENT:  Head: Normocephalic.  Eyes: Conjunctivae and EOM are normal. No scleral icterus.  Neck: Neck supple. No thyromegaly present.  Cardiovascular: Normal rate and regular rhythm.  Exam reveals no gallop and no friction rub.   No murmur heard. Pulmonary/Chest: No stridor. She has no wheezes. She has no rales. She exhibits no tenderness.  Abdominal: She exhibits no distension. There is no tenderness. There is no rebound.  Musculoskeletal: Normal range of motion. She exhibits edema.  Swelling through the calf and foot on the right side  Lymphadenopathy:    She has no cervical adenopathy.  Neurological: She is oriented to person, place, and time. She exhibits normal muscle tone. Coordination normal.  Skin: No rash noted. No erythema.  Ecchymosis distal to the knee on the right and left legs.  Psychiatric: She has a normal mood and affect. Her behavior is normal.  Nursing note and vitals reviewed.  ED Course  Procedures (including critical care time) DIAGNOSTIC STUDIES: Oxygen Saturation is 100% on RA, normal by my interpretation.    COORDINATION OF  CARE: 8:32 PM Discussed treatment plan with pt at bedside and pt agreed to plan.   Labs Review Labs Reviewed - No data to display  Imaging Review No results found.    EKG Interpretation None      MDM   Final diagnoses:  None   Pt s/p hip surgery on the right.  She believes she has a cellulitis in her right lower leg,  dvt neg from previous day.  Mild rash to right lower leg,  Unlikely cellulitis probably just bruising.  Pt will start levaquin  and follow up this week.with her md  The chart was scribed for me under my direct supervision.  I personally performed the history, physical, and medical decision making and all procedures  in the evaluation of this patient.Bethann Berkshire, MD 11/07/14 [phone]

## 2014-11-04 NOTE — Discharge Instructions (Signed)
Follow up with your md next week as planned °

## 2014-11-04 NOTE — ED Notes (Signed)
Patient reports had total right hip replacement last week. Reports is concerned about cellulitis vs infection to right lower leg. Reports saw MD yesterday and was taken off of Xarelto. Complaining of bruising to lower leg and increased pain and swelling.

## 2015-01-19 ENCOUNTER — Other Ambulatory Visit (HOSPITAL_COMMUNITY): Payer: Self-pay | Admitting: Family Medicine

## 2015-01-19 DIAGNOSIS — R52 Pain, unspecified: Secondary | ICD-10-CM

## 2015-02-01 ENCOUNTER — Ambulatory Visit (HOSPITAL_COMMUNITY)
Admission: RE | Admit: 2015-02-01 | Discharge: 2015-02-01 | Disposition: A | Discharge status: Home / Self Care | Payer: Medicare HMO | Source: Ambulatory Visit | Attending: Family Medicine | Admitting: Family Medicine

## 2015-02-01 DIAGNOSIS — R52 Pain, unspecified: Secondary | ICD-10-CM

## 2015-02-01 DIAGNOSIS — N644 Mastodynia: Secondary | ICD-10-CM | POA: Diagnosis present

## 2015-04-26 ENCOUNTER — Ambulatory Visit: Payer: Medicare HMO | Admitting: Cardiology

## 2015-05-16 ENCOUNTER — Ambulatory Visit (INDEPENDENT_AMBULATORY_CARE_PROVIDER_SITE_OTHER): Payer: Medicare HMO | Admitting: Cardiology

## 2015-05-16 ENCOUNTER — Encounter: Payer: Self-pay | Admitting: Cardiology

## 2015-05-16 VITALS — BP 127/70 | HR 78 | Ht 67.0 in | Wt 175.0 lb

## 2015-05-16 DIAGNOSIS — R0989 Other specified symptoms and signs involving the circulatory and respiratory systems: Secondary | ICD-10-CM

## 2015-05-16 DIAGNOSIS — I10 Essential (primary) hypertension: Secondary | ICD-10-CM | POA: Diagnosis not present

## 2015-05-16 DIAGNOSIS — R011 Cardiac murmur, unspecified: Secondary | ICD-10-CM | POA: Diagnosis not present

## 2015-05-16 NOTE — Patient Instructions (Signed)
Your physician has requested that you have an echocardiogram. Echocardiography is a painless test that uses sound waves to create images of your heart. It provides your doctor with information about the size and shape of your heart and how well your heart's chambers and valves are working. This procedure takes approximately one hour. There are no restrictions for this procedure. Your physician has requested that you have a carotid duplex. This test is an ultrasound of the carotid arteries in your neck. It looks at blood flow through these arteries that supply the brain with blood. Allow one hour for this exam. There are no restrictions or special instructions. Office will contact with results via phone or letter.   Continue all current medications. Follow up based on test results.

## 2015-05-16 NOTE — Progress Notes (Signed)
Patient ID: KYREN VAUX, female   DOB: June 12, 1945, 70 y.o.   MRN: [id number]     Clinical Summary Ms. Sebring is a 70 y.o.female seen today as a new patient for the following medical problems.   1. Heart murmur - patient with recently diagnosed heart murmur by her pcp - no SOB or DOE. Remains very active at home working in house and yard. No LE edema, no orthopnea. - no chest pain, no palpitations.    Past Medical History  Diagnosis Date  . Hypertension   . Chronic shoulder pain   . Chronic pelvic pain in female     pelvic joint and thigh  . Chronic back pain   . Chronic right hip pain   . Sciatica   . PONV (postoperative nausea and vomiting)   . Pneumonia     hx of  . 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 Frequency of urination   . Arthritis      Allergies  Allergen Reactions  . Clarithromycin Shortness Of Breath  . Codeine Shortness Of Breath  . Hydrocodone Shortness Of Breath  . Penicillins Shortness Of Breath  . Sulfa Antibiotics Shortness Of Breath  . Dilaudid [Hydromorphone Hcl] Other (See Comments)    Oral sores  . Oxycodone Hcl Nausea Only     Current Outpatient Prescriptions  Medication Sig Dispense Refill  . amLODipine (NORVASC) 10 MG tablet Take 10 mg by mouth daily.    Marland Kitchen HYDROmorphone (DILAUDID) 2 MG tablet Take 1 tablet (2 mg total) by mouth every 4 (four) hours as needed for severe pain (1 - 2 TABLETS Q 4H PRN PAIN). (Patient not taking: Reported on 11/04/2014) 90 tablet 0  . levofloxacin (LEVAQUIN) 500 MG tablet Take 1 tablet (500 mg total) by mouth daily. 7 tablet 0  . methocarbamol (ROBAXIN) 500 MG tablet Take 1 tablet (500 mg total) by mouth every 8 (eight) hours as needed for muscle spasms. 30 tablet 0  . ondansetron (ZOFRAN ODT) 8 MG disintegrating tablet  ODT q4 hours prn nausea (Patient not taking: Reported on 10/12/2014) 4 tablet 0  . ondansetron (ZOFRAN) 4 MG tablet Take 1 tablet (4 mg total) by mouth every 8 (eight) hours as needed for  nausea or vomiting. (Patient not taking: Reported on 11/04/2014) 6 tablet 0  . rivaroxaban (XARELTO) 10 MG TABS tablet Take 1 tablet (10 mg total) by mouth daily with breakfast. (Patient not taking: Reported on 11/04/2014) 30 tablet 0   No current facility-administered medications for this visit.     Past Surgical History  Procedure Laterality Date  . Lumbago    . Neuralgia neuritis    . Radiculitis    . Partial hysterectomy    . Abdominal hysterectomy    . Cholecystectomy    . Colonoscopy w/ polypectomy    . Back surgery    . 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 Clarithromycin Shortness Of Breath  . Codeine Shortness Of Breath  . Hydrocodone Shortness Of Breath  . Penicillins Shortness Of Breath  . Sulfa Antibiotics Shortness Of Breath  . Dilaudid [Hydromorphone Hcl] Other (See Comments)    Oral sores  . Oxycodone Hcl Nausea Only      Family History  Problem Relation Age of Onset  . Hypertension Mother   .  Hypertension Father      Social History Ms. Chesler reports that she has been smoking.  She does not have any smokeless tobacco history on file. Ms. Lamia reports that she does not drink alcohol.   Review of Systems CONSTITUTIONAL: No weight loss, fever, chills, weakness or fatigue.  HEENT: Eyes: No visual loss, blurred vision, double vision or yellow sclerae.No hearing loss, sneezing, congestion, runny nose or sore throat.  SKIN: No rash or itching.  CARDIOVASCULAR: per HPI RESPIRATORY: No shortness of breath, cough or sputum.  GASTROINTESTINAL: No anorexia, nausea, vomiting or diarrhea. No abdominal pain or blood.  GENITOURINARY: No burning on urination, no polyuria NEUROLOGICAL: No headache, dizziness, syncope, paralysis, ataxia, numbness or tingling in the extremities. No change in bowel or  bladder control.  MUSCULOSKELETAL: No muscle, back pain, joint pain or stiffness.  LYMPHATICS: No enlarged nodes. No history of splenectomy.  PSYCHIATRIC: No history of depression or anxiety.  ENDOCRINOLOGIC: No reports of sweating, cold or heat intolerance. No polyuria or polydipsia.  Marland Kitchen   Physical Examination Filed Vitals:   05/16/15 1024  BP: 127/70  Pulse: 78   Filed Vitals:   05/16/15 1024  Height:  (1.702 m)  Weight: 175 lb (79.379 kg)    Gen: resting comfortably, no acute distress HEENT: no scleral icterus, pupils equal round and reactive, no palptable cervical adenopathy,  CV: RRR, 2/6 systolic murmur RUSB, no jvd. Bilateral carotid bruits Resp: Clear to auscultation bilaterally GI: abdomen is soft, non-tender, non-distended, normal bowel sounds, no hepatosplenomegaly MSK: extremities are warm, no edema.  Skin: warm, no rash Neuro:  no focal deficits Psych: appropriate affect   Diagnostic Studies  10/2014 CT PE IMPRESSION: There is no demonstrable pulmonary embolus. There are areas of atelectatic change but no frank edema or consolidation. No adenopathy. There is hepatic steatosis. 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   Assessment and Plan  1. Heart murmur - recent diagnosis, no current symptoms - will obtain echo to better evaluate significance.    2. Carotid bruits - obtain carotid US  3. HTN - at goal, continue current meds  Antoine Poche, M.D.

## 2015-05-26 ENCOUNTER — Other Ambulatory Visit: Payer: Self-pay

## 2015-05-26 ENCOUNTER — Ambulatory Visit: Payer: Medicare HMO

## 2015-05-26 ENCOUNTER — Ambulatory Visit (INDEPENDENT_AMBULATORY_CARE_PROVIDER_SITE_OTHER): Payer: Medicare HMO

## 2015-05-26 DIAGNOSIS — R0989 Other specified symptoms and signs involving the circulatory and respiratory systems: Secondary | ICD-10-CM

## 2015-05-26 DIAGNOSIS — R011 Cardiac murmur, unspecified: Secondary | ICD-10-CM | POA: Diagnosis not present

## 2015-05-31 ENCOUNTER — Telehealth: Payer: Self-pay | Admitting: *Deleted

## 2015-05-31 NOTE — Telephone Encounter (Signed)
Pt aware, routed to pcp 

## 2015-05-31 NOTE — Telephone Encounter (Signed)
-----   Message from Antoine PocheJonathan F Branch, MD sent at 05/27/2015  1:15 PM EST ----- Mild blockaged on both sides seen on carotid US, we will continue to monitor  Dominga FerryJ Branch MD

## 2015-05-31 NOTE — Telephone Encounter (Signed)
-----   Message from Antoine PocheJonathan F Branch, MD sent at 05/27/2015  1:23 PM EST ----- Echo overall looks good. One of her heart valves is thicker than normal which creates a murmur, but its overall function is normal.   Dominga FerryJ Branch MD

## 2016-06-08 IMAGING — CR DG HIP (WITH OR WITHOUT PELVIS) 1V PORT*R*
1 series · 1 of 1 positions shown · non-contrast
Comparison: 04/28/2013

CLINICAL DATA: Postoperative state.

EXAM:
DG HIP (WITH OR WITHOUT PELVIS) 1V PORT RIGHT

[AP]
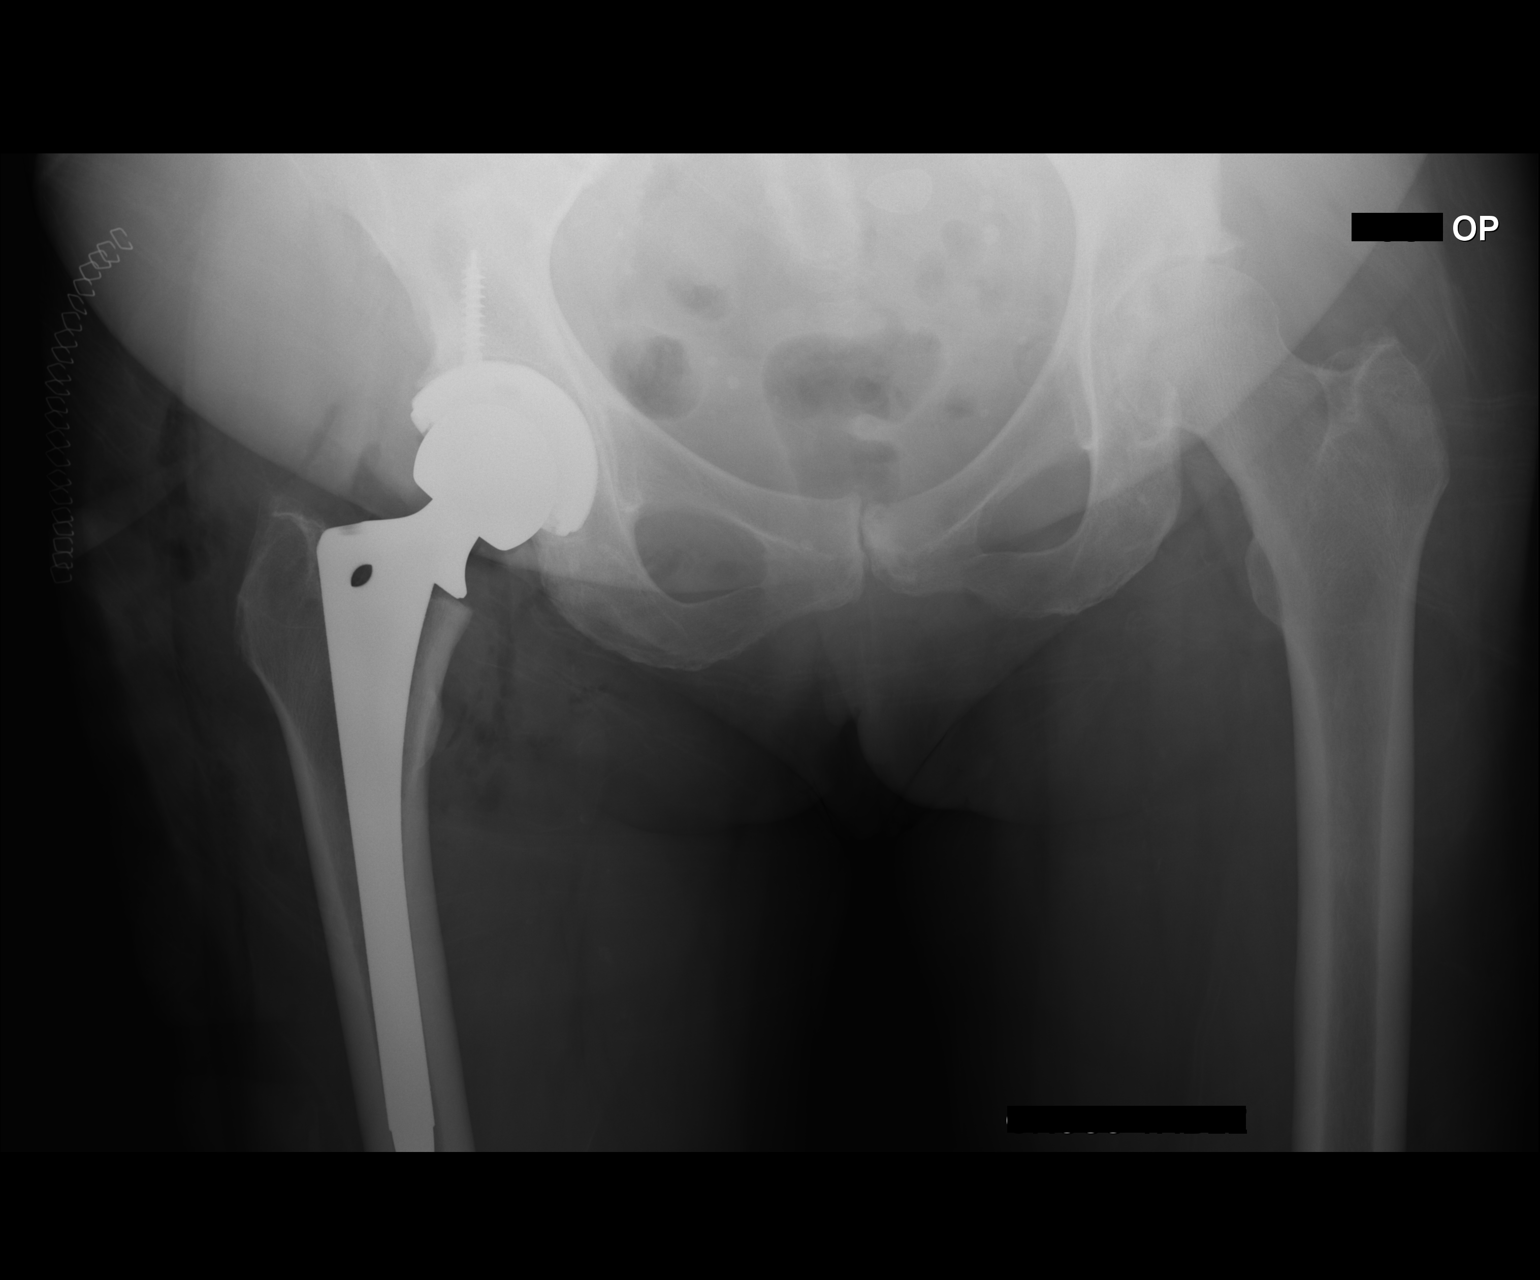

[1 of 1 positions shown; findings below may reference images not displayed]

FINDINGS: Patient has undergone right total hip arthroplasty. Surgical clips
overlie the lateral portion of the hip. There is postop gas in the
soft tissues. Prosthesis is normally located. No acute fractures.
Degenerative changes are seen in the left hip.
IMPRESSION: Status post right hip arthroplasty.  No adverse features.

## 2016-07-18 ENCOUNTER — Ambulatory Visit (INDEPENDENT_AMBULATORY_CARE_PROVIDER_SITE_OTHER): Payer: Medicare HMO | Admitting: Orthopaedic Surgery

## 2016-08-29 ENCOUNTER — Encounter (INDEPENDENT_AMBULATORY_CARE_PROVIDER_SITE_OTHER): Payer: Self-pay | Admitting: Orthopaedic Surgery

## 2016-08-29 ENCOUNTER — Ambulatory Visit (INDEPENDENT_AMBULATORY_CARE_PROVIDER_SITE_OTHER): Payer: Medicare HMO | Admitting: Orthopaedic Surgery

## 2016-08-29 ENCOUNTER — Ambulatory Visit (INDEPENDENT_AMBULATORY_CARE_PROVIDER_SITE_OTHER): Payer: Medicare HMO

## 2016-08-29 VITALS — Ht 66.0 in | Wt 165.0 lb

## 2016-08-29 DIAGNOSIS — M25531 Pain in right wrist: Secondary | ICD-10-CM

## 2016-08-29 MED ORDER — METHYLPREDNISOLONE ACETATE 40 MG/ML IJ SUSP
40.0000 mg | INTRAMUSCULAR | Status: AC | PRN
  Administered 2016-08-29: 40 mg

## 2016-08-29 MED ORDER — LIDOCAINE HCL (PF) 1 % IJ SOLN
1.0000 mL | INTRAMUSCULAR | Status: AC | PRN
  Administered 2016-08-29: 1 mL

## 2016-08-29 NOTE — Progress Notes (Signed)
Office Visit Note   Patient: Jessica Hoover           Date of Birth: 07-27-1945           MRN: [id number] Visit Date: 08/29/2016              Requested by: Philbert Riser, MD [address] [place], [place] [address] PCP: Philbert Riser, MD   Assessment & Plan: Visit Diagnoses:  1. Pain in right wrist   De Quervain's tenosynovitis  Plan: Cortisone injection first dorsal extensor compartment. De Quervain's splint. Mrs. Juste is in the midst of obtaining clearance for a left total hip replacement  Follow-Up Instructions: No Follow-up on file.   Orders:  Orders Placed This Encounter  Procedures  . XR Wrist Complete Right   No orders of the defined types were placed in this encounter.     Procedures: Hand/UE Inj Date/Time: 08/29/2016 2:37 PM Performed by: Valeria Batman Authorized by: Valeria Batman   Consent Given by:  Patient Indications:  Pain and tendon swelling Condition: de Quervain's   Site:  R extensor compartment 1 Needle Size:  27 G Approach:  Dorsal Medications:  1 mL lidocaine (PF) 1 %; 40 mg methylPREDNISolone acetate 40 MG/ML     Clinical Data: No additional findings.   Subjective: Chief Complaint  Patient presents with  . Right Hand - Pain, Weakness    Pt caught her hand in a mixer over 15 yrs ago and created a long finger (stuck) and he "took a blad and cut the tendon". Now she was washing and felt something pop in her hand at [facility] joint  . Left Hip - Pain    ALso wants to discuss Left hip surgery. Working on getting all her clearances prior to surgery  Presently having some swelling and pain along the first dorsal extensor compartment of her right wrist with difficulty gripping objects. We are in the midst of obtaining clearance for her left total hip replacement. She is 2 years status post successful right total hip replacement and has degenerative change by film of the left hip  HPI  Review of Systems   Objective: Vital  Signs: Ht 5\' 6"  (1.676 m)   Wt 165 lb (74.8 kg)   BMI 26.63 kg/m   Physical Exam  Ortho Exam right wrist with positive Finkelstein's test and local tenderness and swelling over the first dorsal extensor compartment. Skin intact. Neurovascular exam intact.  Specialty Comments:  No specialty comments available.  Imaging: Xr Wrist Complete Right  Result Date: 08/29/2016 Films of the right wrist obtained in 3 projections. There is some mild degenerative change at the base of the thumb at the metacarpal carpal joint. No ectopic calcification. No midcarpal abnormalities. Minimal negative ulnar variance. no evidence of fracture    PMFS History: Patient Active Problem List   Diagnosis Date Noted  . Primary osteoarthritis of right hip 10/28/2014  . Hypokalemia 10/28/2014  . Primary osteoarthritis of left hip 10/26/2014  . S/P total hip arthroplasty 10/26/2014   Past Medical History:  Diagnosis Date  . Arthritis   . Chronic back pain   . Chronic pelvic pain in female    pelvic joint and thigh  . Chronic right hip pain   . Chronic shoulder pain   . Frequency of urination   . Hypertension   . Pneumonia    hx of  . PONV (postoperative nausea and vomiting)   . Sciatica   .  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 Hypertension Mother   . Hypertension Father     Past Surgical History:  Procedure Laterality Date  . ABDOMINAL HYSTERECTOMY    . BACK SURGERY    . CHOLECYSTECTOMY    . COLONOSCOPY W/ POLYPECTOMY    . lumbago    . neuralgia neuritis    . PARTIAL HYSTERECTOMY    . radiculitis    . TOTAL HIP ARTHROPLASTY Right 10/26/2014   Procedure: TOTAL HIP ARTHROPLASTY;  Surgeon: Valeria BatmanPeter W Whitfield, MD;  [facility];  Service: Orthopedics;  Laterality: Right;   Social History   Occupational History  . Not on file.   Social History Main Topics  . Smoking status: Current Every Day Smoker    Packs/day: 0.25    Years:  53.00    Types: Cigarettes    Start date: 07/11/1965  . Smokeless tobacco: Never Used  . Alcohol use No  . Drug use: No  . Sexual activity: Not on file     Valeria BatmanPeter W Whitfield, MD   Note - This record has been created using [employer] software.  Chart creation errors have been sought, but may not always  have been located. Such creation errors do not reflect on  the standard of medical care.

## 2016-08-30 ENCOUNTER — Ambulatory Visit (INDEPENDENT_AMBULATORY_CARE_PROVIDER_SITE_OTHER): Payer: Medicare HMO | Admitting: Cardiology

## 2016-08-30 ENCOUNTER — Encounter: Payer: Self-pay | Admitting: Cardiology

## 2016-08-30 ENCOUNTER — Other Ambulatory Visit (INDEPENDENT_AMBULATORY_CARE_PROVIDER_SITE_OTHER): Payer: Self-pay

## 2016-08-30 VITALS — BP 131/78 | HR 79 | Ht 67.0 in | Wt 177.0 lb

## 2016-08-30 DIAGNOSIS — Z01810 Encounter for preprocedural cardiovascular examination: Secondary | ICD-10-CM | POA: Diagnosis not present

## 2016-08-30 DIAGNOSIS — I10 Essential (primary) hypertension: Secondary | ICD-10-CM | POA: Insufficient documentation

## 2016-08-30 NOTE — Progress Notes (Addendum)
Clinical Summary [name] is a 71 y.o.female seen for follow up of the following medical problems  1. Preoperative evaluation - being considered for hip replacement. Prior replacement in 2016 - no recent chest pain or SOB - walks up basement steps 2-3 times a day without troubles.     Past Medical History:  Diagnosis Date  . Arthritis   . Chronic back pain   . Chronic pelvic pain in female    pelvic joint and thigh  . Chronic right hip pain   . Chronic shoulder pain   . Frequency of urination   . Hypertension   . Pneumonia    hx of  . PONV (postoperative nausea and vomiting)   . Sciatica   . 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 Clarithromycin Shortness Of Breath  . Codeine Shortness Of Breath  . Hydrocodone Shortness Of Breath  . Penicillins Shortness Of Breath  . Sulfa Antibiotics Shortness Of Breath  . Dilaudid [Hydromorphone Hcl] Other (See Comments)    Oral sores  . Oxycodone Hcl Nausea Only     Current Outpatient Prescriptions  Medication Sig Dispense Refill  . amLODipine (NORVASC) 5 MG tablet Take 5 mg by mouth daily.     No current facility-administered medications for this visit.      Past Surgical History:  Procedure Laterality Date  . ABDOMINAL HYSTERECTOMY    . BACK SURGERY    . CHOLECYSTECTOMY    . COLONOSCOPY W/ POLYPECTOMY    . lumbago    . neuralgia neuritis    . PARTIAL HYSTERECTOMY    . radiculitis    . TOTAL HIP ARTHROPLASTY Right 10/26/2014   Procedure: TOTAL HIP ARTHROPLASTY;  Surgeon: Valeria BatmanPeter W Whitfield, MD;  [facility];  Service: Orthopedics;  Laterality: Right;     Allergies  Allergen Reactions  . Clarithromycin Shortness Of Breath  . Codeine Shortness Of Breath  . Hydrocodone Shortness Of Breath  . Penicillins Shortness Of Breath  . Sulfa Antibiotics Shortness Of Breath  . Dilaudid [Hydromorphone Hcl] Other (See Comments)    Oral sores  . Oxycodone Hcl Nausea Only       Family History  Problem Relation Age of Onset  . Hypertension Mother   . Hypertension Father      Social History [name] reports that she has been smoking Cigarettes.  She started smoking about 51 years ago. She has a 13.25 pack-year smoking history. She has never used smokeless tobacco. [name] reports that she does not drink alcohol.   Review of Systems CONSTITUTIONAL: No weight loss, fever, chills, weakness or fatigue.  HEENT: Eyes: No visual loss, blurred vision, double vision or yellow sclerae.No hearing loss, sneezing, congestion, runny nose or sore throat.  SKIN: No rash or itching.  CARDIOVASCULAR: per hpi RESPIRATORY: No shortness of breath, cough or sputum.  GASTROINTESTINAL: No anorexia, nausea, vomiting or diarrhea. No abdominal pain or blood.  GENITOURINARY: No burning on urination, no polyuria NEUROLOGICAL: No headache, dizziness, syncope, paralysis, ataxia, numbness or tingling in the extremities. No change in bowel or bladder control.  MUSCULOSKELETAL: No muscle, back pain, joint pain or stiffness.  LYMPHATICS: No enlarged nodes. No history of splenectomy.  PSYCHIATRIC: No history of depression or anxiety.  ENDOCRINOLOGIC: No reports of sweating, cold or heat intolerance. No polyuria or polydipsia.  Marland Kitchen.   Physical Examination Vitals:   08/30/16 1041  BP: 131/78  Pulse: 79   Vitals:   08/30/16 1041  Weight: 177 lb (80.3 kg)  Height: 5\' 7"  (1.702 m)    Gen: resting comfortably, no acute distress HEENT: no scleral icterus, pupils equal round and reactive, no palptable cervical adenopathy,  CV: RRR, 2/6 systolic murmur rusb, no jvd Resp: Clear to auscultation bilaterally GI: abdomen is soft, non-tender, non-distended, normal bowel sounds, no hepatosplenomegaly MSK: extremities are warm, no edema.  Skin: warm, no rash Neuro:  no focal deficits Psych: appropriate affect   Diagnostic Studies 10/2014 CT PE IMPRESSION: There is no demonstrable  pulmonary embolus. There are areas of atelectatic change but no frank edema or consolidation. No adenopathy. There is hepatic steatosis. 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  05/2015 echo Study Conclusions  - Left ventricle: The cavity size was normal. Wall thickness was   increased in a pattern of moderate LVH. Systolic function was   normal. The estimated ejection fraction was in the range of 60%   to 65%. Wall motion was normal; there were no regional wall   motion abnormalities. Doppler parameters are consistent with   abnormal left ventricular relaxation (grade 1 diastolic   dysfunction). - Aortic valve: Mildly thickened, moderately calcified leaflets.   Aortic valve sclerosis noted. Morphologically, there appears to   be at least mild stenosis. No hemodynamic parameters obtained. - Mitral valve: Mildly calcified annulus.  Assessment and Plan   1. Preoperative evaluation - patient being considered for hip replacement - no active cardiac conditions. Tolerates greater than 4 METs without troubles. EKG in clinic SR without acute ischemic changes - recommend proceeding with surgery as planned     Antoine Poche, M.D.

## 2016-08-30 NOTE — Patient Instructions (Signed)
Your physician wants you to follow-up [facility]: [facility] You will receive a reminder letter in the mail two months in advance. If you don't receive a letter, please call our office to schedule the follow-up appointment.  Your physician recommends that you continue on your current medications as directed. Please refer to the Current Medication list given to you today.  WE HAVE FAXED YOUR SURGICAL CLEARANCE   Thank you for choosing [facility]!!

## 2016-09-04 ENCOUNTER — Encounter (INDEPENDENT_AMBULATORY_CARE_PROVIDER_SITE_OTHER): Payer: Self-pay | Admitting: Orthopaedic Surgery

## 2016-09-19 ENCOUNTER — Ambulatory Visit (INDEPENDENT_AMBULATORY_CARE_PROVIDER_SITE_OTHER): Payer: Medicare HMO

## 2016-09-19 ENCOUNTER — Ambulatory Visit (INDEPENDENT_AMBULATORY_CARE_PROVIDER_SITE_OTHER): Payer: Medicare HMO | Admitting: Orthopaedic Surgery

## 2016-09-19 ENCOUNTER — Encounter (INDEPENDENT_AMBULATORY_CARE_PROVIDER_SITE_OTHER): Payer: Self-pay | Admitting: Orthopedic Surgery

## 2016-09-19 ENCOUNTER — Ambulatory Visit (INDEPENDENT_AMBULATORY_CARE_PROVIDER_SITE_OTHER): Payer: Medicare HMO | Admitting: Orthopedic Surgery

## 2016-09-19 VITALS — BP 148/83 | HR 65 | Ht 66.0 in | Wt 174.0 lb

## 2016-09-19 DIAGNOSIS — M1612 Unilateral primary osteoarthritis, left hip: Secondary | ICD-10-CM | POA: Diagnosis not present

## 2016-09-19 DIAGNOSIS — M25552 Pain in left hip: Secondary | ICD-10-CM

## 2016-09-19 NOTE — H&P (Signed)
Jessica Campbell, MD   [name], PA-C [address], [place], [place]  [address]                             [phone]   MICHAEL VENTRESCA MRN:  [id number] DOB/SEX:  01/01/1946/female  ORTHOPAEDIC HISTORY & PHYSICAL  CHIEF COMPLAINT:  Painful left Hip  HISTORY: Jessica Pickney Enochis a 71 y.o. female  Who has a history of pain and functional disability in the left hip(s) due to arthritis and patient has failed non-surgical conservative treatments for greater than 12 weeks to include corticosteriod injections, flexibility and strengthening excercises, use of assistive devices and activity modification.  Onset of symptoms was gradual starting 2 years ago with rapidlly worsening course since that time.The patient noted no past surgery on the left hip(s).  Patient currently rates pain in the left hip at 7 out of 10 with activity. Patient has night pain, worsening of pain with activity and weight bearing, trendelenberg gait, pain that interfers with activities of daily living and pain with passive range of motion. Patient has evidence of subchondral cysts, subchondral sclerosis, periarticular osteophytes and joint space narrowing by imaging studies. This condition presents safety issues increasing the risk of falls. This patient has had avascular necrosis of the hip, acetabular fracture, hip dysplasia.  There is no current active infection.  PAST MEDICAL HISTORY: Patient Active Problem List   Diagnosis Date Noted  . Essential hypertension 08/30/2016  . Primary osteoarthritis of right hip 10/28/2014  . Hypokalemia 10/28/2014  . Primary osteoarthritis of left hip 10/26/2014  . S/P total hip arthroplasty 10/26/2014   Past Medical History:  Diagnosis Date  . Arthritis   . Chronic back pain   . Chronic pelvic pain in female    pelvic joint and thigh  . Chronic right hip pain   . Chronic shoulder pain   . Frequency of urination   . Hypertension   . Osteoarthritis   . Pneumonia    hx of  . PONV  (postoperative nausea and vomiting)   . Sciatica   . Seizures (HCC)    had seizures as a child   Past Surgical History:  Procedure Laterality Date  . ABDOMINAL HYSTERECTOMY    . BACK SURGERY    . CHOLECYSTECTOMY    . COLONOSCOPY W/ POLYPECTOMY    . lumbago    . neuralgia neuritis    . PARTIAL HYSTERECTOMY    . radiculitis    . TOTAL HIP ARTHROPLASTY Right 10/26/2014   Procedure: TOTAL HIP ARTHROPLASTY;  Surgeon: Valeria Batman, MD;  Location: [facility] OR;  Service: Orthopedics;  Laterality: Right;     MEDICATIONS PRIOR TO ADMISSION:  Current Outpatient Prescriptions:  .  amLODipine (NORVASC) 10 MG tablet, Take 10 mg by mouth daily., Disp: , Rfl:    ALLERGIES:   Allergies  Allergen Reactions  . Clarithromycin Shortness Of Breath  . Codeine Shortness Of Breath  . Hydrocodone Shortness Of Breath  . Penicillins Shortness Of Breath  . Sulfa Antibiotics Shortness Of Breath  . Dilaudid [Hydromorphone Hcl] Other (See Comments)    Oral sores  . Oxycodone Hcl Nausea Only    REVIEW OF SYSTEMS:  Review of Systems  Cardiovascular:       History of hypertension and heart murmur  All other systems reviewed and are negative.   FAMILY HISTORY:   Family History  Problem Relation Age of Onset  . Hypertension Mother   .  Depression Mother   . Hypertension Father   . Cancer Father     SOCIAL HISTORY:   Social History   Occupational History  . Not on file.   Social History Main Topics  . Smoking status: Current Every Day Smoker    Packs/day: 0.25    Years: 53.00    Types: Cigarettes    Start date: 07/11/1965  . Smokeless tobacco: Never Used  . Alcohol use No  . Drug use: No  . Sexual activity: Not Currently     EXAMINATION:  Vital signs in last 24 hours: BP (!) 148/83 (BP Location: Left Arm, Patient Position: Sitting, Cuff Size: Normal)   Pulse 65   Ht 5\' 6"  (1.676 m)   Wt 174 lb (78.9 kg)   BMI 28.08 kg/m   Physical Exam  Constitutional: She is oriented to  person, place, and time. She appears well-developed and well-nourished.  HENT:  Head: Normocephalic and atraumatic.  Eyes: Conjunctivae and EOM are normal. Pupils are equal, round, and reactive to light.  Neck: Neck supple.  Cardiovascular: Normal rate and regular rhythm.   Murmur heard. Pulmonary/Chest: Effort normal and breath sounds normal.  Abdominal: Soft. Bowel sounds are normal. There is no tenderness.  Neurological: She is alert and oriented to person, place, and time.  Skin: Skin is warm and dry.  Psychiatric: She has a normal mood and affect. Her behavior is normal. Judgment and thought content normal.   Ortho Exam  She has limited range of motion of the left hip. She tends to lie with the leg externally rotated more than the right. I didn't get her to about 100 of flexion. She has very limited motion at the 100 of flexion internally and externally with rotation.  Imaging Review  Xr Hip Unilat W Or W/o Pelvis 2-3 Views Left  Result Date: 09/19/2016 X-rays of the pelvis and left hip reveals bone-on-bone superior dome with irregularity of the surfaces of both the acetabulum and the femoral head.  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 Marked chondral cystic changes are noted on both also. Periarticular spurs are noted.   Plain radiographs demonstrate moderate degenerative joint disease of the left hip. The bone quality appears to be good for age and reported activity level.  Assessment: End stage arthritis, left Hip  Past Medical History:  Diagnosis Date  . Arthritis   . Chronic back pain   . Chronic pelvic pain in female    pelvic joint and thigh  . Chronic right hip pain   . Chronic shoulder pain   . Frequency of urination   . Hypertension   . Osteoarthritis   . Pneumonia    hx of  . PONV (postoperative nausea and vomiting)   . Sciatica   . Seizures (HCC)    had seizures as a child    Plan: for left total hip replacement.  The patient  history, physical examination, clinical judgement of the provider and imaging studies are consistent with end stage degenerative joint disease of the left hip(s) and total hip arthroplasty is deemed medically necessary. The treatment options including medical management, injection therapy, arthroscopy and arthroplasty were discussed at length. The risks and benefits of total hip arthroplasty were presented and reviewed. The risks due to aseptic loosening, infection, stiffness, dislocation/subluxation,  thromboembolic complications and other imponderables were discussed.  The patient acknowledged the explanation, agreed to proceed with the plan. The clearance notes recently received were reviewed and concurs with proceeding  then surgical intervention.  Patient is being admitted for inpatient treatment for surgery, pain control, PT, OT, prophylactic antibiotics, VTE prophylaxis, progressive ambulation and ADL's and discharge planning.The patient is planning to be discharged home with home health services   Oris Drone. Aleda Grana [facility] [phone]  09/19/2016 10:08 AM

## 2016-09-19 NOTE — Progress Notes (Signed)
[name], MD   [name], PA-C [address], [place], [place]  [address]                             [phone]   Jessica Hoover MRN:  [id number] DOB/SEX:  01/21/1946/female  ORTHOPAEDIC HISTORY & PHYSICAL  CHIEF COMPLAINT:  Painful left Hip  HISTORY: Jessica Hoover a 71 y.o. female  Who has a history of pain and functional disability in the left hip(s) due to arthritis and patient has failed non-surgical conservative treatments for greater than 12 weeks to include corticosteriod injections, flexibility and strengthening excercises, use of assistive devices and activity modification.  Onset of symptoms was gradual starting 2 years ago with rapidlly worsening course since that time.The patient noted no past surgery on the left hip(s).  Patient currently rates pain in the left hip at 7 out of 10 with activity. Patient has night pain, worsening of pain with activity and weight bearing, trendelenberg gait, pain that interfers with activities of daily living and pain with passive range of motion. Patient has evidence of subchondral cysts, subchondral sclerosis, periarticular osteophytes and joint space narrowing by imaging studies. This condition presents safety issues increasing the risk of falls. This patient has had avascular necrosis of the hip, acetabular fracture, hip dysplasia.  There is no current active infection.  PAST MEDICAL HISTORY: Patient Active Problem List   Diagnosis Date Noted  . Essential hypertension 08/30/2016  . Primary osteoarthritis of right hip 10/28/2014  . Hypokalemia 10/28/2014  . Primary osteoarthritis of left hip 10/26/2014  . S/P total hip arthroplasty 10/26/2014   Past Medical History:  Diagnosis Date  . Arthritis   . Chronic back pain   . Chronic pelvic pain in female    pelvic joint and thigh  . Chronic right hip pain   . Chronic shoulder pain   . Frequency of urination   . Hypertension   . Osteoarthritis   . Pneumonia    hx of  . PONV  (postoperative nausea and vomiting)   . Sciatica   . Seizures (HCC)    had seizures as a child   Past Surgical History:  Procedure Laterality Date  . ABDOMINAL HYSTERECTOMY    . BACK SURGERY    . CHOLECYSTECTOMY    . COLONOSCOPY W/ POLYPECTOMY    . lumbago    . neuralgia neuritis    . PARTIAL HYSTERECTOMY    . radiculitis    . TOTAL HIP ARTHROPLASTY Right 10/26/2014   Procedure: TOTAL HIP ARTHROPLASTY;  Surgeon: Peter W Whitfield, MD;  Location: [facility] OR;  Service: Orthopedics;  Laterality: Right;     MEDICATIONS PRIOR TO ADMISSION:  Current Outpatient Prescriptions:  .  amLODipine (NORVASC) 10 MG tablet, Take 10 mg by mouth daily., Disp: , Rfl:    ALLERGIES:   Allergies  Allergen Reactions  . Clarithromycin Shortness Of Breath  . Codeine Shortness Of Breath  . Hydrocodone Shortness Of Breath  . Penicillins Shortness Of Breath  . Sulfa Antibiotics Shortness Of Breath  . Dilaudid [Hydromorphone Hcl] Other (See Comments)    Oral sores  . Oxycodone Hcl Nausea Only    REVIEW OF SYSTEMS:  Review of Systems  Cardiovascular:       History of hypertension and heart murmur  All other systems reviewed and are negative.   FAMILY HISTORY:   Family History  Problem Relation Age of Onset  . Hypertension Mother   .   Depression Mother   . Hypertension Father   . Cancer Father     SOCIAL HISTORY:   Social History   Occupational History  . Not on file.   Social History Main Topics  . Smoking status: Current Every Day Smoker    Packs/day: 0.25    Years: 53.00    Types: Cigarettes    Start date: 07/11/1965  . Smokeless tobacco: Never Used  . Alcohol use No  . Drug use: No  . Sexual activity: Not Currently     EXAMINATION:  Vital signs in last 24 hours: BP (!) 148/83 (BP Location: Left Arm, Patient Position: Sitting, Cuff Size: Normal)   Pulse 65   Ht 5' 6" (1.676 m)   Wt 174 lb (78.9 kg)   BMI 28.08 kg/m   Physical Exam  Constitutional: She is oriented to  person, place, and time. She appears well-developed and well-nourished.  HENT:  Head: Normocephalic and atraumatic.  Eyes: Conjunctivae and EOM are normal. Pupils are equal, round, and reactive to light.  Neck: Neck supple.  Cardiovascular: Normal rate and regular rhythm.   Murmur heard. Pulmonary/Chest: Effort normal and breath sounds normal.  Abdominal: Soft. Bowel sounds are normal. There is no tenderness.  Neurological: She is alert and oriented to person, place, and time.  Skin: Skin is warm and dry.  Psychiatric: She has a normal mood and affect. Her behavior is normal. Judgment and thought content normal.   Ortho Exam  She has limited range of motion of the left hip. She tends to lie with the leg externally rotated more than the right. I didn't get her to about 100 of flexion. She has very limited motion at the 100 of flexion internally and externally with rotation.  Imaging Review  Xr Hip Unilat W Or W/o Pelvis 2-3 Views Left  Result Date: 09/19/2016 X-rays of the pelvis and left hip reveals bone-on-bone superior dome with irregularity of the surfaces of both the acetabulum and the femoral head.  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 Marked chondral cystic changes are noted on both also. Periarticular spurs are noted.   Plain radiographs demonstrate moderate degenerative joint disease of the left hip. The bone quality appears to be good for age and reported activity level.  Assessment: End stage arthritis, left Hip  Past Medical History:  Diagnosis Date  . Arthritis   . Chronic back pain   . Chronic pelvic pain in female    pelvic joint and thigh  . Chronic right hip pain   . Chronic shoulder pain   . Frequency of urination   . Hypertension   . Osteoarthritis   . Pneumonia    hx of  . PONV (postoperative nausea and vomiting)   . Sciatica   . Seizures (HCC)    had seizures as a child    Plan: for left total hip replacement.  The patient  history, physical examination, clinical judgement of the provider and imaging studies are consistent with end stage degenerative joint disease of the left hip(s) and total hip arthroplasty is deemed medically necessary. The treatment options including medical management, injection therapy, arthroscopy and arthroplasty were discussed at length. The risks and benefits of total hip arthroplasty were presented and reviewed. The risks due to aseptic loosening, infection, stiffness, dislocation/subluxation,  thromboembolic complications and other imponderables were discussed.  The patient acknowledged the explanation, agreed to proceed with the plan. The clearance notes recently received were reviewed and concurs with proceeding   then surgical intervention.  Patient is being admitted for inpatient treatment for surgery, pain control, PT, OT, prophylactic antibiotics, VTE prophylaxis, progressive ambulation and ADL's and discharge planning.The patient is planning to be discharged home with home health services   Luane Rochon D. Briseidy Spark, PA-C [facility] [phone]  09/19/2016 10:08 AM    

## 2016-09-27 NOTE — Pre-Procedure Instructions (Signed)
Jessica Hoover  09/27/2016      [facility] [place], [place] - [address] [place] [address] Phone: [phone] Fax: [phone]    Your procedure is scheduled on Tues. July 17  [facility] at 8:10 A.M.  Call this number if you have problems the morning of surgery:  [phone]   Remember:  Do not eat food or drink liquids after midnight on Mon. July 16  Take these medicines the morning of surgery with A SIP OF WATER : amlodipine (norvasc)              1 week prior to surgery Stop: advil, motrin, ibuprofen, BC Powders, Goody's, vitamins and herbal medicines.   Do not wear jewelry, make-up or nail polish.  Do not wear lotions, powders, or perfumes, or deoderant.  Do not shave 48 hours prior to surgery.  Men may shave face and neck.  Do not bring valuables to the hospital.  [facility] is not responsible for any belongings or valuables.  Contacts, dentures or bridgework may not be worn into surgery.  Leave your suitcase in the car.  After surgery it may be brought to your room.  For patients admitted to the hospital, discharge time will be determined by your treatment team.  Patients discharged the day of surgery will not be allowed to drive home.    Special instructions:   [facility]- Preparing For Surgery  Before surgery, you can play an important role. Because skin is not sterile, your skin needs to be as free of germs as possible. You can reduce the number of germs on your skin by washing with CHG (chlorahexidine gluconate) Soap before surgery.  CHG is an antiseptic cleaner which kills germs and bonds with the skin to continue killing germs even after washing.  Please do not use if you have an allergy to CHG or antibacterial soaps. If your skin becomes reddened/irritated stop using the CHG.  Do not shave (including legs and underarms) for at least 48 hours prior to first CHG shower. It is OK to  shave your face.  Please follow these instructions carefully.   1. Shower the NIGHT BEFORE SURGERY and the MORNING OF SURGERY with CHG.   2. If you chose to wash your hair, wash your hair first as usual with your normal shampoo.  3. After you shampoo, rinse your hair and body thoroughly to remove the shampoo.  4. Use CHG as you would any other liquid soap. You can apply CHG directly to the skin and wash gently with a scrungie or a clean washcloth.   5. Apply the CHG Soap to your body ONLY FROM THE NECK DOWN.  Do not use on open wounds or open sores. Avoid contact with your eyes, ears, mouth and genitals (private parts). Wash genitals (private parts) with your normal soap.  6. Wash thoroughly, paying special attention to the area where your surgery will be performed.  7. Thoroughly rinse your body with warm water from the neck down.  8. DO NOT shower/wash with your normal soap after using and rinsing off the CHG Soap.  9. Pat yourself dry with a CLEAN TOWEL.   10. Wear CLEAN PAJAMAS   11. Place CLEAN SHEETS on your bed the night of your first shower and DO NOT SLEEP WITH PETS.    Day of Surgery: Do not apply any deodorants/lotions. Please wear clean clothes to the hospital/surgery  center.      Please read over the following fact sheets that you were given. Coughing and Deep Breathing, MRSA Information and Surgical Site Infection Prevention

## 2016-09-28 ENCOUNTER — Ambulatory Visit (HOSPITAL_COMMUNITY)
Admission: RE | Admit: 2016-09-28 | Discharge: 2016-09-28 | Disposition: A | Discharge status: Home / Self Care | Payer: Medicare HMO | Source: Ambulatory Visit | Attending: Orthopedic Surgery | Admitting: Orthopedic Surgery

## 2016-09-28 ENCOUNTER — Encounter (HOSPITAL_COMMUNITY)
Admission: RE | Admit: 2016-09-28 | Discharge: 2016-09-28 | Disposition: A | Discharge status: Home / Self Care | Payer: Medicare HMO | Source: Ambulatory Visit | Attending: Orthopaedic Surgery | Admitting: Orthopaedic Surgery

## 2016-09-28 ENCOUNTER — Encounter (HOSPITAL_COMMUNITY): Payer: Self-pay

## 2016-09-28 ENCOUNTER — Telehealth (INDEPENDENT_AMBULATORY_CARE_PROVIDER_SITE_OTHER): Payer: Self-pay

## 2016-09-28 DIAGNOSIS — Z01818 Encounter for other preprocedural examination: Secondary | ICD-10-CM | POA: Insufficient documentation

## 2016-09-28 DIAGNOSIS — I700 Atherosclerosis of aorta: Secondary | ICD-10-CM | POA: Diagnosis not present

## 2016-09-28 DIAGNOSIS — M1612 Unilateral primary osteoarthritis, left hip: Secondary | ICD-10-CM | POA: Insufficient documentation

## 2016-09-28 DIAGNOSIS — Z01812 Encounter for preprocedural laboratory examination: Secondary | ICD-10-CM | POA: Diagnosis present

## 2016-09-28 HISTORY — DX: Cardiac murmur, unspecified: R01.1

## 2016-09-28 LAB — CBC WITH DIFFERENTIAL/PLATELET
BASOS ABS: 0 10*3/uL (ref 0.0–0.1)
BASOS PCT: 0 %
EOS ABS: 0.2 10*3/uL (ref 0.0–0.7)
EOS PCT: 3 %
HCT: 41.5 % (ref 36.0–46.0)
Hemoglobin: 13.4 g/dL (ref 12.0–15.0)
LYMPHS PCT: 41 %
Lymphs Abs: 3 10*3/uL (ref 0.7–4.0)
MCH: 28 pg (ref 26.0–34.0)
MCHC: 32.3 g/dL (ref 30.0–36.0)
MCV: 86.6 fL (ref 78.0–100.0)
Monocytes Absolute: 0.3 10*3/uL (ref 0.1–1.0)
Monocytes Relative: 4 %
Neutro Abs: 3.7 10*3/uL (ref 1.7–7.7)
Neutrophils Relative %: 52 %
PLATELETS: 304 10*3/uL (ref 150–400)
RBC: 4.79 MIL/uL (ref 3.87–5.11)
RDW: 13.5 % (ref 11.5–15.5)
WBC: 7.3 10*3/uL (ref 4.0–10.5)

## 2016-09-28 LAB — TYPE AND SCREEN
ABO/RH(D): O POS
ANTIBODY SCREEN: NEGATIVE

## 2016-09-28 LAB — APTT: APTT: 27 s (ref 24–36)

## 2016-09-28 LAB — URINALYSIS, ROUTINE W REFLEX MICROSCOPIC
BILIRUBIN URINE: NEGATIVE
GLUCOSE, UA: NEGATIVE mg/dL
Hgb urine dipstick: NEGATIVE
KETONES UR: NEGATIVE mg/dL
Leukocytes, UA: NEGATIVE
NITRITE: NEGATIVE
PH: 6 (ref 5.0–8.0)
PROTEIN: NEGATIVE mg/dL
Specific Gravity, Urine: 1.005 (ref 1.005–1.030)

## 2016-09-28 LAB — COMPREHENSIVE METABOLIC PANEL
ALBUMIN: 4.5 g/dL (ref 3.5–5.0)
ALT: 17 U/L (ref 14–54)
AST: 16 U/L (ref 15–41)
Alkaline Phosphatase: 115 U/L (ref 38–126)
Anion gap: 9 (ref 5–15)
BUN: 11 mg/dL (ref 6–20)
CHLORIDE: 106 mmol/L (ref 101–111)
CO2: 25 mmol/L (ref 22–32)
CREATININE: 1 mg/dL (ref 0.44–1.00)
Calcium: 10.1 mg/dL (ref 8.9–10.3)
GFR calc Af Amer: >60 mL/min (ref >60)
GFR calc non Af Amer: 55 mL/min — ABNORMAL LOW (ref >60)
Glucose, Bld: 93 mg/dL (ref 65–99)
POTASSIUM: 3.4 mmol/L — AB (ref 3.5–5.1)
SODIUM: 140 mmol/L (ref 135–145)
Total Bilirubin: 0.6 mg/dL (ref 0.3–1.2)
Total Protein: 7.6 g/dL (ref 6.5–8.1)

## 2016-09-28 LAB — PROTIME-INR
INR: 0.91
Prothrombin Time: 12.3 seconds (ref 11.4–15.2)

## 2016-09-28 LAB — SURGICAL PCR SCREEN
MRSA, PCR: NEGATIVE
STAPHYLOCOCCUS AUREUS: NEGATIVE

## 2016-09-28 NOTE — Progress Notes (Signed)
PCP:Dr. Levonne SpillerSteven Kikel @ [facility] in [place],  Cardiologist: Dr. Dina RichJonathan  Branch-- clearance in epic under " notes"

## 2016-09-28 NOTE — Telephone Encounter (Signed)
received

## 2016-09-28 NOTE — Telephone Encounter (Signed)
Call report results for chest x ray- Near complete effacement of the disc space and hyperdense appearance of the adjacent vertebral bodies in the lower thoracic spine, possibly T8/T9. This may represent asymmetric osteoarthritic changes, however discitis/ osteomyelitis cannot be excluded. Alternatively the hyperdense appearance may be due to overlying perispinal mass. Further evaluation with chest CT should be considered.

## 2016-09-28 NOTE — Progress Notes (Addendum)
Anesthesia Chart Review:  Pt is a 71 year old female scheduled for L total hip arthroplasty on 10/09/2016 with Norlene CampbellPeter Whitfield, MD  - PCP is Smith RobertStephen Kikel, MD - Cardiologist is Dina RichJonathan Branch, MD, who cleared pt for surgery at last office visit 08/30/16.   PMH includes:  Mild aortic stenosis, HTN, post-op N/V. Current smoker. BMI 28. S/p R THA 10/26/14.  Medication: Amlodipine  Preoperative labs reviewed.    CXR 09/28/16:  1. No active cardiopulmonary disease. 2. Calcific atherosclerotic disease of the aorta. 3. Near complete effacement of the disc space and hyperdense appearance of the adjacent vertebral bodies in the lower thoracic spine, possibly T8/T9. This may represent asymmetric osteoarthritic changes, however discitis/ osteomyelitis cannot be excluded. Alternatively the hyperdense appearance may be due to overlying perispinal mass. Further evaluation with chest CT should be considered. - Autumn in Dr. Hoy RegisterWhitfield's office notified of CXR results.   EKG 08/30/16: Sinus  Rhythm. Diffuse nonspecific T-abnormality.   Echo 05/26/15:  - Left ventricle: The cavity size was normal. Wall thickness was increased in a pattern of moderate LVH. Systolic function was normal. The estimated ejection fraction was in the range of 60% to 65%. Wall motion was normal; there were no regional wall motion abnormalities. Doppler parameters are consistent with abnormal left ventricular relaxation (grade 1 diastolic dysfunction). - Aortic valve: Mildly thickened, moderately calcified leaflets. Aortic valve sclerosis noted. Morphologically, there appears to be at least mild stenosis. No hemodynamic parameters obtained. - Mitral valve: Mildly calcified annulus.  If no changes, I anticipate pt can proceed with surgery as scheduled.   Rica Mastngela Deziyah Arvin, FNP-BC [facility]esthesiology Phone: [phone] 09/28/2016 4:09 PM   Addendum:   CT chest 10/04/16:  1. The plain film abnormality was secondary  to degenerative disc disease at T8-9. 2.  No acute process in the chest. 3. Coronary artery atherosclerosis. Aortic Atherosclerosis (ICD10-I70.0). 4.  Emphysema (ICD10-J43.9). 5. Hepatic steatosis. 6. Stable pulmonary nodules since 10/31/2014. These can be presumed benign. 7. Probable sebaceous cyst about the right axilla. Enlarged since 2016. Consider physical exam correlation.  Pt had chest CT to further evaluate CXR findings that identified no concerning issues.  If no changes, I anticipate pt can proceed with surgery as scheduled.   Rica Mastngela Armya Westerhoff, FNP-BC [facility]iology Phone: [phone] 10/05/2016 1:45 PM

## 2016-09-29 LAB — URINE CULTURE: SPECIAL REQUESTS: NORMAL

## 2016-10-01 NOTE — Telephone Encounter (Signed)
Brian to call  

## 2016-10-01 NOTE — Telephone Encounter (Signed)
Please review

## 2016-10-02 ENCOUNTER — Other Ambulatory Visit (INDEPENDENT_AMBULATORY_CARE_PROVIDER_SITE_OTHER): Payer: Self-pay | Admitting: Orthopedic Surgery

## 2016-10-02 DIAGNOSIS — R9389 Abnormal findings on diagnostic imaging of other specified body structures: Secondary | ICD-10-CM

## 2016-10-04 ENCOUNTER — Ambulatory Visit (HOSPITAL_COMMUNITY)
Admission: RE | Admit: 2016-10-04 | Discharge: 2016-10-04 | Disposition: A | Discharge status: Home / Self Care | Payer: Medicare HMO | Source: Ambulatory Visit | Attending: Orthopedic Surgery | Admitting: Orthopedic Surgery

## 2016-10-04 DIAGNOSIS — R938 Abnormal findings on diagnostic imaging of other specified body structures: Secondary | ICD-10-CM | POA: Insufficient documentation

## 2016-10-04 DIAGNOSIS — K760 Fatty (change of) liver, not elsewhere classified: Secondary | ICD-10-CM | POA: Diagnosis not present

## 2016-10-04 DIAGNOSIS — I2510 Atherosclerotic heart disease of native coronary artery without angina pectoris: Secondary | ICD-10-CM | POA: Diagnosis not present

## 2016-10-04 DIAGNOSIS — M5134 Other intervertebral disc degeneration, thoracic region: Secondary | ICD-10-CM | POA: Diagnosis not present

## 2016-10-04 DIAGNOSIS — J439 Emphysema, unspecified: Secondary | ICD-10-CM | POA: Diagnosis not present

## 2016-10-04 DIAGNOSIS — I700 Atherosclerosis of aorta: Secondary | ICD-10-CM | POA: Diagnosis not present

## 2016-10-04 DIAGNOSIS — R9389 Abnormal findings on diagnostic imaging of other specified body structures: Secondary | ICD-10-CM

## 2016-10-04 MED ORDER — IOPAMIDOL (ISOVUE-370) INJECTION 76%: 75.0000 mL | Freq: Once | INTRAVENOUS | Status: DC | PRN

## 2016-10-04 MED ORDER — IOPAMIDOL (ISOVUE-300) INJECTION 61%
75.0000 mL | Freq: Once | INTRAVENOUS | Status: AC | PRN
  Administered 2016-10-04: 75 mL via INTRAVENOUS

## 2016-10-08 MED ORDER — VANCOMYCIN HCL IN DEXTROSE 1-5 GM/200ML-% IV SOLN
1000.0000 mg | INTRAVENOUS | Status: AC
  Administered 2016-10-09: 1000 mg via INTRAVENOUS
  Filled 2016-10-08: qty 200

## 2016-10-08 MED ORDER — ACETAMINOPHEN 10 MG/ML IV SOLN
1000.0000 mg | INTRAVENOUS | Status: AC
  Administered 2016-10-09: 1000 mg via INTRAVENOUS
  Filled 2016-10-08: qty 100

## 2016-10-08 MED ORDER — TRANEXAMIC ACID 1000 MG/10ML IV SOLN
2000.0000 mg | INTRAVENOUS | Status: AC
  Administered 2016-10-09: 2000 mg via TOPICAL
  Filled 2016-10-08 (×2): qty 20

## 2016-10-08 MED ORDER — SODIUM CHLORIDE 0.9 % IV SOLN: INTRAVENOUS | Status: DC

## 2016-10-09 ENCOUNTER — Inpatient Hospital Stay (HOSPITAL_COMMUNITY): Payer: Medicare HMO | Admitting: Emergency Medicine

## 2016-10-09 ENCOUNTER — Encounter (HOSPITAL_COMMUNITY): Payer: Self-pay | Admitting: Certified Registered Nurse Anesthetist

## 2016-10-09 ENCOUNTER — Encounter (HOSPITAL_COMMUNITY)
Admission: RE | Disposition: A | Discharge status: Home Health | Payer: Self-pay | Source: Ambulatory Visit | Attending: Orthopaedic Surgery

## 2016-10-09 ENCOUNTER — Inpatient Hospital Stay (HOSPITAL_COMMUNITY): Payer: Medicare HMO

## 2016-10-09 ENCOUNTER — Inpatient Hospital Stay (HOSPITAL_COMMUNITY)
Admission: RE | Admit: 2016-10-09 | Discharge: 2016-10-11 | DRG: 470 | Disposition: A | Discharge status: Home Health | Payer: Medicare HMO | Source: Ambulatory Visit | Attending: Orthopaedic Surgery | Admitting: Orthopaedic Surgery

## 2016-10-09 ENCOUNTER — Inpatient Hospital Stay (HOSPITAL_COMMUNITY): Payer: Medicare HMO | Admitting: Certified Registered Nurse Anesthetist

## 2016-10-09 DIAGNOSIS — M25552 Pain in left hip: Secondary | ICD-10-CM | POA: Diagnosis present

## 2016-10-09 DIAGNOSIS — Z888 Allergy status to other drugs, medicaments and biological substances status: Secondary | ICD-10-CM

## 2016-10-09 DIAGNOSIS — Z809 Family history of malignant neoplasm, unspecified: Secondary | ICD-10-CM

## 2016-10-09 DIAGNOSIS — I10 Essential (primary) hypertension: Secondary | ICD-10-CM | POA: Diagnosis present

## 2016-10-09 DIAGNOSIS — G8929 Other chronic pain: Secondary | ICD-10-CM | POA: Diagnosis present

## 2016-10-09 DIAGNOSIS — Z9049 Acquired absence of other specified parts of digestive tract: Secondary | ICD-10-CM | POA: Diagnosis not present

## 2016-10-09 DIAGNOSIS — Z90710 Acquired absence of both cervix and uterus: Secondary | ICD-10-CM | POA: Diagnosis not present

## 2016-10-09 DIAGNOSIS — Z96649 Presence of unspecified artificial hip joint: Secondary | ICD-10-CM

## 2016-10-09 DIAGNOSIS — Z818 Family history of other mental and behavioral disorders: Secondary | ICD-10-CM

## 2016-10-09 DIAGNOSIS — Z96641 Presence of right artificial hip joint: Secondary | ICD-10-CM | POA: Diagnosis present

## 2016-10-09 DIAGNOSIS — Z881 Allergy status to other antibiotic agents status: Secondary | ICD-10-CM

## 2016-10-09 DIAGNOSIS — E876 Hypokalemia: Secondary | ICD-10-CM | POA: Diagnosis present

## 2016-10-09 DIAGNOSIS — M1612 Unilateral primary osteoarthritis, left hip: Principal | ICD-10-CM | POA: Diagnosis present

## 2016-10-09 DIAGNOSIS — Z880 Allergy status to penicillin: Secondary | ICD-10-CM | POA: Diagnosis not present

## 2016-10-09 DIAGNOSIS — F17210 Nicotine dependence, cigarettes, uncomplicated: Secondary | ICD-10-CM | POA: Diagnosis present

## 2016-10-09 DIAGNOSIS — Z882 Allergy status to sulfonamides status: Secondary | ICD-10-CM | POA: Diagnosis not present

## 2016-10-09 DIAGNOSIS — Z8249 Family history of ischemic heart disease and other diseases of the circulatory system: Secondary | ICD-10-CM | POA: Diagnosis not present

## 2016-10-09 DIAGNOSIS — Z885 Allergy status to narcotic agent status: Secondary | ICD-10-CM

## 2016-10-09 DIAGNOSIS — Z96642 Presence of left artificial hip joint: Secondary | ICD-10-CM

## 2016-10-09 HISTORY — DX: Low back pain: M54.5

## 2016-10-09 HISTORY — DX: Low back pain, unspecified: M54.50

## 2016-10-09 HISTORY — DX: Radiculopathy, site unspecified: M54.10

## 2016-10-09 HISTORY — DX: Neuralgia and neuritis, unspecified: M79.2

## 2016-10-09 HISTORY — PX: TOTAL HIP ARTHROPLASTY: SHX124

## 2016-10-09 SURGERY — ARTHROPLASTY, HIP, TOTAL,POSTERIOR APPROACH
Anesthesia: General | Site: Hip | Laterality: Left

## 2016-10-09 MED ORDER — PHENYLEPHRINE HCL 10 MG/ML IJ SOLN
INTRAVENOUS | Status: DC | PRN
  Administered 2016-10-09: 25 ug/min via INTRAVENOUS

## 2016-10-09 MED ORDER — FENTANYL CITRATE (PF) 100 MCG/2ML IJ SOLN
INTRAMUSCULAR | Status: DC | PRN
  Administered 2016-10-09 (×2): 100 ug via INTRAVENOUS
  Administered 2016-10-09: 50 ug via INTRAVENOUS

## 2016-10-09 MED ORDER — PROPOFOL 10 MG/ML IV BOLUS
INTRAVENOUS | Status: AC
  Filled 2016-10-09: qty 20

## 2016-10-09 MED ORDER — ONDANSETRON HCL 4 MG PO TABS: 4.0000 mg | ORAL_TABLET | Freq: Four times a day (QID) | ORAL | Status: DC | PRN

## 2016-10-09 MED ORDER — PROPOFOL 10 MG/ML IV BOLUS
INTRAVENOUS | Status: DC | PRN
  Administered 2016-10-09: 110 mg via INTRAVENOUS

## 2016-10-09 MED ORDER — KETOROLAC TROMETHAMINE 15 MG/ML IJ SOLN
7.5000 mg | Freq: Four times a day (QID) | INTRAMUSCULAR | Status: AC
  Administered 2016-10-09 – 2016-10-10 (×4): 7.5 mg via INTRAVENOUS
  Filled 2016-10-09 (×3): qty 1

## 2016-10-09 MED ORDER — FENTANYL CITRATE (PF) 250 MCG/5ML IJ SOLN
INTRAMUSCULAR | Status: AC
  Filled 2016-10-09: qty 5

## 2016-10-09 MED ORDER — ROCURONIUM BROMIDE 100 MG/10ML IV SOLN
INTRAVENOUS | Status: DC | PRN
  Administered 2016-10-09: 30 mg via INTRAVENOUS
  Administered 2016-10-09: 10 mg via INTRAVENOUS
  Administered 2016-10-09: 20 mg via INTRAVENOUS

## 2016-10-09 MED ORDER — BISACODYL 10 MG RE SUPP: 10.0000 mg | Freq: Every day | RECTAL | Status: DC | PRN

## 2016-10-09 MED ORDER — KETOROLAC TROMETHAMINE 15 MG/ML IJ SOLN
INTRAMUSCULAR | Status: AC
  Filled 2016-10-09: qty 1

## 2016-10-09 MED ORDER — MIDAZOLAM HCL 5 MG/5ML IJ SOLN
INTRAMUSCULAR | Status: DC | PRN
  Administered 2016-10-09 (×2): 1 mg via INTRAVENOUS

## 2016-10-09 MED ORDER — MENTHOL 3 MG MT LOZG: 1.0000 | LOZENGE | OROMUCOSAL | Status: DC | PRN

## 2016-10-09 MED ORDER — ALBUTEROL SULFATE (2.5 MG/3ML) 0.083% IN NEBU
INHALATION_SOLUTION | RESPIRATORY_TRACT | Status: AC
  Filled 2016-10-09: qty 3

## 2016-10-09 MED ORDER — METHOCARBAMOL 500 MG PO TABS
ORAL_TABLET | ORAL | Status: AC
  Administered 2016-10-09: 500 mg via ORAL
  Filled 2016-10-09: qty 1

## 2016-10-09 MED ORDER — HYDROMORPHONE HCL 1 MG/ML IJ SOLN: 0.5000 mg | INTRAMUSCULAR | Status: DC | PRN

## 2016-10-09 MED ORDER — MAGNESIUM CITRATE PO SOLN: 1.0000 | Freq: Once | ORAL | Status: DC | PRN

## 2016-10-09 MED ORDER — FENTANYL CITRATE (PF) 100 MCG/2ML IJ SOLN
25.0000 ug | INTRAMUSCULAR | Status: DC | PRN
  Administered 2016-10-09 (×3): 50 ug via INTRAVENOUS

## 2016-10-09 MED ORDER — ONDANSETRON HCL 4 MG/2ML IJ SOLN
4.0000 mg | Freq: Four times a day (QID) | INTRAMUSCULAR | Status: DC | PRN
  Administered 2016-10-09: 4 mg via INTRAVENOUS
  Filled 2016-10-09: qty 2

## 2016-10-09 MED ORDER — MAGNESIUM HYDROXIDE 400 MG/5ML PO SUSP: 30.0000 mL | Freq: Every day | ORAL | Status: DC | PRN

## 2016-10-09 MED ORDER — SODIUM CHLORIDE 0.9 % IR SOLN
Status: DC | PRN
  Administered 2016-10-09: 1000 mL

## 2016-10-09 MED ORDER — SUGAMMADEX SODIUM 200 MG/2ML IV SOLN
INTRAVENOUS | Status: DC | PRN
  Administered 2016-10-09: 150 mg via INTRAVENOUS

## 2016-10-09 MED ORDER — PHENOL 1.4 % MT LIQD: 1.0000 | OROMUCOSAL | Status: DC | PRN

## 2016-10-09 MED ORDER — VANCOMYCIN HCL IN DEXTROSE 1-5 GM/200ML-% IV SOLN
1000.0000 mg | Freq: Two times a day (BID) | INTRAVENOUS | Status: AC
  Administered 2016-10-09: 1000 mg via INTRAVENOUS
  Filled 2016-10-09: qty 200

## 2016-10-09 MED ORDER — METOCLOPRAMIDE HCL 5 MG PO TABS: 5.0000 mg | ORAL_TABLET | Freq: Three times a day (TID) | ORAL | Status: DC | PRN

## 2016-10-09 MED ORDER — METHOCARBAMOL 500 MG PO TABS
500.0000 mg | ORAL_TABLET | Freq: Four times a day (QID) | ORAL | Status: DC | PRN
  Administered 2016-10-09 – 2016-10-11 (×3): 500 mg via ORAL
  Filled 2016-10-09 (×2): qty 1

## 2016-10-09 MED ORDER — ALUM & MAG HYDROXIDE-SIMETH 200-200-20 MG/5ML PO SUSP: 30.0000 mL | ORAL | Status: DC | PRN

## 2016-10-09 MED ORDER — FENTANYL CITRATE (PF) 100 MCG/2ML IJ SOLN
INTRAMUSCULAR | Status: AC
  Filled 2016-10-09: qty 2

## 2016-10-09 MED ORDER — BUPIVACAINE-EPINEPHRINE 0.25% -1:200000 IJ SOLN
INTRAMUSCULAR | Status: AC
  Filled 2016-10-09: qty 1

## 2016-10-09 MED ORDER — DOCUSATE SODIUM 100 MG PO CAPS
100.0000 mg | ORAL_CAPSULE | Freq: Two times a day (BID) | ORAL | Status: DC
  Administered 2016-10-09 – 2016-10-11 (×4): 100 mg via ORAL
  Filled 2016-10-09 (×4): qty 1

## 2016-10-09 MED ORDER — LIDOCAINE HCL (CARDIAC) 20 MG/ML IV SOLN
INTRAVENOUS | Status: DC | PRN
  Administered 2016-10-09: 100 mg via INTRAVENOUS

## 2016-10-09 MED ORDER — SUGAMMADEX SODIUM 200 MG/2ML IV SOLN
INTRAVENOUS | Status: AC
  Filled 2016-10-09: qty 2

## 2016-10-09 MED ORDER — DEXAMETHASONE SODIUM PHOSPHATE 10 MG/ML IJ SOLN
INTRAMUSCULAR | Status: DC | PRN
  Administered 2016-10-09: 10 mg via INTRAVENOUS

## 2016-10-09 MED ORDER — ARTIFICIAL TEARS OPHTHALMIC OINT
TOPICAL_OINTMENT | OPHTHALMIC | Status: DC | PRN
  Administered 2016-10-09: 1 via OPHTHALMIC

## 2016-10-09 MED ORDER — DIPHENHYDRAMINE HCL 12.5 MG/5ML PO ELIX: 12.5000 mg | ORAL_SOLUTION | ORAL | Status: DC | PRN

## 2016-10-09 MED ORDER — ROCURONIUM BROMIDE 50 MG/5ML IV SOLN
INTRAVENOUS | Status: AC
  Filled 2016-10-09: qty 1

## 2016-10-09 MED ORDER — LIDOCAINE HCL (CARDIAC) 20 MG/ML IV SOLN
INTRAVENOUS | Status: AC
  Filled 2016-10-09: qty 5

## 2016-10-09 MED ORDER — FENTANYL CITRATE (PF) 100 MCG/2ML IJ SOLN
INTRAMUSCULAR | Status: AC
  Administered 2016-10-09: 50 ug via INTRAVENOUS
  Filled 2016-10-09: qty 2

## 2016-10-09 MED ORDER — ALBUTEROL SULFATE (2.5 MG/3ML) 0.083% IN NEBU
2.5000 mg | INHALATION_SOLUTION | Freq: Once | RESPIRATORY_TRACT | Status: AC
  Administered 2016-10-09: 2.5 mg via RESPIRATORY_TRACT

## 2016-10-09 MED ORDER — BUPIVACAINE-EPINEPHRINE (PF) 0.25% -1:200000 IJ SOLN
INTRAMUSCULAR | Status: DC | PRN
  Administered 2016-10-09: 30 mL via PERINEURAL

## 2016-10-09 MED ORDER — RIVAROXABAN 10 MG PO TABS
10.0000 mg | ORAL_TABLET | Freq: Every day | ORAL | Status: DC
  Administered 2016-10-10 – 2016-10-11 (×2): 10 mg via ORAL
  Filled 2016-10-09 (×2): qty 1

## 2016-10-09 MED ORDER — DEXAMETHASONE SODIUM PHOSPHATE 10 MG/ML IJ SOLN
INTRAMUSCULAR | Status: AC
  Filled 2016-10-09: qty 1

## 2016-10-09 MED ORDER — METHOCARBAMOL 1000 MG/10ML IJ SOLN
500.0000 mg | Freq: Four times a day (QID) | INTRAVENOUS | Status: DC | PRN
  Filled 2016-10-09: qty 5

## 2016-10-09 MED ORDER — MIDAZOLAM HCL 2 MG/2ML IJ SOLN
INTRAMUSCULAR | Status: AC
  Filled 2016-10-09: qty 2

## 2016-10-09 MED ORDER — ONDANSETRON HCL 4 MG/2ML IJ SOLN
INTRAMUSCULAR | Status: DC | PRN
  Administered 2016-10-09: 4 mg via INTRAVENOUS

## 2016-10-09 MED ORDER — STERILE WATER FOR IRRIGATION IR SOLN
Status: DC | PRN
  Administered 2016-10-09: 1000 mL

## 2016-10-09 MED ORDER — HYDROMORPHONE HCL 2 MG PO TABS
2.0000 mg | ORAL_TABLET | ORAL | Status: DC | PRN
  Administered 2016-10-10 – 2016-10-11 (×3): 2 mg via ORAL
  Filled 2016-10-09 (×3): qty 1

## 2016-10-09 MED ORDER — AMLODIPINE BESYLATE 10 MG PO TABS
10.0000 mg | ORAL_TABLET | Freq: Every day | ORAL | Status: DC
  Administered 2016-10-10 – 2016-10-11 (×2): 10 mg via ORAL
  Filled 2016-10-09 (×2): qty 1

## 2016-10-09 MED ORDER — ONDANSETRON HCL 4 MG/2ML IJ SOLN
INTRAMUSCULAR | Status: AC
  Filled 2016-10-09: qty 2

## 2016-10-09 MED ORDER — LACTATED RINGERS IV SOLN
INTRAVENOUS | Status: DC
  Administered 2016-10-09: 12:00:00 via INTRAVENOUS
  Administered 2016-10-09: 50 mL/h via INTRAVENOUS

## 2016-10-09 MED ORDER — ACETAMINOPHEN 10 MG/ML IV SOLN
1000.0000 mg | Freq: Four times a day (QID) | INTRAVENOUS | Status: AC
  Administered 2016-10-09 – 2016-10-10 (×4): 1000 mg via INTRAVENOUS
  Filled 2016-10-09 (×4): qty 100

## 2016-10-09 MED ORDER — SODIUM CHLORIDE 0.9 % IV SOLN
75.0000 mL/h | INTRAVENOUS | Status: DC
  Administered 2016-10-09: 75 mL/h via INTRAVENOUS

## 2016-10-09 MED ORDER — METOCLOPRAMIDE HCL 5 MG/ML IJ SOLN
5.0000 mg | Freq: Three times a day (TID) | INTRAMUSCULAR | Status: DC | PRN
  Administered 2016-10-09: 5 mg via INTRAVENOUS
  Filled 2016-10-09: qty 2

## 2016-10-09 SURGICAL SUPPLY — 57 items
BLADE SAW SAG 73X25 THK (BLADE) ×2
BLADE SAW SGTL 73X25 THK (BLADE) ×1 IMPLANT
BRUSH FEMORAL CANAL (MISCELLANEOUS) IMPLANT
CAPT HIP TOTAL 2 ×2 IMPLANT
COVER SURGICAL LIGHT HANDLE (MISCELLANEOUS) ×3 IMPLANT
DRAPE INCISE IOBAN 66X45 STRL (DRAPES) ×2 IMPLANT
DRAPE ORTHO SPLIT 77X108 STRL (DRAPES) ×6
DRAPE SURG ORHT 6 SPLT 77X108 (DRAPES) ×2 IMPLANT
DRSG MEPILEX BORDER 4X12 (GAUZE/BANDAGES/DRESSINGS) ×3 IMPLANT
DRSG MEPILEX BORDER 4X8 (GAUZE/BANDAGES/DRESSINGS) ×2 IMPLANT
DURAPREP 26ML APPLICATOR (WOUND CARE) ×6 IMPLANT
ELECT BLADE 6.5 EXT (BLADE) ×2 IMPLANT
ELECT REM PT RETURN 9FT ADLT (ELECTROSURGICAL) ×3
ELECTRODE REM PT RTRN 9FT ADLT (ELECTROSURGICAL) ×1 IMPLANT
EVACUATOR 1/8 PVC DRAIN (DRAIN) IMPLANT
FACESHIELD WRAPAROUND (MASK) ×9 IMPLANT
FACESHIELD WRAPAROUND OR TEAM (MASK) ×3 IMPLANT
GLOVE BIOGEL PI IND STRL 8 (GLOVE) ×2 IMPLANT
GLOVE BIOGEL PI IND STRL 8.5 (GLOVE) ×1 IMPLANT
GLOVE BIOGEL PI INDICATOR 8 (GLOVE) ×4
GLOVE BIOGEL PI INDICATOR 8.5 (GLOVE) ×2
GLOVE ECLIPSE 8.0 STRL XLNG CF (GLOVE) ×6 IMPLANT
GLOVE SURG ORTHO 8.5 STRL (GLOVE) ×6 IMPLANT
GOWN STRL REUS W/ TWL LRG LVL3 (GOWN DISPOSABLE) ×2 IMPLANT
GOWN STRL REUS W/TWL 2XL LVL3 (GOWN DISPOSABLE) ×4 IMPLANT
GOWN STRL REUS W/TWL LRG LVL3 (GOWN DISPOSABLE) ×6
HANDPIECE INTERPULSE COAX TIP (DISPOSABLE)
IMMOBILIZER KNEE 22 UNIV (SOFTGOODS) ×3 IMPLANT
KIT BASIN OR (CUSTOM PROCEDURE TRAY) ×3 IMPLANT
KIT ROOM TURNOVER OR (KITS) ×3 IMPLANT
MANIFOLD NEPTUNE II (INSTRUMENTS) ×3 IMPLANT
NEEDLE 22X1 1/2 (OR ONLY) (NEEDLE) ×3 IMPLANT
NS IRRIG 1000ML POUR BTL (IV SOLUTION) ×3 IMPLANT
PACK TOTAL JOINT (CUSTOM PROCEDURE TRAY) ×3 IMPLANT
PAD ARMBOARD 7.5X6 YLW CONV (MISCELLANEOUS) ×5 IMPLANT
PRESSURIZER FEMORAL UNIV (MISCELLANEOUS) IMPLANT
SET HNDPC FAN SPRY TIP SCT (DISPOSABLE) IMPLANT
STAPLER VISISTAT 35W (STAPLE) IMPLANT
SUCTION FRAZIER HANDLE 10FR (MISCELLANEOUS) ×2
SUCTION TUBE FRAZIER 10FR DISP (MISCELLANEOUS) ×1 IMPLANT
SUT BONE WAX W31G (SUTURE) ×2 IMPLANT
SUT ETHIBOND NAB CT1 #1 30IN (SUTURE) ×9 IMPLANT
SUT MNCRL AB 3-0 PS2 18 (SUTURE) ×3 IMPLANT
SUT VIC AB 0 CT1 27 (SUTURE) ×6
SUT VIC AB 0 CT1 27XBRD ANBCTR (SUTURE) ×2 IMPLANT
SUT VIC AB 1 CT1 27 (SUTURE) ×6
SUT VIC AB 1 CT1 27XBRD ANBCTR (SUTURE) ×2 IMPLANT
SUT VIC AB 2-0 CT1 27 (SUTURE) ×3
SUT VIC AB 2-0 CT1 TAPERPNT 27 (SUTURE) ×1 IMPLANT
SYR CONTROL 10ML LL (SYRINGE) ×3 IMPLANT
TOWEL OR 17X24 6PK STRL BLUE (TOWEL DISPOSABLE) ×3 IMPLANT
TOWEL OR 17X26 10 PK STRL BLUE (TOWEL DISPOSABLE) ×3 IMPLANT
TOWER CARTRIDGE SMART MIX (DISPOSABLE) IMPLANT
TRAY CATH 16FR W/PLASTIC CATH (SET/KITS/TRAYS/PACK) ×2 IMPLANT
WATER STERILE IRR 1000ML POUR (IV SOLUTION) ×6 IMPLANT
WRAP KNEE MAXI GEL POST OP (GAUZE/BANDAGES/DRESSINGS) ×3 IMPLANT
YANKAUER SUCT BULB TIP NO VENT (SUCTIONS) ×2 IMPLANT

## 2016-10-09 NOTE — Progress Notes (Signed)
Orthopedic Tech Progress Note Patient Details:  Jessica PeaOla S Spampinato 1945-03-27 [id number] Patient has knee immobilizer. Patient ID: Jessica Hoover, female   DOB: 1945-03-27, 71 y.o.   MRN: [id number]   [name], [name] 10/09/2016, 3:05 PM

## 2016-10-09 NOTE — Anesthesia Procedure Notes (Signed)
Procedure Name: Intubation Date/Time: 10/09/2016 10:19 AM Performed by: Jed LimerickHARDER, Dameka Younker S Pre-anesthesia Checklist: Patient identified, Emergency Drugs available, Suction available and Patient being monitored Patient Re-evaluated:Patient Re-evaluated prior to induction Oxygen Delivery Method: Circle System Utilized Preoxygenation: Pre-oxygenation with 100% oxygen Induction Type: IV induction Ventilation: Mask ventilation without difficulty Laryngoscope Size: Glidescope and 3 Grade View: Grade I Tube type: Oral Tube size: 7.5 mm Number of attempts: 1 Airway Equipment and Method: Stylet and Video-laryngoscopy Placement Confirmation: ETT inserted through vocal cords under direct vision,  positive ETCO2 and breath sounds checked- equal and bilateral Secured at: 22 cm Tube secured with: Tape Dental Injury: Teeth and Oropharynx as per pre-operative assessment  Difficulty Due To: Difficulty was anticipated Comments: Elective glidescope d/t previous grade III view with intubation and poor dentition

## 2016-10-09 NOTE — Anesthesia Preprocedure Evaluation (Addendum)
Anesthesia Evaluation  Patient identified by MRN, date of birth, ID band Patient awake    Reviewed: Allergy & Precautions, NPO status , Patient's Chart, lab work & pertinent test results  History of Anesthesia Complications (+) PONV  Airway Mallampati: III  TM Distance: <3 FB     Dental  (+) Dental Advisory Given, Poor Dentition   Pulmonary Current Smoker,    breath sounds clear to auscultation       Cardiovascular hypertension, Pt. on medications  Rhythm:Regular Rate:Normal     Neuro/Psych negative neurological ROS     GI/Hepatic negative GI ROS, Neg liver ROS,   Endo/Other  negative endocrine ROS  Renal/GU negative Renal ROS     Musculoskeletal  (+) Arthritis ,   Abdominal   Peds  Hematology negative hematology ROS (+)   Anesthesia Other Findings   Reproductive/Obstetrics                            Anesthesia Physical  Anesthesia Plan  ASA: II  Anesthesia Plan: General   Post-op Pain Management:    Induction: Intravenous  PONV Risk Score and Plan:   Airway Management Planned: Oral ETT and Video Laryngoscope Planned  Additional Equipment:   Intra-op Plan:   Post-operative Plan: Extubation in OR  Informed Consent: I have reviewed the patients History and Physical, chart, labs and discussed the procedure including the risks, benefits and alternatives for the proposed anesthesia with the patient or authorized representative who has indicated his/her understanding and acceptance.   Dental advisory given  Plan Discussed with: CRNA  Anesthesia Plan Comments: (Grade 3 view last GA in 8/16 for R Hip)        Anesthesia Quick Evaluation

## 2016-10-09 NOTE — Anesthesia Postprocedure Evaluation (Signed)
Anesthesia Post Note  Patient: Jessica Hoover  Procedure(s) Performed: Procedure(s) (LRB): LEFT TOTAL HIP ARTHROPLASTY (Left)     Patient location during evaluation: PACU Anesthesia Type: General Level of consciousness: awake and alert Pain management: pain level controlled Vital Signs Assessment: post-procedure vital signs reviewed and stable Respiratory status: spontaneous breathing, nonlabored ventilation, respiratory function stable and patient connected to nasal cannula oxygen Cardiovascular status: blood pressure returned to baseline and stable Postop Assessment: no signs of nausea or vomiting Anesthetic complications: no    Last Vitals:  Vitals:   10/09/16 1415 10/09/16 1430  BP: 132/71   Pulse: 83 81  Resp: (!) 9 12  Temp:      Last Pain:  Vitals:   10/09/16 1400  TempSrc:   PainSc: Asleep                 Jessica Hoover

## 2016-10-09 NOTE — Transfer of Care (Signed)
Immediate Anesthesia Transfer of Care Note  Patient: Jessica Hoover  Procedure(s) Performed: Procedure(s): LEFT TOTAL HIP ARTHROPLASTY (Left)  Patient Location: PACU  Anesthesia Type:General  Level of Consciousness: drowsy and patient cooperative  Airway & Oxygen Therapy: Patient Spontanous Breathing and Patient connected to nasal cannula oxygen  Post-op Assessment: Report given to RN and Post -op Vital signs reviewed and stable  Post vital signs: Reviewed and stable  Last Vitals:  Vitals:   10/09/16 0830 10/09/16 1300  BP: (!) 153/82 (!) (P) 154/76  Pulse: 64   Resp: 16   Temp: 36.4 C (P) 36.5 C    Last Pain:  Vitals:   10/09/16 0830  TempSrc: Oral         Complications: No apparent anesthesia complications

## 2016-10-09 NOTE — H&P (Signed)
The recent History & Physical has been reviewed. I have personally examined the patient today. There is no interval change to the documented History & Physical. The patient would like to proceed with the procedure.  Jessica Hoover [name] Axon 10/09/2016,  9:45 AM

## 2016-10-09 NOTE — Op Note (Signed)
PATIENT ID:      Dominic PeaOla S Anthis  MRN:     [id number] DOB/AGE:    07-26-1945 / 71 y.o.       OPERATIVE REPORT    DATE OF PROCEDURE:  10/09/2016       PREOPERATIVE DIAGNOSIS:END STAGE   LEFT HIP OSTEOARTHRITIS                                                       Estimated body mass index is 26.61 kg/m as calculated from the following:   Height as of this encounter: 5\' 8"  (1.727 m).   Weight as of this encounter: 175 lb (79.4 kg).     POSTOPERATIVE DIAGNOSIS: END STAGE  LEFT HIP OSTEOARTHRITIS                                                                     Estimated body mass index is 26.61 kg/m as calculated from the following:   Height as of this encounter: 5\' 8"  (1.727 m).   Weight as of this encounter: 175 lb (79.4 kg).     PROCEDURE:  Procedure(s): LEFT TOTAL HIP ARTHROPLASTY      SURGEON:  Norlene CampbellPeter Kunal Levario, MD    ASSISTANT:   Jacqualine CodeBrian Petrarca, PA-C   (Present and scrubbed throughout the case, critical for assistance with exposure, retraction, instrumentation, and closure.)          ANESTHESIA: general     DRAINS: none :      TOURNIQUET TIME: * No tourniquets in log *    COMPLICATIONS:  None   CONDITION:  stable  PROCEDURE IN DETAIL: [id number]  Valeria Batmaneter W Henlee Donovan 10/09/2016, 12:31 PM

## 2016-10-09 NOTE — Evaluation (Signed)
Physical Therapy Evaluation Patient Details Name: Jessica Hoover MRN: [id number] DOB: 10-17-1945 Today's Date: 10/09/2016   History of Present Illness  Patient is a 71 y/o female presents s/p left THA, posterior approach. PMH includes R THA, chronic shoulder, pelvic, back and sciatic pain, HTN, heart murmur.  Clinical Impression  Patient presents with nausea, pain and post surgical deficits s/p above surgery. Tolerated sitting EOB but OOB mobility deferred due to nausea, dry heaving and pain. Pt lives alone and independent PTA. Plan to d/c home with support of daughters. Will plan to progress mobility and initiate gait training tomorrow. Education re: posterior hip precautions, knee immobilizer etc. Pt too distracted by pain to participate in exercises. Will follow acutely to maximize independence and mobility prior to return home.    Follow Up Recommendations DC plan and follow up therapy as arranged by surgeon;Supervision for mobility/OOB    Equipment Recommendations  None recommended by PT    Recommendations for Other Services OT consult     Precautions / Restrictions Precautions Precautions: Posterior Hip Precaution Booklet Issued: No Precaution Comments: Reviewed precautions but no handout provided today Required Braces or Orthoses: Knee Immobilizer - Left Knee Immobilizer - Left: On at all times (in bed) Restrictions Weight Bearing Restrictions: Yes LLE Weight Bearing: Weight bearing as tolerated      Mobility  Bed Mobility Overal bed mobility: Needs Assistance Bed Mobility: Supine to Sit;Sit to Supine     Supine to sit: Min assist;HOB elevated Sit to supine: Min assist;HOB elevated   General bed mobility comments: Assist to bring LLE into/out of EOB, use of rail for support. Lots of pain.   Transfers                 General transfer comment: Deferred as pt started dry heaving once sitting EOB; + nausea. RN notified.   Ambulation/Gait             General  Gait Details: Deferred due to above.   Stairs            Wheelchair Mobility    Modified Rankin (Stroke Patients Only)       Balance Overall balance assessment: Needs assistance Sitting-balance support: Feet supported;Single extremity supported Sitting balance-Leahy Scale: Fair Sitting balance - Comments: Able to sit EOB unsupported.                                      Pertinent Vitals/Pain Pain Assessment: Faces Faces Pain Scale: Hurts worst Pain Location: left hip Pain Descriptors / Indicators: Operative site guarding;Guarding;Moaning;Restless Pain Intervention(s): Monitored during session;Patient requesting pain meds-RN notified;Ice applied;Limited activity within patient's tolerance;Repositioned    Home Living Family/patient expects to be discharged to:: Private residence Living Arrangements: Alone Available Help at Discharge: Family Type of Home: House Home Access: Stairs to enter Entrance Stairs-Rails: Right Entrance Stairs-Number of Steps: 3 Home Layout: One level Home Equipment: Walker - 2 wheels;Cane - single point;Crutches;Walker - 4 wheels      Prior Function Level of Independence: Independent               Hand Dominance   Dominant Hand: Right    Extremity/Trunk Assessment   Upper Extremity Assessment Upper Extremity Assessment: Defer to OT evaluation    Lower Extremity Assessment Lower Extremity Assessment: LLE deficits/detail LLE Deficits / Details: Able to wiggle toes; other joints not assessed due to pain and in KI. LLE Sensation:  decreased light touch       Communication   Communication: No difficulties  Cognition Arousal/Alertness:  (sleepy) Behavior During Therapy: Flat affect Overall Cognitive Status: Within Functional Limits for tasks assessed                                 General Comments: Seems WFL for basic tasks but very distracted by pain and sleepy as well.      General Comments  General comments (skin integrity, edema, etc.): VSS throughout.    Exercises     Assessment/Plan    PT Assessment Patient needs continued PT services  PT Problem List Decreased strength;Decreased mobility;Pain;Impaired sensation;Decreased balance;Decreased skin integrity;Decreased range of motion;Decreased activity tolerance;Decreased knowledge of precautions       PT Treatment Interventions DME instruction;Therapeutic activities;Therapeutic exercise;Functional mobility training;Balance training;Stair training;Gait training;Patient/family education    PT Goals (Current goals can be found in the Care Plan section)  Acute Rehab PT Goals Patient Stated Goal: to make this pain go away PT Goal Formulation: With patient Time For Goal Achievement: 10/23/16 Potential to Achieve Goals: Fair    Frequency 7X/week   Barriers to discharge Decreased caregiver support      Co-evaluation               AM-PAC PT "6 Clicks" Daily Activity  Outcome Measure Difficulty turning over in bed (including adjusting bedclothes, sheets and blankets)?: None Difficulty moving from lying on back to sitting on the side of the bed? : Total Difficulty sitting down on and standing up from a chair with arms (e.g., wheelchair, bedside commode, etc,.)?: Total Help needed moving to and from a bed to chair (including a wheelchair)?: A Little Help needed walking in hospital room?: A Little Help needed climbing 3-5 steps with a railing? : A Lot 6 Click Score: 14    End of Session Equipment Utilized During Treatment: Left knee immobilizer Activity Tolerance: Patient limited by pain;Other (comment) (nausea) Patient left: in bed;with call bell/phone within reach;with bed alarm set;with SCD's reapplied Nurse Communication: Mobility status;Other (comment) (nausea meds) PT Visit Diagnosis: Pain;Difficulty in walking, not elsewhere classified (R26.2) Pain - Right/Left: Left Pain - part of body: Hip    Time:  9604-5409 PT Time Calculation (min) (ACUTE ONLY): 15 min   Charges:   PT Evaluation $PT Eval Low Complexity: 1 Procedure     PT G Codes:        Mylo Red, PT, DPT [phone]    Blake Divine A Donica Derouin 10/09/2016, 4:22 PM

## 2016-10-10 ENCOUNTER — Encounter (HOSPITAL_COMMUNITY): Payer: Self-pay | Admitting: Orthopaedic Surgery

## 2016-10-10 LAB — CBC
HEMATOCRIT: 33.3 % — AB (ref 36.0–46.0)
HEMOGLOBIN: 11 g/dL — AB (ref 12.0–15.0)
MCH: 28.1 pg (ref 26.0–34.0)
MCHC: 33 g/dL (ref 30.0–36.0)
MCV: 85.2 fL (ref 78.0–100.0)
Platelets: 307 10*3/uL (ref 150–400)
RBC: 3.91 MIL/uL (ref 3.87–5.11)
RDW: 13.6 % (ref 11.5–15.5)
WBC: 11 10*3/uL — ABNORMAL HIGH (ref 4.0–10.5)

## 2016-10-10 LAB — BASIC METABOLIC PANEL
Anion gap: 14 (ref 5–15)
BUN: 12 mg/dL (ref 6–20)
CHLORIDE: 100 mmol/L — AB (ref 101–111)
CO2: 22 mmol/L (ref 22–32)
CREATININE: 1.08 mg/dL — AB (ref 0.44–1.00)
Calcium: 8.9 mg/dL (ref 8.9–10.3)
GFR calc Af Amer: 58 mL/min — ABNORMAL LOW (ref >60)
GFR calc non Af Amer: 50 mL/min — ABNORMAL LOW (ref >60)
Glucose, Bld: 166 mg/dL — ABNORMAL HIGH (ref 65–99)
Potassium: 3.2 mmol/L — ABNORMAL LOW (ref 3.5–5.1)
SODIUM: 136 mmol/L (ref 135–145)

## 2016-10-10 MED ORDER — POTASSIUM CHLORIDE CRYS ER 20 MEQ PO TBCR
20.0000 meq | EXTENDED_RELEASE_TABLET | Freq: Two times a day (BID) | ORAL | Status: AC
  Administered 2016-10-10 (×2): 20 meq via ORAL
  Filled 2016-10-10 (×2): qty 1

## 2016-10-10 NOTE — Progress Notes (Signed)
Physical Therapy Treatment Patient Details Name: Jessica Hoover MRN: [id number] DOB: 04-15-1945 Today's Date: 10/10/2016    History of Present Illness Patient is a 71 y/o female presents s/p left THA, posterior approach. PMH includes R THA, chronic shoulder, pelvic, back and sciatic pain, HTN, heart murmur.    PT Comments    Pt continues to show impulsivity with transfers and gait; however pt has fair balance and requires no physical assistance. Pt able to ambulate entire unit with straight cane with improved form compared to RW. Pt also able to navigate stairs without assistance and seems ready to d/c home. Will follow acutely for mobility with straight cane and strengthening to return to PLOF.    Follow Up Recommendations  DC plan and follow up therapy as arranged by surgeon;Supervision for mobility/OOB     Equipment Recommendations  None recommended by PT    Recommendations for Other Services       Precautions / Restrictions Precautions Precautions: Posterior Hip;Fall Precaution Booklet Issued: Yes (comment) Precaution Comments: Reviewed precautions and handout given  Restrictions Weight Bearing Restrictions: Yes LLE Weight Bearing: Weight bearing as tolerated    Mobility  Bed Mobility Overal bed mobility: Modified Independent Bed Mobility: Supine to Sit     Supine to sit: Modified independent (Device/Increase time)     General bed mobility comments: Mod I with use of bed rails and increased time; no physical assist   Transfers Overall transfer level: Needs assistance   Transfers: Sit to/from Stand Sit to Stand: Min guard         General transfer comment: Min guard for safety and max VCs for slowing down movements and addressing impulsivity   Ambulation/Gait Ambulation/Gait assistance: Min guard Ambulation Distance (Feet): 500 Feet Assistive device: Straight cane Gait Pattern/deviations: Step-through pattern     General Gait Details: Min guard for safety.  Pt with more fluid gait than with RW. Initial VCs given for sequencing and tp was able to follow through without additional VCs.    Stairs Stairs: Yes   Stair Management: One rail Right;With cane Number of Stairs: 5 General stair comments: Min guard for safety. Pt with step through pattern and  proper use of cane.   Wheelchair Mobility    Modified Rankin (Stroke Patients Only)       Balance Overall balance assessment: Needs assistance Sitting-balance support: No upper extremity supported;Feet unsupported Sitting balance-Leahy Scale: Good Sitting balance - Comments: Able to sit EOB unsupported.    Standing balance support: Single extremity supported Standing balance-Leahy Scale: Fair Standing balance comment: Pt demonstrated fair balance with straight cane during ambulation and seemed to be even more stable and comfortable using cane rather than RW.                             Cognition Arousal/Alertness: Awake/alert Behavior During Therapy: Impulsive Overall Cognitive Status: Within Functional Limits for tasks assessed                                 General Comments: Seems WNL for tasks; however pt impulsive with transfers, quick to move       Exercises      General Comments        Pertinent Vitals/Pain Pain Assessment: No/denies pain    Home Living  Prior Function            PT Goals (current goals can now be found in the care plan section) Acute Rehab PT Goals Patient Stated Goal: go home PT Goal Formulation: With patient Time For Goal Achievement: 10/24/16 Potential to Achieve Goals: Good Progress towards PT goals: Progressing toward goals    Frequency    7X/week      PT Plan Current plan remains appropriate    Co-evaluation              AM-PAC PT "6 Clicks" Daily Activity  Outcome Measure  Difficulty turning over in bed (including adjusting bedclothes, sheets and blankets)?:  None Difficulty moving from lying on back to sitting on the side of the bed? : Total   Help needed moving to and from a bed to chair (including a wheelchair)?: A Little Help needed walking in hospital room?: A Little Help needed climbing 3-5 steps with a railing? : A Little 6 Click Score: 14    End of Session   Activity Tolerance: Patient tolerated treatment well;No increased pain Patient left: in bed;with call bell/phone within reach;with bed alarm set   PT Visit Diagnosis: Pain;Difficulty in walking, not elsewhere classified (R26.2) Pain - Right/Left: Left Pain - part of body: Hip     Time: 0320-0351 PT Time Calculation (min) (ACUTE ONLY): 31 min  Charges:  $Gait Training: 23-37 mins                    G Codes:       Jessica ClicheVictoria Coni Hoover, SPT Acute Rehab [phone]    Jessica ClicheVictoria Deicy Hoover 10/10/2016, 5:14 PM

## 2016-10-10 NOTE — Op Note (Signed)
NAME:  Crosley, Mileigh                        ACCOUNT NO.:  MEDICAL RECORD NO.:  [id number]  LOCATION:                                 FACILITY:  PHYSICIAN:  Vonna Kotyk. Durward Fortes, M.D.    DATE OF BIRTH:  DATE OF PROCEDURE:  10/09/2016 DATE OF DISCHARGE:                              OPERATIVE REPORT   PREOPERATIVE DIAGNOSIS:  End-stage osteoarthritis, left hip.  POSTOPERATIVE DIAGNOSIS:  End-stage osteoarthritis, left hip.  PROCEDURE:  Left total hip replacement.  SURGEON:  Vonna Kotyk. Durward Fortes, M.D.  ASSISTANT:  Biagio Borg, PA-C, was present throughout the operative procedure to ensure its timely completion.  ANESTHESIA:  General.  COMPLICATIONS:  None.  COMPONENTS:  DePuy AML 13.5-mm small-stature femoral component, a 52-mm outer diameter Gription 3 acetabular shell with a Marathon polyethylene liner +4 with a 10-degree posterior lip apex hole eliminator.  DESCRIPTION OF PROCEDURE:  Ms. Schutt was met in the holding area with the family, identified the left hip was appropriate operative site and marked it accordingly.  The patient was then transported to room #7, placed under general anesthesia without difficulty.  Nursing staff inserted a Foley catheter.  Urine was clear.  The patient was then placed in the lateral decubitus position with the left side up and secured to the operating room table with the Innomed hip system.  The hip was then prepped with chlorhexidine scrub.  At that point, the nursing staff informed me that they found a hair in the instrument set.  Accordingly, we followed protocol and stripped the entire equipment table and obtained new sterile equipment.  We lost approximately 30 minutes.  During that time, the patient was perfectly stable.  We then re-prepped the left hip with chlorhexidine scrub and then DuraPrep x2 from iliac crest to the ankle.  Sterile draping was performed.  Time-out was called.  A routine Southern incision was utilized and via  sharp dissection incision carried down to subcutaneous tissue, the first layer to the iliotibial band.  Gross bleeders were Bovie coagulated.  Self-retaining retractors were inserted.  IT band was then incised along the length of the skin incision and retractors were placed more deeply.  With the hip internally rotated, the short external rotators were identified, carefully incised in their attachment to the posterior aspect of the greater trochanter and tagged.  The capsule was identified beneath and carefully incised on the femoral neck and head. The joint was entered.  There was a small clear yellow joint effusion. There was synovitis that exuded from the incision.  We could easily dislocate the femoral head posteriorly.  The head was misshapen and devoid of articular cartilage in multiple areas.  Using the calcar guide, we performed an osteotomy of the femoral neck a fingerbreadth proximal to the lesser trochanter.  Retractor was then placed around the proximal femur.  A starter hole was then made in the piriformis fossa, reaming was performed sequentially to 13 mm to accept a 13.5-mm component.  Rasping was performed sequentially to 13.5 small- stature component, fit nicely on the calcar with about 10 to 15 degrees of anteversion.  We did use a calcar  reamer to obtain the appropriate angle.  Retractors were then placed about the acetabulum.  The labrum was sharply excised.  There were areas of synovitis that were excised as well.  Reaming was performed sequentially to 51 mm to accept a 52 component.  I trialed a 50 and a 52 component.  The 50 mm would completely seat with good rim fit.  The 52 would not completely seat.  I then impacted the 52-mm outer diameter Gription 3 acetabular shell. This completely bottomed out and was very nice and secure.  I then inserted the trial polyethylene liner.  We then inserted the 13.5-mm trial femoral rasp.  We trailed several neck lengths and  thought the -2 was the best fit.  This was reduced and through a full range of motion, it remained perfectly stable.  There was no toggling.  No instability in flexion or extension, internal or external rotation.  Leg lengths appeared to be symmetrical.  Trial components were then removed.  The joint was copiously irrigated with saline solution.  Throughout the procedure, I checked the sciatic nerve to be sure it was well out of harm's way.  The apex hole eliminator was inserted into the acetabular component followed by the Marathon polyethylene liner +4 with a 10-degree posterior lip.  This was impacted in place.  The final femoral component was then impacted, I used the small-stature 13.5-mm component.  The wound was irrigated with saline solution.  We checked to be sure the acetabulum was clear.  We used the 36-mm outer diameter hip ball with a -2 neck length.  This was impacted in place. Hip was reduced, making sure there was no soft tissue into the joint and through a full range of motion may perfectly stable without toggling and again leg lengths appeared to be symmetrical.  We injected the capsule with 0.25% Marcaine without epinephrine.  Again checked to be sure the sciatic nerve was not exposed and well out of harm's way.  The capsule was then closed with running 0 Ethibond suture.  Short external rotators were closed with the same material.  IT band was closed with a running #1 Vicryl, subcu in several layers with Vicryl and Monocryl.  Skin was closed with skin clips.  Sterile bulky dressing was applied.  The patient was then placed supine without difficulty, extubated, and transferred to the operating room stretcher without difficulty and then returned to the postanesthesia recovery room without complication.     Vonna Kotyk. Durward Fortes, M.D.     PWW/MEDQ  D:  10/09/2016  T:  10/09/2016  Job:  [id number]

## 2016-10-10 NOTE — Op Note (Signed)
PATIENT ID: Jessica Hoover        MRN:  [id number]          DOB/AGE: 71/12/1945 / 71 y.o.    Norlene CampbellPeter Damieon Armendariz, MD   Jacqualine CodeBrian Petrarca, PA-C [address] [place], [place]  [id number]                             [phone]   PROGRESS NOTE  Subjective:  negative for Chest Pain  negative for Shortness of Breath  negative for Nausea/Vomiting   negative for Calf Pain    Tolerating Diet: yes         Patient reports pain as mild.     Comfortable, eating breakfast, no complaints  Objective: Vital signs in last 24 hours:   Patient Vitals for the past 24 hrs:  BP Temp Temp src Pulse Resp SpO2 Height Weight  10/10/16 0625 128/70 98.9 F (37.2 C) Oral 87 17 96 % - -  10/10/16 0140 136/69 98.9 F (37.2 C) Oral 85 16 97 % - -  10/09/16 2116 122/65 98.6 F (37 C) Oral 95 16 97 % - -  10/09/16 1500 (!) 144/78 97.8 F (36.6 C) Oral 86 14 98 % - -  10/09/16 1436 - - - 86 16 98 % - -  10/09/16 1430 (!) 147/65 (!) 96.9 F (36.1 C) - 81 12 98 % - -  10/09/16 1415 132/71 - - 83 (!) 9 100 % - -  10/09/16 1400 137/65 - - 77 10 100 % - -  10/09/16 1356 - - - 77 (!) 9 100 % - -  10/09/16 1354 - - - 81 10 100 % - -  10/09/16 1345 (!) 122/57 - - 82 12 100 % - -  10/09/16 1330 (!) 154/75 - - 81 11 100 % - -  10/09/16 1324 - - - 91 (!) 21 99 % - -  10/09/16 1315 (!) 166/78 - - 88 (!) 22 98 % - -  10/09/16 1300 (!) 154/76 97.7 F (36.5 C) - 83 16 99 % - -  10/09/16 0830 (!) 153/82 97.6 F (36.4 C) Oral 64 16 100 % 5\' 8"  (1.727 m) 175 lb (79.4 kg)      Intake/Output from previous day:   07/17 0701 - 07/18 0700 In: 1640 [P.O.:240; I.V.:1400] Out: 2850 [Urine:2550]   Intake/Output this shift:   No intake/output data recorded.   Intake/Output      07/17 0701 - 07/18 0700 07/18 0701 - 07/19 0700   P.O. 240    I.V. (mL/kg) 1400 (17.6)    Total Intake(mL/kg) 1640 (20.7)    Urine (mL/kg/hr) 2550    Blood 300    Total Output 2850     Net -1210          Emesis Occurrence 1 x       LABORATORY  DATA:  Recent Labs  10/10/16 0234  WBC 11.0*  HGB 11.0*  HCT 33.3*  PLT 307    Recent Labs  10/10/16 0234  NA 136  K 3.2*  CL 100*  CO2 22  BUN 12  CREATININE 1.08*  GLUCOSE 166*  CALCIUM 8.9   Lab Results  Component Value Date   INR 0.91 09/28/2016   INR 0.97 10/15/2014    Recent Radiographic Studies :  Dg Chest 2 View  Addendum Date: 10/01/2016   ADDENDUM REPORT: 10/01/2016 12:21 COMPARISON:  04/21/2015, chest radiograph  and 10/31/2014, chest CT Electronically Signed   By: Ted Mcalpine M.D.   On: 10/01/2016 12:21   Result Date: 10/01/2016 CLINICAL DATA:  Preoperative radiograph. EXAM: CHEST  2 VIEW COMPARISON:  04/20/2005 FINDINGS: Cardiomediastinal silhouette is normal. Mediastinal contours appear intact. Calcific atherosclerotic disease and tortuosity of the aorta. There is no evidence of focal airspace consolidation, pleural effusion or pneumothorax. Kyphosis of the thoracic spine. Multilevel osteoarthritic changes. and hyperdense appearance of the adjacent vertebral bodies in the lower thoracic spine. Soft tissues are grossly normal. IMPRESSION: No active cardiopulmonary disease. Calcific atherosclerotic disease of the aorta. Near complete effacement of the disc space and hyperdense appearance of the adjacent vertebral bodies in the lower thoracic spine, possibly T8/T9. This may represent asymmetric osteoarthritic changes, however discitis/ osteomyelitis cannot be excluded. Alternatively the hyperdense appearance may be due to overlying perispinal mass. Further evaluation with chest CT should be considered. These results will be called to the ordering clinician or representative by the Radiologist Assistant, and communication documented in the PACS or zVision Dashboard. Electronically Signed: By: Ted Mcalpine M.D. On: 09/28/2016 11:03   Ct Chest W Contrast  Result Date: 10/04/2016 CLINICAL DATA:  Abnormal chest radiograph demonstrating increased density over the  thoracic spine. EXAM: CT CHEST WITH CONTRAST TECHNIQUE: Multidetector CT imaging of the chest was performed during intravenous contrast administration. CONTRAST:  75mL ISOVUE-300 IOPAMIDOL (ISOVUE-300) INJECTION 61% COMPARISON:  Chest radiograph of 1 day prior.  Chest CT 10/31/2014. FINDINGS: Cardiovascular: Aortic and branch vessel atherosclerosis. Normal heart size, without pericardial effusion. Lad and probable left main coronary artery atherosclerosis. Mediastinum/Nodes: Probable sebaceous cyst about the right axilla. 10 mm on image 27/ series 2 versus 7 mm back in 2016. No mediastinal or hilar adenopathy. Lungs/Pleura: No pleural fluid. Mild thickening of the right major fissure. Mild centrilobular emphysema. Minimal subpleural right upper lobe nodularity on image 53/series 3 is similar to the prior CT and likely related to a subpleural lymph node. Subtle right upper lobe pulmonary nodule measures 4 mm on image 51/series 3 and is similar to 10/31/2014 (image 27/series 5 of that exam). This can be presumed benign. Upper Abdomen: Moderate to marked hepatic steatosis. Cholecystectomy. Normal imaged portions of the spleen, pancreas, stomach, kidneys. Bilateral adrenal thickening. Musculoskeletal: Loss of intervertebral disc height at T8-9 with surrounding endplate sclerosis. No osseous destruction or other suspicious findings. IMPRESSION: 1. 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 2.  No acute process in the chest. 3. Coronary artery atherosclerosis. Aortic Atherosclerosis (ICD10-I70.0). 4.  Emphysema (ICD10-J43.9). 5. Hepatic steatosis. 6. Stable pulmonary nodules since 10/31/2014. These can be presumed benign. 7. Probable sebaceous cyst about the right axilla. Enlarged since 2016. Consider physical exam correlation. Electronically Signed   By: Jeronimo Greaves M.D.   On: 10/04/2016 09:25   Dg Hip Port Unilat With Pelvis 1v Left  Result Date: 10/09/2016 CLINICAL DATA:  Post  LEFT total hip arthroplasty EXAM: DG HIP (WITH OR WITHOUT PELVIS) 1V PORT LEFT COMPARISON:  Portable exam at 1408 hrs compared to 09/19/2016 FINDINGS: BILATERAL total hip arthroplasties, new on LEFT. No acute fracture or dislocation. Overlying skin clips and soft tissue changes related to surgery. Scattered atherosclerotic calcifications. IMPRESSION: LEFT hip prosthesis without acute complication. Electronically Signed   By: Ulyses Southward M.D.   On: 10/09/2016 15:27   Xr Hip Unilat W Or W/o Pelvis 2-3 Views Left  Result Date: 09/19/2016 X-rays of the pelvis and left hip reveals bone-on-bone superior dome with irregularity of  the surfaces of both the acetabulum and the femoral head.  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 Marked chondral cystic changes are noted on both also. Periarticular spurs are noted.    Examination:  General appearance: alert, cooperative and no distress  Wound Exam: clean, dry, intact   Drainage:  None: wound tissue dry  Motor Exam: EHL, FHL, Anterior Tibial and Posterior Tibial Intact  Sensory Exam: Superficial Peroneal, Deep Peroneal and Tibial normal  Vascular Exam: Normal  Assessment:    1 Day Post-Op  Procedure(s) (LRB): LEFT TOTAL HIP ARTHROPLASTY (Left)  ADDITIONAL DIAGNOSIS:  Active Problems:   S/P hip replacement, left  Hypokalemia   Plan: Physical Therapy as ordered Weight Bearing as Tolerated (WBAT)  DVT Prophylaxis:  Xarelto, Foot Pumps and TED hose  DISCHARGE PLAN: Home  DISCHARGE NEEDS: HHPT, Walker and 3-in-1 comode seat Will give K supplement, OOB with PT, saline lock IV       Valeria Batman  10/10/2016 7:39 AM

## 2016-10-10 NOTE — Progress Notes (Signed)
Physical Therapy Treatment Patient Details Name: Jessica Hoover MRN: [id number] DOB: 09/30/1945 Today's Date: 10/10/2016    History of Present Illness Patient is a 71 y/o female presents s/p left THA, posterior approach. PMH includes R THA, chronic shoulder, pelvic, back and sciatic pain, HTN, heart murmur.    PT Comments    Upon arrival pt eager to ambulate and transfers quickly OOB despite PT VCs to wait for assistance. Pt ambulated entire unit with RW and able to maintain posterior hip precautions during transfers and gait after education. Pt tolerated standing exercises well and reported no fatigue or increased pain. Pt did not have knee immobilizer on when getting OOB this tx. Will address use of knee immobilizer next session, review posterior hip precautions, and attempt stair navigation and progress to ambulating with cane. Will continue to follow for mobilization and safety education for safe d/c home.    Follow Up Recommendations  DC plan and follow up therapy as arranged by surgeon;Supervision for mobility/OOB     Equipment Recommendations  None recommended by PT    Recommendations for Other Services       Precautions / Restrictions Precautions Precautions: Posterior Hip;Fall Precaution Comments: Reviewed precautions and handout given  Required Braces or Orthoses: Knee Immobilizer - Left Knee Immobilizer - Left: On at all times Restrictions Weight Bearing Restrictions: Yes LLE Weight Bearing: Weight bearing as tolerated    Mobility  Bed Mobility Overal bed mobility: Modified Independent Bed Mobility: Supine to Sit;Sit to Supine     Supine to sit: Modified independent (Device/Increase time) Sit to supine: Modified independent (Device/Increase time)   General bed mobility comments: Mod I with use of bed rails and increased time; no physical assist   Transfers Overall transfer level: Needs assistance   Transfers: Sit to/from Stand Sit to Stand: Min guard         General transfer comment: Min guard for safety and max VCs for slowing down movements and addressing impulsivity   Ambulation/Gait Ambulation/Gait assistance: Min guard Ambulation Distance (Feet): 500 Feet Assistive device: Rolling walker (2 wheeled) Gait Pattern/deviations: Step-through pattern;Decreased stance time - left;Decreased dorsiflexion - left     General Gait Details: Min guard for safety and VCs for safety and increased DF of lt foot. Pt able to maintain posterior hip precautions during ambulation with use of RW.    Stairs            Wheelchair Mobility    Modified Rankin (Stroke Patients Only)       Balance Overall balance assessment: Needs assistance Sitting-balance support: No upper extremity supported;Feet unsupported Sitting balance-Leahy Scale: Good Sitting balance - Comments: Able to sit EOB unsupported.    Standing balance support: Bilateral upper extremity supported Standing balance-Leahy Scale: Fair Standing balance comment: Bil UE support during ambulation; however not reliant on RW and able to ambulate well with no instability or LOB. Balance stable during gait and pt not really using RW.                             Cognition Arousal/Alertness: Awake/alert Behavior During Therapy: Impulsive Overall Cognitive Status: Within Functional Limits for tasks assessed                                 General Comments: Seems WNL for tasks; however pt impulsive with transfers, quick to move  Exercises      General Comments        Pertinent Vitals/Pain Pain Assessment: 0-10 Pain Score: 2  Pain Location: left hip Pain Descriptors / Indicators: Tightness Pain Intervention(s): Monitored during session    Home Living                      Prior Function            PT Goals (current goals can now be found in the care plan section) Acute Rehab PT Goals Patient Stated Goal: go home PT Goal Formulation:  With patient Time For Goal Achievement: 10/24/16 Potential to Achieve Goals: Good Progress towards PT goals: Progressing toward goals    Frequency    7X/week      PT Plan Current plan remains appropriate    Co-evaluation              AM-PAC PT "6 Clicks" Daily Activity  Outcome Measure  Difficulty turning over in bed (including adjusting bedclothes, sheets and blankets)?: None Difficulty moving from lying on back to sitting on the side of the bed? : Total Difficulty sitting down on and standing up from a chair with arms (e.g., wheelchair, bedside commode, etc,.)?: Total Help needed moving to and from a bed to chair (including a wheelchair)?: A Little Help needed walking in hospital room?: A Little Help needed climbing 3-5 steps with a railing? : A Little 6 Click Score: 15    End of Session   Activity Tolerance: Patient tolerated treatment well;No increased pain Patient left: in bed;with call bell/phone within reach;with bed alarm set Nurse Communication: Mobility status PT Visit Diagnosis: Pain;Difficulty in walking, not elsewhere classified (R26.2) Pain - Right/Left: Left Pain - part of body: Hip     Time: 1610-9604 PT Time Calculation (min) (ACUTE ONLY): 23 min  Charges:  $Gait Training: 8-22 mins $Therapeutic Exercise: 8-22 mins                    G Codes:       Arneta Cliche, SPT Acute Rehab [phone]    Arneta Cliche 10/10/2016, 12:22 PM

## 2016-10-11 LAB — BASIC METABOLIC PANEL
Anion gap: 8 (ref 5–15)
BUN: 11 mg/dL (ref 6–20)
CALCIUM: 8.6 mg/dL — AB (ref 8.9–10.3)
CO2: 26 mmol/L (ref 22–32)
Chloride: 106 mmol/L (ref 101–111)
Creatinine, Ser: 0.96 mg/dL (ref 0.44–1.00)
GFR calc Af Amer: >60 mL/min (ref >60)
GFR, EST NON AFRICAN AMERICAN: 58 mL/min — AB (ref >60)
GLUCOSE: 111 mg/dL — AB (ref 65–99)
Potassium: 3.3 mmol/L — ABNORMAL LOW (ref 3.5–5.1)
Sodium: 140 mmol/L (ref 135–145)

## 2016-10-11 LAB — CBC
HCT: 30 % — ABNORMAL LOW (ref 36.0–46.0)
Hemoglobin: 9.7 g/dL — ABNORMAL LOW (ref 12.0–15.0)
MCH: 27.9 pg (ref 26.0–34.0)
MCHC: 32.3 g/dL (ref 30.0–36.0)
MCV: 86.2 fL (ref 78.0–100.0)
PLATELETS: 267 10*3/uL (ref 150–400)
RBC: 3.48 MIL/uL — ABNORMAL LOW (ref 3.87–5.11)
RDW: 13.8 % (ref 11.5–15.5)
WBC: 7.5 10*3/uL (ref 4.0–10.5)

## 2016-10-11 LAB — GLUCOSE, CAPILLARY: Glucose-Capillary: 108 mg/dL — ABNORMAL HIGH (ref 65–99)

## 2016-10-11 MED ORDER — HYDROMORPHONE HCL 2 MG PO TABS: 2.0000 mg | ORAL_TABLET | ORAL | 0 refills | Status: DC | PRN

## 2016-10-11 MED ORDER — RIVAROXABAN 10 MG PO TABS: 10.0000 mg | ORAL_TABLET | Freq: Every day | ORAL | 0 refills | Status: DC

## 2016-10-11 MED ORDER — METHOCARBAMOL 500 MG PO TABS: 500.0000 mg | ORAL_TABLET | Freq: Three times a day (TID) | ORAL | 0 refills | Status: DC | PRN

## 2016-10-11 NOTE — Progress Notes (Signed)
Physical Therapy Treatment Patient Details Name: Jessica Hoover MRN: [id number] DOB: [birth year] Today's Date: 10/11/2016    History of Present Illness Patient is a 71 y/o female presents s/p left THA, posterior approach. PMH includes R THA, chronic shoulder, pelvic, back and sciatic pain, HTN, heart murmur.    PT Comments    Pt tolerated Tx well, progressing towards goals. Pt denies pain, increase in gait distance. Pt had trouble remembering the posterior hip precautions, PTA reviewed with pt and had pt recite them. Pt still impulsive and requires cues for transfers and gait training. Next Tx to review posterior hip precautions, gait train and advance exercises to standing.   Follow Up Recommendations  DC plan and follow up therapy as arranged by surgeon;Supervision for mobility/OOB     Equipment Recommendations  None recommended by PT    Recommendations for Other Services OT consult     Precautions / Restrictions Precautions Precautions: Posterior Hip;Fall Precaution Booklet Issued: Yes (comment) Precaution Comments: Reviewed precautions and went over hand out Restrictions Weight Bearing Restrictions: Yes LLE Weight Bearing: Weight bearing as tolerated    Mobility  Bed Mobility Overal bed mobility: Modified Independent Bed Mobility: Supine to Sit;Sit to Supine     Supine to sit: Modified independent (Device/Increase time) Sit to supine: Modified independent (Device/Increase time)   General bed mobility comments: Mod I with use of bed rails and increased time; no physical assist   Transfers Overall transfer level: Modified independent Equipment used: Straight cane Transfers: Sit to/from Stand Sit to Stand: Supervision         General transfer comment: Pt impulsive, told pt multiple times not to get up yet. Supervision for safety   Ambulation/Gait Ambulation/Gait assistance: Supervision Ambulation Distance (Feet): 510 Feet Assistive device: Straight cane Gait  Pattern/deviations: Step-through pattern;Trunk flexed;Decreased dorsiflexion - left Gait velocity: fast   General Gait Details: Supervision for safety, VC's for gait speed, heel strike/toe off, posture and to look straight ahead   Stairs            Wheelchair Mobility    Modified Rankin (Stroke Patients Only)       Balance Overall balance assessment: Needs assistance Sitting-balance support: No upper extremity supported;Feet unsupported Sitting balance-Leahy Scale: Good Sitting balance - Comments: Able to sit EOB unsupported.    Standing balance support: Single extremity supported;During functional activity Standing balance-Leahy Scale: Fair                              Cognition Arousal/Alertness: Awake/alert Behavior During Therapy: WFL for tasks assessed/performed Overall Cognitive Status: Within Functional Limits for tasks assessed                                        Exercises Total Joint Exercises Ankle Circles/Pumps: AROM;20 reps;Supine;Both Quad Sets: AROM;10 reps;Supine;Left Short Arc Quad: AROM;Supine;Left;10 reps Heel Slides: AROM;Left;10 reps;Supine (mini heel slides to abide by precautions ) Hip ABduction/ADduction: AROM;Left;10 reps;Supine    General Comments        Pertinent Vitals/Pain Pain Assessment: 0-10 Pain Score: 2  Pain Location: left hip Pain Descriptors / Indicators: Sore Pain Intervention(s): Monitored during session    Home Living                      Prior Function  PT Goals (current goals can now be found in the care plan section) Acute Rehab PT Goals Patient Stated Goal: go home PT Goal Formulation: With patient Potential to Achieve Goals: Good Progress towards PT goals: Progressing toward goals    Frequency    7X/week      PT Plan Current plan remains appropriate    Co-evaluation              AM-PAC PT "6 Clicks" Daily Activity  Outcome Measure   Difficulty turning over in bed (including adjusting bedclothes, sheets and blankets)?: None Difficulty moving from lying on back to sitting on the side of the bed? : A Little Difficulty sitting down on and standing up from a chair with arms (e.g., wheelchair, bedside commode, etc,.)?: A Little Help needed moving to and from a bed to chair (including a wheelchair)?: A Little Help needed walking in hospital room?: A Little Help needed climbing 3-5 steps with a railing? : A Lot 6 Click Score: 18    End of Session Equipment Utilized During Treatment: Gait belt Activity Tolerance: Patient tolerated treatment well;No increased pain Patient left: in bed;with call bell/phone within reach   PT Visit Diagnosis: Pain;Difficulty in walking, not elsewhere classified (R26.2) Pain - Right/Left: Left Pain - part of body: Hip     Time: 1610-9604 PT Time Calculation (min) (ACUTE ONLY): 22 min  Charges:  $Gait Training: 8-22 mins                    G Codes:       Cylas Falzone, SPTA [id number]    Tanasia Budzinski 10/11/2016, 2:28 PM

## 2016-10-11 NOTE — Progress Notes (Signed)
Subjective: 2 Days Post-Op Procedure(s) (LRB): LEFT TOTAL HIP ARTHROPLASTY (Left) Patient reports pain as mild.    Objective: Vital signs in last 24 hours: Temp:  [98.3 F (36.8 C)-98.6 F (37 C)] 98.6 F (37 C) (07/19 0457) Pulse Rate:  [80-93] 93 (07/19 0457) Resp:  [16-18] 18 (07/19 0457) BP: (132-135)/(65-90) 132/90 (07/19 0457) SpO2:  [97 %-98 %] 98 % (07/19 0457)  Intake/Output from previous day: 07/18 0701 - 07/19 0700 In: 720 [P.O.:720] Out: -  Intake/Output this shift: Total I/O In: 360 [P.O.:360] Out: -    Recent Labs  10/10/16 0234 10/11/16 0354  HGB 11.0* 9.7*    Recent Labs  10/10/16 0234 10/11/16 0354  WBC 11.0* 7.5  RBC 3.91 3.48*  HCT 33.3* 30.0*  PLT 307 267    Recent Labs  10/10/16 0234 10/11/16 0354  NA 136 140  K 3.2* 3.3*  CL 100* 106  CO2 22 26  BUN 12 11  CREATININE 1.08* 0.96  GLUCOSE 166* 111*  CALCIUM 8.9 8.6*   No results for input(s): LABPT, INR in the last 72 hours.  Neurovascular intact Sensation intact distally Intact pulses distally Dorsiflexion/Plantar flexion intact Incision: dressing C/D/I and no drainage  Assessment/Plan: 2 Days Post-Op Procedure(s) (LRB): LEFT TOTAL HIP ARTHROPLASTY (Left) Advance diet Up with therapy Discharge home with home health  Jacqualine CodeBrian Petrarca 10/11/2016, 1:36 PM

## 2016-10-11 NOTE — Plan of Care (Signed)
Problem: Safety: Goal: Ability to remain free from injury will improve Patient's belongings and call bell are within reach. Patient calls for assistance.

## 2016-10-11 NOTE — Care Management Note (Signed)
Case Management Note  Patient Details  Name: Dominic PeaOla S Pavelko MRN: [id number] Date of Birth: June 25, 1945  Subjective/Objective:   71 yr old female s/p left total hip arthroplasty.                 Action/Plan: Case manager spoke with patient and her family concerning discharge plan and DME. Patient says she already has equipment at home. Referral for Home Health was called to Janeice RobinsonKaren Nusbaumm, [facility] Liaison.Patient has great family support at discharge.    Expected Discharge Date:    10/11/16              Expected Discharge Plan:  Home w Home Health Services  In-House Referral:     Discharge planning Services  CM Consult  Post Acute Care Choice:  Home Health Choice offered to:  Patient  DME Arranged:  N/A (has RW and 3in1) DME Agency:  NA  HH Arranged:  PT HH Agency:  [facility]  Status of Service:  Completed, signed off  If discussed at Long Length of Stay Meetings, dates discussed:    Additional Comments:  Durenda GuthrieBrady, Stayce Delancy Naomi, RN 10/11/2016, 12:11 PM

## 2016-10-11 NOTE — Progress Notes (Signed)
Pt discharge education and instructions completed with pt and family at bedside. All voices understanding and denies any questions. Pt IV removed; surgical incision remains unremarkable after MD changed it. Pt pre-medicated with prn pain med prior to dc upon request. Pt handed her prescriptions for Dilaudid, Robaxin and Xarelto. Pt transported off unit via wheelchair with family and belongings to the side. Pt discharge home with family to transport her home. Dionne BucyP. Amo Amillion Macchia RN

## 2016-10-11 NOTE — Discharge Summary (Signed)
Norlene CampbellPeter Whitfield, MD   Jacqualine CodeBrian Joselyn Edling, PA-C [address], [place], [place]  [id number]                             [phone]  PATIENT ID: Jessica PeaOla S Hoover        MRN:  [id number]          DOB/AGE: March 05, 1946 / 71 y.o.    DISCHARGE SUMMARY  ADMISSION DATE:    10/09/2016 DISCHARGE DATE:   10/11/2016   ADMISSION DIAGNOSIS: LEFT HIP OSTEOARTHRITIS    DISCHARGE DIAGNOSIS:  LEFT HIP OSTEOARTHRITIS    ADDITIONAL DIAGNOSIS: Active Problems:   S/P hip replacement, left  Past Medical History:  Diagnosis Date  . Arthritis   . Chronic back pain   . Chronic pelvic pain in female    pelvic joint and thigh  . Chronic right hip pain   . Chronic shoulder pain   . Frequency of urination   . Heart murmur   . Hypertension   . Lumbago   . Neuralgia and neuritis   . Osteoarthritis   . Pneumonia    hx of  . PONV (postoperative nausea and vomiting)   . Radiculitis   . Sciatica   . Seizures (HCC)    had seizures as a child    PROCEDURE: Procedure(s): LEFT TOTAL HIP ARTHROPLASTY on 10/09/2016  CONSULTS: none    HISTORY: Jessica Hoover a 71 y.o. female  Who has a history of pain and functional disability in the left hip(s) due to arthritis and patient has failed non-surgical conservative treatments for greater than 12 weeks to include corticosteriod injections, flexibility and strengthening excercises, use of assistive devices and activity modification.  Onset of symptoms was gradual starting 2 years ago with rapidlly worsening course since that time.The patient noted no past surgery on the left hip(s).  Patient currently rates pain in the left hip at 7 out of 10 with activity. Patient has night pain, worsening of pain with activity and weight bearing, trendelenberg gait, pain that interfers with activities of daily living and pain with passive range of motion. Patient has evidence of subchondral cysts, subchondral sclerosis, periarticular osteophytes and joint space narrowing by imaging studies.  This condition presents safety issues increasing the risk of falls.This patient has had avascular necrosis of the hip, acetabular fracture, hip dysplasia.  There is no current active infection.  HOSPITAL COURSE:  Jessica Hoover is a 71 y.o. admitted on 10/09/2016 and found to have a diagnosis of LEFT HIP OSTEOARTHRITIS.  After appropriate laboratory studies were obtained  they were taken to the operating room on 10/09/2016 and underwent  Procedure(s): LEFT TOTAL HIP ARTHROPLASTY  .   They were given perioperative antibiotics:  Anti-infectives    Start     Dose/Rate Route Frequency Ordered Stop   10/09/16 2300  vancomycin (VANCOCIN) IVPB 1000 mg/200 mL premix     1,000 mg 200 mL/hr over 60 Minutes Intravenous Every 12 hours 10/09/16 1452 10/09/16 2300   10/09/16 0930  vancomycin (VANCOCIN) IVPB 1000 mg/200 mL premix     1,000 mg 200 mL/hr over 60 Minutes Intravenous To [facility] 10/08/16 1027 10/09/16 1204    .  Tolerated the procedure well.  Placed with a foley intraoperatively.    Toradol was given post op.  POD #1, allowed out of bed to a chair.  PT for ambulation and exercise program.  Foley D/C'd in morning.  IV saline  locked.  O2 discontionued.  POD #2, continued PT and ambulation.    The remainder of the hospital course was dedicated to ambulation and strengthening.   The patient was discharged on 2 Days Post-Op in  Stable condition.  Blood products given:none  DIAGNOSTIC STUDIES: Recent vital signs:  Patient Vitals for the past 24 hrs:  BP Temp Temp src Pulse Resp SpO2  10/11/16 0457 132/90 98.6 F (37 C) Oral 93 18 98 %  10/10/16 2320 135/72 98.3 F (36.8 C) Oral 86 18 97 %  10/10/16 1517 134/65 98.5 F (36.9 C) Oral 80 16 97 %       Recent laboratory studies:  Recent Labs  10/10/16 0234 10/11/16 0354  WBC 11.0* 7.5  HGB 11.0* 9.7*  HCT 33.3* 30.0*  PLT 307 267    Recent Labs  10/10/16 0234 10/11/16 0354  NA 136 140  K 3.2* 3.3*  CL 100* 106  CO2  22 26  BUN 12 11  CREATININE 1.08* 0.96  GLUCOSE 166* 111*  CALCIUM 8.9 8.6*   Lab Results  Component Value Date   INR 0.91 09/28/2016   INR 0.97 10/15/2014     Recent Radiographic Studies :  Dg Chest 2 View  Addendum Date: 10/01/2016   ADDENDUM REPORT: 10/01/2016 12:21 COMPARISON:  04/21/2015, chest radiograph and 10/31/2014, chest CT Electronically Signed   By: Ted Mcalpine M.D.   On: 10/01/2016 12:21   Result Date: 10/01/2016 CLINICAL DATA:  Preoperative radiograph. EXAM: CHEST  2 VIEW COMPARISON:  04/20/2005 FINDINGS: Cardiomediastinal silhouette is normal. Mediastinal contours appear intact. Calcific atherosclerotic disease and tortuosity of the aorta. There is no evidence of focal airspace consolidation, pleural effusion or pneumothorax. Kyphosis of the thoracic spine. Multilevel osteoarthritic changes. and hyperdense appearance of the adjacent vertebral bodies in the lower thoracic spine. Soft tissues are grossly normal. IMPRESSION: No active cardiopulmonary disease. Calcific atherosclerotic disease of the aorta. Near complete effacement of the disc space and hyperdense appearance of the adjacent vertebral bodies in the lower thoracic spine, possibly T8/T9. This may represent asymmetric osteoarthritic changes, however discitis/ osteomyelitis cannot be excluded. Alternatively the hyperdense appearance may be due to overlying perispinal mass. Further evaluation with chest CT should be considered. These results will be called to the ordering clinician or representative by the Radiologist Assistant, and communication documented in the PACS or zVision Dashboard. Electronically Signed: By: Ted Mcalpine M.D. On: 09/28/2016 11:03   Ct Chest W Contrast  Result Date: 10/04/2016 CLINICAL DATA:  Abnormal chest radiograph demonstrating increased density over the thoracic spine. EXAM: CT CHEST WITH CONTRAST TECHNIQUE: Multidetector CT imaging of the chest was performed during intravenous  contrast administration. CONTRAST:  75mL ISOVUE-300 IOPAMIDOL (ISOVUE-300) INJECTION 61% COMPARISON:  Chest radiograph of 1 day prior.  Chest CT 10/31/2014. FINDINGS: Cardiovascular: Aortic and branch vessel atherosclerosis. Normal heart size, without pericardial effusion. Lad and probable left main coronary artery atherosclerosis. Mediastinum/Nodes: Probable sebaceous cyst about the right axilla. 10 mm on image 27/ series 2 versus 7 mm back in 2016. No mediastinal or hilar adenopathy. Lungs/Pleura: No pleural fluid. Mild thickening of the right major fissure. Mild centrilobular emphysema. Minimal subpleural right upper lobe nodularity on image 53/series 3 is similar to the prior CT and likely related to a subpleural lymph node. Subtle right upper lobe pulmonary nodule measures 4 mm on image 51/series 3 and is similar to 10/31/2014 (image 27/series 5 of that exam). This can be presumed benign. Upper Abdomen: Moderate to marked hepatic steatosis. Cholecystectomy.  Normal imaged portions of the spleen, pancreas, stomach, kidneys. Bilateral adrenal thickening. Musculoskeletal: Loss of intervertebral disc height at T8-9 with surrounding endplate sclerosis. No osseous destruction or other suspicious findings. IMPRESSION: 1. 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 2.  No acute process in the chest. 3. Coronary artery atherosclerosis. Aortic Atherosclerosis (ICD10-I70.0). 4.  Emphysema (ICD10-J43.9). 5. Hepatic steatosis. 6. Stable pulmonary nodules since 10/31/2014. These can be presumed benign. 7. Probable sebaceous cyst about the right axilla. Enlarged since 2016. Consider physical exam correlation. Electronically Signed   By: Jeronimo Greaves M.D.   On: 10/04/2016 09:25   Dg Hip Port Unilat With Pelvis 1v Left  Result Date: 10/09/2016 CLINICAL DATA:  Post LEFT total hip arthroplasty EXAM: DG HIP (WITH OR WITHOUT PELVIS) 1V PORT LEFT COMPARISON:  Portable exam at 1408 hrs compared  to 09/19/2016 FINDINGS: BILATERAL total hip arthroplasties, new on LEFT. No acute fracture or dislocation. Overlying skin clips and soft tissue changes related to surgery. Scattered atherosclerotic calcifications. IMPRESSION: LEFT hip prosthesis without acute complication. Electronically Signed   By: Ulyses Southward M.D.   On: 10/09/2016 15:27   Xr Hip Unilat W Or W/o Pelvis 2-3 Views Left  Result Date: 09/19/2016 X-rays of the pelvis and left hip reveals bone-on-bone superior dome with irregularity of the surfaces of both the acetabulum and the femoral head.  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 Marked chondral cystic changes are noted on both also. Periarticular spurs are noted.   DISCHARGE INSTRUCTIONS: Discharge Instructions    Call MD / Call 911    Complete by:  As directed    If you experience chest pain or shortness of breath, CALL 911 and be transported to the hospital emergency room.  If you develope a fever above 101 F, pus (white drainage) or increased drainage or redness at the wound, or calf pain, call your surgeon's office.   Change dressing    Complete by:  As directed    DO NOT CHANGE THE DRESSING   Constipation Prevention    Complete by:  As directed    Drink plenty of fluids.  Prune juice may be helpful.  You may use a stool softener, such as Colace (over the counter) 100 mg twice a day.  Use MiraLax (over the counter) for constipation as needed.   Diet general    Complete by:  As directed    Discharge instructions    Complete by:  As directed    INSTRUCTIONS AFTER JOINT REPLACEMENT   Remove items at home which could result in a fall. This includes throw rugs or furniture in walking pathways ICE to the affected joint every three hours while awake for 30 minutes at a time, for at least the first 3-5 days, and then as needed for pain and swelling.  Continue to use ice for pain and swelling. You may notice swelling that will progress down to the foot and  ankle.  This is normal after surgery.  Elevate your leg when you are not up walking on it.   Continue to use the breathing machine you got in the hospital (incentive spirometer) which will help keep your temperature down.  It is common for your temperature to cycle up and down following surgery, especially at night when you are not up moving around and exerting yourself.  The breathing machine keeps your lungs expanded and your temperature down.   DIET:  As you were doing prior  to hospitalization, we recommend a well-balanced diet.  DRESSING / WOUND CARE / SHOWERING  Keep the surgical dressing until follow up.  The dressing is water proof, so you can shower without any extra covering.  IF THE DRESSING FALLS OFF or the wound gets wet inside, change the dressing with sterile gauze.  Please use good hand washing techniques before changing the dressing.  Do not use any lotions or creams on the incision until instructed by your surgeon.    ACTIVITY  Increase activity slowly as tolerated, but follow the weight bearing instructions below.   No driving for 6 weeks or until further direction given by your physician.  You cannot drive while taking narcotics.  No lifting or carrying greater than 10 lbs. until further directed by your surgeon. Avoid periods of inactivity such as sitting longer than an hour when not asleep. This helps prevent blood clots.  You may return to work once you are authorized by your doctor.     WEIGHT BEARING   Weight bearing as tolerated with assist device (walker, cane, etc) as directed, use it as long as suggested by your surgeon or therapist, typically at least 4-6 weeks.   EXERCISES  Results after joint replacement surgery are often greatly improved when you follow the exercise, range of motion and muscle strengthening exercises prescribed by your doctor. Safety measures are also important to protect the joint from further injury. Any time any of these exercises cause  you to have increased pain or swelling, decrease what you are doing until you are comfortable again and then slowly increase them. If you have problems or questions, call your caregiver or physical therapist for advice.   Rehabilitation is important following a joint replacement. After just a few days of immobilization, the muscles of the leg can become weakened and shrink (atrophy).  These exercises are designed to build up the tone and strength of the thigh and leg muscles and to improve motion. Often times heat used for twenty to thirty minutes before working out will loosen up your tissues and help with improving the range of motion but do not use heat for the first two weeks following surgery (sometimes heat can increase post-operative swelling).   These exercises can be done on a training (exercise) mat, on a table or on a bed. Use whatever works the best and is most comfortable for you.    Use music or television while you are exercising so that the exercises are a pleasant break in your day. This will make your life better with the exercises acting as a break in your routine that you can look forward to.   Perform all exercises about fifteen times, three times per day or as directed.  You should exercise both the operative leg and the other leg as well.   Exercises include:  Quad Sets - Tighten up the muscle on the front of the thigh (Quad) and hold for 5-10 seconds.   Straight Leg Raises - With your knee straight (if you were given a brace, keep it on), lift the leg to 60 degrees, hold for 3 seconds, and slowly lower the leg.  Perform this exercise against resistance later as your leg gets stronger.  Leg Slides: Lying on your back, slowly slide your foot toward your buttocks, bending your knee up off the floor (only go as far as is comfortable). Then slowly slide your foot back down until your leg is flat on the floor again.  Angel Wings:  Lying on your back spread your legs to the side as far  apart as you can without causing discomfort.  Hamstring Strength:  Lying on your back, push your heel against the floor with your leg straight by tightening up the muscles of your buttocks.  Repeat, but this time bend your knee to a comfortable angle, and push your heel against the floor.  You may put a pillow under the heel to make it more comfortable if necessary.   A rehabilitation program following joint replacement surgery can speed recovery and prevent re-injury in the future due to weakened muscles. Contact your doctor or a physical therapist for more information on knee rehabilitation.    CONSTIPATION  Constipation is defined medically as fewer than three stools per week and severe constipation as less than one stool per week.  Even if you have a regular bowel pattern at home, your normal regimen is likely to be disrupted due to multiple reasons following surgery.  Combination of anesthesia, postoperative narcotics, change in appetite and fluid intake all can affect your bowels.   YOU MUST use at least one of the following options; they are listed in order of increasing strength to get the job done.  They are all available over the counter, and you may need to use some, POSSIBLY even all of these options:    Drink plenty of fluids (prune juice may be helpful) and high fiber foods Colace 100 mg by mouth twice a day  Senokot for constipation as directed and as needed Dulcolax (bisacodyl), take with full glass of water  Miralax (polyethylene glycol) once or twice a day as needed.  If you have tried all these things and are unable to have a bowel movement in the first 3-4 days after surgery call either your surgeon or your primary doctor.    If you experience loose stools or diarrhea, hold the medications until you stool forms back up.  If your symptoms do not get better within 1 week or if they get worse, check with your doctor.  If you experience "the worst abdominal pain ever" or develop  nausea or vomiting, please contact the office immediately for further recommendations for treatment.   ITCHING:  If you experience itching with your medications, try taking only a single pain pill, or even half a pain pill at a time.  You can also use Benadryl over the counter for itching or also to help with sleep.   TED HOSE STOCKINGS:  Use stockings on both legs until for at least 2 weeks or as directed by physician office. They may be removed at night for sleeping.  MEDICATIONS:  See your medication summary on the "After Visit Summary" that nursing will review with you.  You may have some home medications which will be placed on hold until you complete the course of blood thinner medication.  It is important for you to complete the blood thinner medication as prescribed.  PRECAUTIONS:  If you experience chest pain or shortness of breath - call 911 immediately for transfer to the hospital emergency department.   If you develop a fever greater that 101 F, purulent drainage from wound, increased redness or drainage from wound, foul odor from the wound/dressing, or calf pain - CONTACT YOUR SURGEON.  FOLLOW-UP APPOINTMENTS:  If you do not already have a post-op appointment, please call the office for an appointment to be seen by your surgeon.  Guidelines for how soon to be seen are listed in your "After Visit Summary", but are typically between 1-4 weeks after surgery.  OTHER INSTRUCTIONS:   Knee Replacement:  Do not place pillow under knee, focus on keeping the knee straight while resting. CPM instructions: 0-90 degrees, 2 hours in the morning, 2 hours in the afternoon, and 2 hours in the evening. Place foam block, curve side up under heel at all times except when in CPM or when walking.  DO NOT modify, tear, cut, or change the foam block in any way.  MAKE SURE YOU:  Understand these instructions.  Get help right away if you are not doing well or  get worse.    Thank you for letting us be a part of your medical care team.  It is a privilege we respect greatly.  We hope these instructions will help you stay on track for a fast and full recovery!   Driving restrictions    Complete by:  As directed    No driving for 6 weeks   Follow the hip precautions as taught in Physical Therapy    Complete by:  As directed    Increase activity slowly as tolerated    Complete by:  As directed    Lifting restrictions    Complete by:  As directed    No lifting for 6 weeks   Patient may shower    Complete by:  As directed    You may shower over the brown dressing   TED hose    Complete by:  As directed    Use stockings (TED hose) for 3 weeks on left leg.  You may remove them at night for sleeping.   Weight bearing as tolerated    Complete by:  As directed    Laterality:  left   Extremity:  Lower      DISCHARGE MEDICATIONS:   Allergies as of 10/11/2016      Reactions   Clarithromycin Shortness Of Breath   Codeine Shortness Of Breath   Hydrocodone Shortness Of Breath   Penicillins Shortness Of Breath, Other (See Comments)   PATIENT HAS HAD A PCN REACTION WITH IMMEDIATE RASH, FACIAL/TONGUE/THROAT SWELLING, SOB, OR LIGHTHEADEDNESS WITH HYPOTENSION:  #  #  #  YES  #  #  #   Has patient had a PCN reaction causing severe rash involving mucus membranes or skin necrosis: No Has patient had a PCN reaction that required hospitalization: No Has patient had a PCN reaction occurring within the last 10 years: No   Sulfa Antibiotics Shortness Of Breath   Dilaudid [hydromorphone Hcl] Other (See Comments)   ORAL SORES   Oxycodone Hcl Nausea Only      Medication List    TAKE these medications   amLODipine 10 MG tablet Commonly known as:  NORVASC Take 10 mg by mouth daily.   HYDROmorphone 2 MG tablet Commonly known as:  DILAUDID Take 1 tablet (2 mg total) by mouth every 4 (four) hours as needed for severe pain (1 - 2 TABLETS Q 4H PRN PAIN).     methocarbamol 500 MG tablet Commonly known as:  ROBAXIN Take 1 tablet (500 mg total) by mouth every 8 (eight) hours as needed for muscle spasms.   rivaroxaban 10 MG Tabs tablet Commonly known as:  XARELTO Take 1 tablet (10  mg total) by mouth daily with breakfast.            Durable Medical Equipment        Start     Ordered   10/09/16 1453  DME Walker rolling  Once    Question:  Patient needs a walker to treat with the following condition  Answer:  S/P total hip arthroplasty   10/09/16 1452   10/09/16 1453  DME 3 n 1  Once     10/09/16 1452   10/09/16 1453  DME Bedside commode  Once    Question:  Patient needs a bedside commode to treat with the following condition  Answer:  S/P total hip arthroplasty   10/09/16 1452      FOLLOW UP VISIT:   Follow-up Information    [facility] Follow up.   Why:  A representative from [facility] will contact you to arrange start date and time for your therapy. Contact information: [address] [place] [address] [phone]        Valeria Batman, MD. Schedule an appointment as soon as possible for a visit on 10/22/2016.   Specialty:  Orthopedic Surgery Contact information: [address] [place] [address] [phone]           DISPOSITION:   Home  CONDITION:  Stable   Oris Drone. Aleda Grana [facility] [phone]  10/11/2016 1:47 PM

## 2016-10-12 ENCOUNTER — Telehealth (INDEPENDENT_AMBULATORY_CARE_PROVIDER_SITE_OTHER): Payer: Self-pay

## 2016-10-12 NOTE — Telephone Encounter (Signed)
Spoke with Jerelene ReddenLaura Carter, pts caregiver. She left a VM for me to call back. Pt is bleeding from her vagina. I called Dr. Cleophas DunkerWhitfield and he wanted me to tell her to stop the xarelto, take 325mg  aspirin 2 x day and call PCP

## 2016-10-16 ENCOUNTER — Telehealth (INDEPENDENT_AMBULATORY_CARE_PROVIDER_SITE_OTHER): Payer: Self-pay | Admitting: Orthopaedic Surgery

## 2016-10-16 NOTE — Telephone Encounter (Signed)
Telephone conversation with all in regards to the message received about her dizziness, lightheadedness and nausea. She is also complaining of fact that her hip is bothering her and apparently in the hospital there was an episode where she had and taken to the bathroom and they left the knee immobilizer on. As she was trying to sit down and she actually had fallen a little bit and she states it sure her hip hurt her. She has been taking the Dilaudid which apparently is causing her significant nausea. Previous message because she was having some bleeding vaginally, Dr. Cleophas DunkerWhitfield took her off the Xarelto and placed her just on aspirin. That is what she is on now.  I have talked to her in detail and asked her to please be seen here in the office. She states that she cannot find anybody to really bring her to the office. I also suggested that the use of an ambulance would be indicated and she couldn't be taken to the emergency room and was a physician's her myself over and take a look at her at that time also. She says she will consider that.

## 2016-10-16 NOTE — Telephone Encounter (Signed)
Patient is experiencing dizziness, lightheadedness, and nauseous. Hip pain is between 8-10. Patient getting a slight headache. Patient wondering if this is related to the apin meds. Patient also thinks she passed out last night. Patient states she fell in hospital, and may have hurt her hip, but that staff seemed to brush her off. Please call patient to advise. Patient taking a medicine Xareltl that is a level 2 interaction  with ASA.

## 2016-10-18 ENCOUNTER — Emergency Department (HOSPITAL_COMMUNITY): Payer: Medicare HMO

## 2016-10-18 ENCOUNTER — Encounter (HOSPITAL_COMMUNITY): Payer: Self-pay | Admitting: Adult Health

## 2016-10-18 ENCOUNTER — Emergency Department (HOSPITAL_COMMUNITY)
Admission: EM | Admit: 2016-10-18 | Discharge: 2016-10-18 | Disposition: A | Discharge status: Home / Self Care | Payer: Medicare HMO | Attending: Emergency Medicine | Admitting: Emergency Medicine

## 2016-10-18 DIAGNOSIS — Z79899 Other long term (current) drug therapy: Secondary | ICD-10-CM | POA: Insufficient documentation

## 2016-10-18 DIAGNOSIS — G8918 Other acute postprocedural pain: Secondary | ICD-10-CM | POA: Diagnosis not present

## 2016-10-18 DIAGNOSIS — I10 Essential (primary) hypertension: Secondary | ICD-10-CM | POA: Insufficient documentation

## 2016-10-18 DIAGNOSIS — R112 Nausea with vomiting, unspecified: Secondary | ICD-10-CM | POA: Diagnosis not present

## 2016-10-18 DIAGNOSIS — F17210 Nicotine dependence, cigarettes, uncomplicated: Secondary | ICD-10-CM | POA: Diagnosis not present

## 2016-10-18 DIAGNOSIS — E860 Dehydration: Secondary | ICD-10-CM | POA: Insufficient documentation

## 2016-10-18 DIAGNOSIS — R52 Pain, unspecified: Secondary | ICD-10-CM

## 2016-10-18 LAB — URINALYSIS, ROUTINE W REFLEX MICROSCOPIC
Bilirubin Urine: NEGATIVE
GLUCOSE, UA: NEGATIVE mg/dL
Hgb urine dipstick: NEGATIVE
Ketones, ur: NEGATIVE mg/dL
LEUKOCYTES UA: NEGATIVE
Nitrite: NEGATIVE
PH: 6 (ref 5.0–8.0)
PROTEIN: NEGATIVE mg/dL
SPECIFIC GRAVITY, URINE: 1.014 (ref 1.005–1.030)

## 2016-10-18 LAB — COMPREHENSIVE METABOLIC PANEL
ALBUMIN: 3.3 g/dL — AB (ref 3.5–5.0)
ALT: 39 U/L (ref 14–54)
ANION GAP: 6 (ref 5–15)
AST: 22 U/L (ref 15–41)
Alkaline Phosphatase: 116 U/L (ref 38–126)
BILIRUBIN TOTAL: 0.4 mg/dL (ref 0.3–1.2)
BUN: 11 mg/dL (ref 6–20)
CHLORIDE: 103 mmol/L (ref 101–111)
CO2: 28 mmol/L (ref 22–32)
Calcium: 9.1 mg/dL (ref 8.9–10.3)
Creatinine, Ser: 1.08 mg/dL — ABNORMAL HIGH (ref 0.44–1.00)
GFR calc Af Amer: 58 mL/min — ABNORMAL LOW (ref >60)
GFR, EST NON AFRICAN AMERICAN: 50 mL/min — AB (ref >60)
GLUCOSE: 102 mg/dL — AB (ref 65–99)
POTASSIUM: 3.2 mmol/L — AB (ref 3.5–5.1)
Sodium: 137 mmol/L (ref 135–145)
TOTAL PROTEIN: 6.1 g/dL — AB (ref 6.5–8.1)

## 2016-10-18 LAB — CBC WITH DIFFERENTIAL/PLATELET
BASOS ABS: 0 10*3/uL (ref 0.0–0.1)
Basophils Relative: 1 %
Eosinophils Absolute: 0.4 10*3/uL (ref 0.0–0.7)
Eosinophils Relative: 4 %
HEMATOCRIT: 30.9 % — AB (ref 36.0–46.0)
Hemoglobin: 10 g/dL — ABNORMAL LOW (ref 12.0–15.0)
LYMPHS ABS: 2 10*3/uL (ref 0.7–4.0)
Lymphocytes Relative: 24 %
MCH: 28.2 pg (ref 26.0–34.0)
MCHC: 32.4 g/dL (ref 30.0–36.0)
MCV: 87 fL (ref 78.0–100.0)
Monocytes Absolute: 0.3 10*3/uL (ref 0.1–1.0)
Monocytes Relative: 4 %
NEUTROS ABS: 5.5 10*3/uL (ref 1.7–7.7)
Neutrophils Relative %: 67 %
Platelets: 484 10*3/uL — ABNORMAL HIGH (ref 150–400)
RBC: 3.55 MIL/uL — AB (ref 3.87–5.11)
RDW: 13.7 % (ref 11.5–15.5)
WBC: 8.2 10*3/uL (ref 4.0–10.5)

## 2016-10-18 MED ORDER — ONDANSETRON 4 MG PO TBDP: 4.0000 mg | ORAL_TABLET | Freq: Three times a day (TID) | ORAL | 0 refills | Status: DC | PRN

## 2016-10-18 MED ORDER — SODIUM CHLORIDE 0.9 % IV BOLUS (SEPSIS)
1000.0000 mL | Freq: Once | INTRAVENOUS | Status: AC
  Administered 2016-10-18: 1000 mL via INTRAVENOUS

## 2016-10-18 MED ORDER — OXYCODONE-ACETAMINOPHEN 5-325 MG PO TABS: 1.0000 | ORAL_TABLET | ORAL | 0 refills | Status: DC | PRN

## 2016-10-18 MED ORDER — ONDANSETRON HCL 4 MG/2ML IJ SOLN
4.0000 mg | Freq: Once | INTRAMUSCULAR | Status: AC
  Administered 2016-10-18: 4 mg via INTRAVENOUS
  Filled 2016-10-18: qty 2

## 2016-10-18 NOTE — ED Provider Notes (Signed)
[facility]-EMERGENCY DEPT Provider Note   CSN: [id number] Arrival date & time: 10/18/16  1123     History   Chief Complaint Chief Complaint  Patient presents with  . Post-op Problem    HPI Jessica Hoover is a 71 y.o. female.  Pt presents to the ED today with left hip pain and n/v.  The pt had a hip replacement to the left hip by Dr. Cleophas Dunker [facility]) on 7/17.  After d/c she went to rehab.  Pain was minimal.  While in rehab, she said she was dropped by a nurse helping her go to the bathroom.  She landed on her left hip.  She has had severe pain in the hip since then.  She has been taking dilaudid orally for the pain.  She has had n/v with the dilaudid, but said she has to take it due to the pain.  Pt was on Xarelto post-op, but that was stopped b/c she had some GI bleeding associated with it.      Past Medical History:  Diagnosis Date  . Arthritis   . Chronic back pain   . Chronic pelvic pain in female    pelvic joint and thigh  . Chronic right hip pain   . Chronic shoulder pain   . Frequency of urination   . Heart murmur   . Hypertension   . Lumbago   . Neuralgia and neuritis   . Osteoarthritis   . Pneumonia    hx of  . PONV (postoperative nausea and vomiting)   . Radiculitis   . Sciatica   . 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 S/P hip replacement, left 10/09/2016  . Essential hypertension 08/30/2016  . Primary osteoarthritis of right hip 10/28/2014  . Hypokalemia 10/28/2014  . Primary osteoarthritis of left hip 10/26/2014  . S/P total hip arthroplasty 10/26/2014    Past Surgical History:  Procedure Laterality Date  . ABDOMINAL HYSTERECTOMY     partial  . BACK SURGERY    . CHOLECYSTECTOMY    . COLONOSCOPY W/ POLYPECTOMY    . JOINT REPLACEMENT    . TOTAL HIP ARTHROPLASTY Right 10/26/2014   Procedure: TOTAL HIP ARTHROPLASTY;  Surgeon: Valeria Batman, MD;  Location[facility] OR;  Service: Orthopedics;   Laterality: Right;  . TOTAL HIP ARTHROPLASTY Left 10/09/2016  . TOTAL HIP ARTHROPLASTY Left 10/09/2016   Procedure: LEFT TOTAL HIP ARTHROPLASTY;  Surgeon: Valeria Batman, MD;  Location: [facility] OR;  Service: Orthopedics;  Laterality: Left;    OB History    No data available       Home Medications    Prior to Admission medications   Medication Sig Start Date End Date Taking? Authorizing Provider  amLODipine (NORVASC) 10 MG tablet Take 10 mg by mouth daily.    [provider]  HYDROmorphone (DILAUDID) 2 MG tablet Take 1 tablet (2 mg total) by mouth every 4 (four) hours as needed for severe pain (1 - 2 TABLETS Q 4H PRN PAIN). 10/11/16   Jetty Peeks, PA-C  methocarbamol (ROBAXIN) 500 MG tablet Take 1 tablet (500 mg total) by mouth every 8 (eight) hours as needed for muscle spasms. 10/11/16   Jetty Peeks, PA-C  ondansetron (ZOFRAN ODT) 4 MG disintegrating tablet Take 1 tablet (4 mg total) by mouth every 8 (eight) hours as needed. 10/18/16   Jacalyn Lefevre, MD  oxyCODONE-acetaminophen (PERCOCET/ROXICET) [phone]  MG tablet Take 1 tablet by mouth every 4 (four) hours as needed for severe pain. 10/18/16   Jacalyn LefevreHaviland, Tom Macpherson, MD  rivaroxaban (XARELTO) 10 MG TABS tablet Take 1 tablet (10 mg total) by mouth daily with breakfast. 10/12/16   Jetty PeeksPetrarca, Brian D, PA-C    Family History Family History  Problem Relation Age of Onset  . Hypertension Mother   . Depression Mother   . Hypertension Father   . Cancer Father     Social History Social History  Substance Use Topics  . Smoking status: Current Every Day Smoker    Packs/day: 0.25    Years: 56.00    Types: Cigarettes    Start date: 07/11/1965  . Smokeless tobacco: Never Used  . Alcohol use No     Allergies   Clarithromycin; Codeine; Hydrocodone; Penicillins; Sulfa antibiotics; Dilaudid [hydromorphone hcl]; and Oxycodone hcl   Review of Systems Review of Systems  Gastrointestinal: Positive for nausea and vomiting.    Musculoskeletal:       Left hip pain  All other systems reviewed and are negative.    Physical Exam Updated Vital Signs BP 134/65   Pulse 79   Temp 98.8 F (37.1 C) (Oral)   Resp 16   Ht 5\' 8"  (1.727 m)   Wt 79.4 kg (175 lb)   SpO2 99%   BMI 26.61 kg/m   Physical Exam  Constitutional: She is oriented to person, place, and time. She appears well-developed and well-nourished.  HENT:  Head: Normocephalic and atraumatic.  Right Ear: External ear normal.  Left Ear: External ear normal.  Nose: Nose normal.  Mouth/Throat: Mucous membranes are dry.  Eyes: Pupils are equal, round, and reactive to light. Conjunctivae and EOM are normal.  Neck: Normal range of motion. Neck supple.  Cardiovascular: Normal rate, regular rhythm, normal heart sounds and intact distal pulses.   Pulmonary/Chest: Effort normal and breath sounds normal.  Abdominal: Soft. Bowel sounds are normal.  Musculoskeletal: Normal range of motion.       Left hip: She exhibits tenderness.  Incision site looks good  Neurological: She is alert and oriented to person, place, and time.  Skin: Skin is warm.  Psychiatric: She has a normal mood and affect. Her behavior is normal. Judgment and thought content normal.  Nursing note and vitals reviewed.    ED Treatments / Results  Labs (all labs ordered are listed, but only abnormal results are displayed) Labs Reviewed  COMPREHENSIVE METABOLIC PANEL - Abnormal; Notable for the following:       Result Value   Potassium 3.2 (*)    Glucose, Bld 102 (*)    Creatinine, Ser 1.08 (*)    Total Protein 6.1 (*)    Albumin 3.3 (*)    GFR calc non Af Amer 50 (*)    GFR calc Af Amer 58 (*)    All other components within normal limits  CBC WITH DIFFERENTIAL/PLATELET - Abnormal; Notable for the following:    RBC 3.55 (*)    Hemoglobin 10.0 (*)    HCT 30.9 (*)    Platelets 484 (*)    All other components within normal limits  URINALYSIS, ROUTINE W REFLEX MICROSCOPIC -  Abnormal; Notable for the following:    APPearance HAZY (*)    All other components within normal limits    EKG  EKG Interpretation None       Radiology Ct Hip Left Wo Contrast  Result Date: 10/18/2016 CLINICAL DATA:  Left hip pain, recent  arthroplasty. EXAM: CT OF THE LEFT HIP WITHOUT CONTRAST TECHNIQUE: Multidetector CT imaging of the left hip was performed according to the standard protocol. Multiplanar CT image reconstructions were also generated. COMPARISON:  Pelvic x-rays from same day ; CT abdomen and pelvis dated January 24, 2014. FINDINGS: Bones/Joint/Cartilage Status post left total hip arthroplasty. The components are well aligned. There is no evidence of periprosthetic lucency or fracture. Lucencies within the anterior acetabulum may represent residual degenerative cystic change. No large joint effusion, although evaluation is somewhat limited due to streak artifact. Degenerative changes of the left sacroiliac joint and pubic symphysis. Ligaments Suboptimally assessed by CT. Soft tissues Postsurgical changes are noted within the soft tissues overlying the left lateral hip. There is a low-density subcutaneous fluid collection deep to the surgical staples measuring approximately 3.5 x 2.3 x 10.5 cm (AP by transverse by CC). The visualized intrapelvic contents demonstrate extensive sigmoid diverticulosis. Atherosclerotic vascular calcifications. IMPRESSION: 1. Postsurgical changes related to left total hip arthroplasty without evidence of hardware complication. 2. Low-density subcutaneous fluid collection deep to the surgical staples likely represents a postoperative seroma, although the sterility of this collection cannot be determined by CT. Electronically Signed   By: Obie DredgeWilliam T Derry M.D.   On: 10/18/2016 15:48   Dg Hip Unilat W Or Wo Pelvis 2-3 Views Left  Result Date: 10/18/2016 CLINICAL DATA:  Lateral LEFT hip pain after recent hip replacement EXAM: DG HIP (WITH OR WITHOUT PELVIS)  2-3V LEFT COMPARISON:  None. FINDINGS: LEFT total hip arthroplasty is redemonstrated. Satisfactory position and alignment. Overlying skin clips. No adjacent pelvic pathology. No adjacent fracture. Atherosclerotic disease. Status post RIGHT THA. IMPRESSION: No adverse features related to recent LEFT total hip arthroplasty. Electronically Signed   By: Elsie StainJohn T Curnes M.D.   On: 10/18/2016 13:19    Procedures Procedures (including critical care time)  Medications Ordered in ED Medications  sodium chloride 0.9 % bolus 1,000 mL (1,000 mLs Intravenous New Bag/Given 10/18/16 1210)  ondansetron (ZOFRAN) injection 4 mg (4 mg Intravenous Given 10/18/16 1208)     Initial Impression / Assessment and Plan / ED Course  I have reviewed the triage vital signs and the nursing notes.  Pertinent labs & imaging results that were available during my care of the patient were reviewed by me and considered in my medical decision making (see chart for details).    Pt is feeling much better.  I suspect n/v is from the oral dilaudid.  The pt was d/w Dr. Hoy RegisterWhitfield's PA who recommended a CT of her hip.  Pt also d/w Dr. Cleophas DunkerWhitfield who called even thought he was on vacation.  He is scheduled to see her on 8/1.    I will change her pain meds to percocet.  She is to stop hydromorphone.  She will also be d/c on zofran.  She knows to return if worse.  Final Clinical Impressions(s) / ED Diagnoses   Final diagnoses:  Pain  Dehydration  Non-intractable vomiting with nausea, unspecified vomiting type  Post-op pain    New Prescriptions New Prescriptions   ONDANSETRON (ZOFRAN ODT) 4 MG DISINTEGRATING TABLET    Take 1 tablet (4 mg total) by mouth every 8 (eight) hours as needed.   OXYCODONE-ACETAMINOPHEN (PERCOCET/ROXICET) 5-325 MG TABLET    Take 1 tablet by mouth every 4 (four) hours as needed for severe pain.     Jacalyn LefevreHaviland, Kaelea Gathright, MD 10/18/16 [phone]

## 2016-10-18 NOTE — Discharge Instructions (Signed)
Stop hydromorphone and start oxycodone.

## 2016-10-18 NOTE — ED Triage Notes (Addendum)
Presents post op from one week mago a left hip replacement-while at home having increased left hip pain, nausea, vomiting  And believes it is from the morphine she is taking for pain. Last dose of medication for pain is yesterday. Last emesis 8:30 yesterday evening. Pain is described as severe and not getting paiin relief. Dr. Cleophas DunkerWhitfield is her surgeon who sent her here and would like to be called at [place].

## 2016-10-24 ENCOUNTER — Ambulatory Visit (INDEPENDENT_AMBULATORY_CARE_PROVIDER_SITE_OTHER): Payer: Medicare HMO | Admitting: Orthopaedic Surgery

## 2016-10-24 ENCOUNTER — Encounter (INDEPENDENT_AMBULATORY_CARE_PROVIDER_SITE_OTHER): Payer: Self-pay | Admitting: Orthopaedic Surgery

## 2016-10-24 VITALS — BP 154/88 | HR 78 | Ht 66.0 in | Wt 174.0 lb

## 2016-10-24 DIAGNOSIS — Z96642 Presence of left artificial hip joint: Secondary | ICD-10-CM

## 2016-10-24 NOTE — Progress Notes (Signed)
Post-Op Visit Note   Patient: Jessica Hoover           Date of Birth: 25-Jan-1946           MRN: [id number] Visit Date: 10/24/2016 PCP: Smith RobertKikel, Stephen, MD   Assessment & Plan:  Chief Complaint:  Chief Complaint  Patient presents with  . Left Hip - Routine Post Op, Pain    Jessica Hoover is status post 2 weeks L total hip replacement. She is ambulating with a cane.     While in rehab, she said she was dropped by a nurse helping her go to the bathroom.  She landed on her left hip.  She has had severe pain in the hip since then   Visit Diagnoses:  1. Status post left hip replacement   Had an episode of vaginal bleeding the day after discharge which stopped after discontinuing the xarelto. Had an episode while in the hospital where she was forcibly lowered to the commode and with persistent pain and was seen in the emergency room last week x-rays and CT scan of her left hip reveal no evidence of abnormality. She had difficulty with the Dilaudid causing problems with mouth ulcers so she is now on oxycodone and comfortable she's taking 1 aspirin twice a day and using a cane. Neurovascular exam intact no swelling. Staples removed and Steri-Strips applied to the left hip  Plan: Continue with aspirin, continue with cane ambulation with weightbearing as tolerated, office 2 weeks. Prescription for oxycodone with Tylenol 05/325 #30 Follow-Up Instructions: Return in about 2 weeks (around 11/07/2016).   Orders:  No orders of the defined types were placed in this encounter.  No orders of the defined types were placed in this encounter.   Imaging: No results found.  PMFS History: Patient Active Problem List   Diagnosis Date Noted  . S/P hip replacement, left 10/09/2016  . Essential hypertension 08/30/2016  . Primary osteoarthritis of right hip 10/28/2014  . Hypokalemia 10/28/2014  . Primary osteoarthritis of left hip 10/26/2014  . S/P total hip arthroplasty 10/26/2014   Past Medical History:    Diagnosis Date  . Arthritis   . Chronic back pain   . Chronic pelvic pain in female    pelvic joint and thigh  . Chronic right hip pain   . Chronic shoulder pain   . Frequency of urination   . Heart murmur   . Hypertension   . Lumbago   . Neuralgia and neuritis   . Osteoarthritis   . Pneumonia    hx of  . PONV (postoperative nausea and vomiting)   . Radiculitis   . Sciatica   . 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 Hypertension Mother   . Depression Mother   . Hypertension Father   . Cancer Father     Past Surgical History:  Procedure Laterality Date  . ABDOMINAL HYSTERECTOMY     partial  . BACK SURGERY    . CHOLECYSTECTOMY    . COLONOSCOPY W/ POLYPECTOMY    . JOINT REPLACEMENT    . TOTAL HIP ARTHROPLASTY Right 10/26/2014   Procedure: TOTAL HIP ARTHROPLASTY;  Surgeon: Valeria BatmanPeter W Kaneesha Constantino, MD;  [facility];  Service: Orthopedics;  Laterality: Right;  . TOTAL HIP ARTHROPLASTY Left 10/09/2016  . TOTAL HIP ARTHROPLASTY Left 10/09/2016   Procedure: LEFT TOTAL HIP ARTHROPLASTY;  Surgeon: Valeria BatmanWhitfield, Jorah Hua W, MD;  Location:  [facility] OR;  Service: Orthopedics;  Laterality: Left;   Social History   Occupational History  . Not on file.   Social History Main Topics  . Smoking status: Current Every Day Smoker    Packs/day: 0.25    Years: 56.00    Types: Cigarettes    Start date: 07/11/1965  . Smokeless tobacco: Never Used  . Alcohol use No  . Drug use: No  . Sexual activity: Not Currently

## 2016-10-29 ENCOUNTER — Other Ambulatory Visit (INDEPENDENT_AMBULATORY_CARE_PROVIDER_SITE_OTHER): Payer: Self-pay | Admitting: Orthopaedic Surgery

## 2016-10-30 ENCOUNTER — Telehealth (INDEPENDENT_AMBULATORY_CARE_PROVIDER_SITE_OTHER): Payer: Self-pay | Admitting: Orthopaedic Surgery

## 2016-10-30 NOTE — Telephone Encounter (Signed)
Patient has had increased swelling at base of incision for past three days now. No pain, per patient. Please call to advise. GP saw it yesterday and questioned fluid on knee. Please call patient to advise.

## 2016-10-30 NOTE — Telephone Encounter (Signed)
Please advise 

## 2016-10-31 ENCOUNTER — Ambulatory Visit (INDEPENDENT_AMBULATORY_CARE_PROVIDER_SITE_OTHER): Payer: Medicare HMO | Admitting: Orthopedic Surgery

## 2016-10-31 ENCOUNTER — Encounter (INDEPENDENT_AMBULATORY_CARE_PROVIDER_SITE_OTHER): Payer: Self-pay | Admitting: Orthopaedic Surgery

## 2016-10-31 ENCOUNTER — Ambulatory Visit (INDEPENDENT_AMBULATORY_CARE_PROVIDER_SITE_OTHER): Payer: Medicare HMO | Admitting: Orthopaedic Surgery

## 2016-10-31 DIAGNOSIS — Z96642 Presence of left artificial hip joint: Secondary | ICD-10-CM

## 2016-10-31 NOTE — Telephone Encounter (Signed)
Will see today.  

## 2016-10-31 NOTE — Progress Notes (Signed)
Office Visit Note   Patient: Jessica Hoover           Date of Birth: 09-Jan-1946           MRN: [id number] Visit Date: 10/31/2016              Requested by: Jessica RobertKikel, Stephen, MD [address] [place], [place] [id number] PCP: Jessica RobertKikel, Stephen, MD   Assessment & Plan: Visit Diagnoses: 3 weeks status post primary left total hip replacement with probable small hematoma  beneath the incision Plan: Observation. No evidence of infection or drainage. Wound is healing otherwise without problem.  Follow-Up Instructions: No Follow-up on file.   Orders:  No orders of the defined types were placed in this encounter.  No orders of the defined types were placed in this encounter.     Procedures: No procedures performed   Clinical Data: No additional findings.   Subjective: Chief Complaint  Patient presents with  . Left Hip - Routine Post Op  Jessica Hoover is about 3 weeks post left total knee replacement. Within several days of discharge she had some vaginal bleeding which resolved with cessation of the xarelto. She fell this past week and landed on her left side x-rays and CT scan were negative for any fracture. She's had some "swelling" along her incision and she just had some concerns. She denies fever or chills shortness of breath or chest pain.   HPI  Review of Systems  All other systems reviewed and are negative.    Objective: Vital Signs: There were no vitals taken for this visit.  Physical Exam  Ortho Exam left hip incision appears to be healing without evidence of infection. There is no redness. There is no pain to palpation. Small probable hematoma beneath the incision which I think at this point is insignificant. Neurovascular exam intact no pain to range of motion of her hip. Specialty Comments:  No specialty comments available.  Imaging: No results found.   PMFS History: Patient Active Problem List   Diagnosis Date Noted  . S/P hip replacement, left 10/09/2016  .  Essential hypertension 08/30/2016  . Primary osteoarthritis of right hip 10/28/2014  . Hypokalemia 10/28/2014  . Primary osteoarthritis of left hip 10/26/2014  . S/P total hip arthroplasty 10/26/2014   Past Medical History:  Diagnosis Date  . Arthritis   . Chronic back pain   . Chronic pelvic pain in female    pelvic joint and thigh  . Chronic right hip pain   . Chronic shoulder pain   . Frequency of urination   . Heart murmur   . Hypertension   . Lumbago   . Neuralgia and neuritis   . Osteoarthritis   . Pneumonia    hx of  . PONV (postoperative nausea and vomiting)   . Radiculitis   . Sciatica   . 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 Hypertension Mother   . Depression Mother   . Hypertension Father   . Cancer Father     Past Surgical History:  Procedure Laterality Date  . ABDOMINAL HYSTERECTOMY     partial  . BACK SURGERY    . CHOLECYSTECTOMY    . COLONOSCOPY W/ POLYPECTOMY    . JOINT REPLACEMENT    . TOTAL HIP ARTHROPLASTY Right 10/26/2014   Procedure: TOTAL HIP ARTHROPLASTY;  Surgeon: Valeria BatmanPeter W Whitfield, MD;  Location: [facility] OR;  Service: Orthopedics;  Laterality: Right;  . TOTAL HIP ARTHROPLASTY Left 10/09/2016  . TOTAL HIP ARTHROPLASTY Left 10/09/2016   Procedure: LEFT TOTAL HIP ARTHROPLASTY;  Surgeon: Valeria Batman, MD;  Location: [facility] OR;  Service: Orthopedics;  Laterality: Left;   Social History   Occupational History  . Not on file.   Social History Main Topics  . Smoking status: Current Every Day Smoker    Packs/day: 0.25    Years: 56.00    Types: Cigarettes    Start date: 07/11/1965  . Smokeless tobacco: Never Used  . Alcohol use No  . Drug use: No  . Sexual activity: Not Currently

## 2016-11-07 ENCOUNTER — Ambulatory Visit (INDEPENDENT_AMBULATORY_CARE_PROVIDER_SITE_OTHER): Payer: Medicare HMO | Admitting: Orthopaedic Surgery

## 2016-11-07 DIAGNOSIS — Z96642 Presence of left artificial hip joint: Secondary | ICD-10-CM

## 2016-11-07 NOTE — Progress Notes (Signed)
Office Visit Note   Patient: Jessica Hoover           Date of Birth: Jul 25, 1945           MRN: [id number] Visit Date: 11/07/2016              Requested by: [name], MD [address] [place], [place] [address] PCP: [name], MD   Assessment & Plan: Visit Diagnoses:  1. Status post left hip replacement   Stopper leads developed some vaginal bleeding which has subsequently resolved. She also had what may have been minimal hematoma from in inferior aspect of her incision. This drained on one occasion and has not since. No present problem  Plan: Weightbearing as tolerated. Continue with exercises. Office 2 months. She's doing very well without any problems at this point  Follow-Up Instructions: Return in about 2 months (around 01/07/2017).   Orders:  No orders of the defined types were placed in this encounter.  No orders of the defined types were placed in this encounter.     Procedures: No procedures performed   Clinical Data: No additional findings.   Subjective: Chief Complaint  Patient presents with  . Left Hip - Routine Post Op    Pt is 1 month status post left hip replacement. She relates she is doing well.  Denies fever or chills shortness of breath or chest pain. Noted edema to either lower extremity. Just "a little sore" about her left hip. Preop pain has resolved.  HPI  Review of Systems  Constitutional: Negative for chills, fatigue and fever.  Eyes: Negative for itching.  Respiratory: Negative for chest tightness and shortness of breath.   Cardiovascular: Negative for chest pain, palpitations and leg swelling.  Gastrointestinal: Negative for blood in stool, constipation and diarrhea.  Musculoskeletal: Negative for back pain, joint swelling, neck pain and neck stiffness.  Neurological: Negative for dizziness, weakness, numbness and headaches.  Hematological: Does not bruise/bleed easily.  Psychiatric/Behavioral: Negative for sleep disturbance.  The patient is not nervous/anxious.      Objective: Vital Signs: There were no vitals taken for this visit.  Physical Exam  Ortho Exam leg lengths appear to be symmetrical. Painless range of motion left hip. Incision healing without evidence of infection. Skin otherwise intact. No swelling distally. Uses a cane for ambulation  Specialty Comments:  No specialty comments available.  Imaging: No results found.   PMFS History: Patient Active Problem List   Diagnosis Date Noted  . S/P hip replacement, left 10/09/2016  . Essential hypertension 08/30/2016  . Primary osteoarthritis of right hip 10/28/2014  . Hypokalemia 10/28/2014  . Primary osteoarthritis of left hip 10/26/2014  . S/P total hip arthroplasty 10/26/2014   Past Medical History:  Diagnosis Date  . Arthritis   . Chronic back pain   . Chronic pelvic pain in female    pelvic joint and thigh  . Chronic right hip pain   . Chronic shoulder pain   . Frequency of urination   . Heart murmur   . Hypertension   . Lumbago   . Neuralgia and neuritis   . Osteoarthritis   . Pneumonia    hx of  . PONV (postoperative nausea and vomiting)   . Radiculitis   . Sciatica   . 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 Hypertension Mother   . Depression Mother   . Hypertension  Father   . Cancer Father     Past Surgical History:  Procedure Laterality Date  . ABDOMINAL HYSTERECTOMY     partial  . BACK SURGERY    . CHOLECYSTECTOMY    . COLONOSCOPY W/ POLYPECTOMY    . JOINT REPLACEMENT    . TOTAL HIP ARTHROPLASTY Right 10/26/2014   Procedure: TOTAL HIP ARTHROPLASTY;  Surgeon: Valeria BatmanPeter W Codee Tutson, MD;  [facility];  Service: Orthopedics;  Laterality: Right;  . TOTAL HIP ARTHROPLASTY Left 10/09/2016  . TOTAL HIP ARTHROPLASTY Left 10/09/2016   Procedure: LEFT TOTAL HIP ARTHROPLASTY;  Surgeon: Valeria BatmanWhitfield, Kiwana Deblasi W, MD;  Location: [facility] OR;  Service: Orthopedics;  Laterality:  Left;   Social History   Occupational History  . Not on file.   Social History Main Topics  . Smoking status: Current Every Day Smoker    Packs/day: 0.25    Years: 56.00    Types: Cigarettes    Start date: 07/11/1965  . Smokeless tobacco: Never Used  . Alcohol use No  . Drug use: No  . Sexual activity: Not Currently

## 2017-01-09 ENCOUNTER — Inpatient Hospital Stay (INDEPENDENT_AMBULATORY_CARE_PROVIDER_SITE_OTHER): Payer: Medicare HMO | Admitting: Orthopaedic Surgery

## 2017-01-21 ENCOUNTER — Other Ambulatory Visit (HOSPITAL_COMMUNITY): Payer: Self-pay | Admitting: Nephrology

## 2017-01-21 DIAGNOSIS — N183 Chronic kidney disease, stage 3 unspecified: Secondary | ICD-10-CM

## 2017-02-28 ENCOUNTER — Encounter (HOSPITAL_COMMUNITY): Payer: Self-pay

## 2017-02-28 ENCOUNTER — Ambulatory Visit (HOSPITAL_COMMUNITY): Payer: Medicare HMO

## 2017-08-01 ENCOUNTER — Emergency Department (HOSPITAL_COMMUNITY): Payer: Medicare HMO

## 2017-08-01 ENCOUNTER — Encounter (HOSPITAL_COMMUNITY): Payer: Self-pay | Admitting: Emergency Medicine

## 2017-08-01 ENCOUNTER — Emergency Department (HOSPITAL_COMMUNITY)
Admission: EM | Admit: 2017-08-01 | Discharge: 2017-08-01 | Disposition: A | Discharge status: Home / Self Care | Payer: Medicare HMO | Attending: Emergency Medicine | Admitting: Emergency Medicine

## 2017-08-01 ENCOUNTER — Other Ambulatory Visit: Payer: Self-pay

## 2017-08-01 DIAGNOSIS — I10 Essential (primary) hypertension: Secondary | ICD-10-CM | POA: Insufficient documentation

## 2017-08-01 DIAGNOSIS — R1032 Left lower quadrant pain: Secondary | ICD-10-CM

## 2017-08-01 DIAGNOSIS — Z96643 Presence of artificial hip joint, bilateral: Secondary | ICD-10-CM | POA: Insufficient documentation

## 2017-08-01 DIAGNOSIS — K5792 Diverticulitis of intestine, part unspecified, without perforation or abscess without bleeding: Secondary | ICD-10-CM | POA: Insufficient documentation

## 2017-08-01 DIAGNOSIS — F17210 Nicotine dependence, cigarettes, uncomplicated: Secondary | ICD-10-CM | POA: Insufficient documentation

## 2017-08-01 LAB — CBC
HCT: 39 % (ref 36.0–46.0)
HEMOGLOBIN: 12.3 g/dL (ref 12.0–15.0)
MCH: 27.3 pg (ref 26.0–34.0)
MCHC: 31.5 g/dL (ref 30.0–36.0)
MCV: 86.7 fL (ref 78.0–100.0)
PLATELETS: 382 10*3/uL (ref 150–400)
RBC: 4.5 MIL/uL (ref 3.87–5.11)
RDW: 13.2 % (ref 11.5–15.5)
WBC: 10.3 10*3/uL (ref 4.0–10.5)

## 2017-08-01 LAB — URINALYSIS, ROUTINE W REFLEX MICROSCOPIC
Bilirubin Urine: NEGATIVE
GLUCOSE, UA: NEGATIVE mg/dL
Hgb urine dipstick: NEGATIVE
KETONES UR: NEGATIVE mg/dL
Leukocytes, UA: NEGATIVE
NITRITE: NEGATIVE
PH: 6 (ref 5.0–8.0)
Protein, ur: 30 mg/dL — AB
SPECIFIC GRAVITY, URINE: 1.02 (ref 1.005–1.030)

## 2017-08-01 LAB — COMPREHENSIVE METABOLIC PANEL
ALBUMIN: 3.8 g/dL (ref 3.5–5.0)
ALT: 15 U/L (ref 14–54)
AST: 13 U/L — AB (ref 15–41)
Alkaline Phosphatase: 113 U/L (ref 38–126)
Anion gap: 8 (ref 5–15)
BUN: 12 mg/dL (ref 6–20)
CHLORIDE: 101 mmol/L (ref 101–111)
CO2: 30 mmol/L (ref 22–32)
CREATININE: 1.08 mg/dL — AB (ref 0.44–1.00)
Calcium: 9.6 mg/dL (ref 8.9–10.3)
GFR calc non Af Amer: 50 mL/min — ABNORMAL LOW (ref >60)
GFR, EST AFRICAN AMERICAN: 58 mL/min — AB (ref >60)
Glucose, Bld: 144 mg/dL — ABNORMAL HIGH (ref 65–99)
Potassium: 3.4 mmol/L — ABNORMAL LOW (ref 3.5–5.1)
SODIUM: 139 mmol/L (ref 135–145)
Total Bilirubin: 0.9 mg/dL (ref 0.3–1.2)
Total Protein: 7.8 g/dL (ref 6.5–8.1)

## 2017-08-01 LAB — LIPASE, BLOOD: Lipase: 26 U/L (ref 11–51)

## 2017-08-01 MED ORDER — ONDANSETRON HCL 4 MG/2ML IJ SOLN
4.0000 mg | Freq: Once | INTRAMUSCULAR | Status: AC
  Administered 2017-08-01: 4 mg via INTRAVENOUS
  Filled 2017-08-01: qty 2

## 2017-08-01 MED ORDER — IOPAMIDOL (ISOVUE-300) INJECTION 61%
100.0000 mL | Freq: Once | INTRAVENOUS | Status: AC | PRN
  Administered 2017-08-01: 100 mL via INTRAVENOUS

## 2017-08-01 MED ORDER — CIPROFLOXACIN HCL 500 MG PO TABS: 500.0000 mg | ORAL_TABLET | Freq: Two times a day (BID) | ORAL | 0 refills | Status: DC

## 2017-08-01 MED ORDER — METRONIDAZOLE 500 MG PO TABS
500.0000 mg | ORAL_TABLET | Freq: Once | ORAL | Status: AC
  Administered 2017-08-01: 500 mg via ORAL
  Filled 2017-08-01: qty 1

## 2017-08-01 MED ORDER — ONDANSETRON 4 MG PO TBDP: 4.0000 mg | ORAL_TABLET | Freq: Three times a day (TID) | ORAL | 0 refills | Status: DC | PRN

## 2017-08-01 MED ORDER — CIPROFLOXACIN HCL 250 MG PO TABS
500.0000 mg | ORAL_TABLET | Freq: Once | ORAL | Status: AC
  Administered 2017-08-01: 500 mg via ORAL
  Filled 2017-08-01: qty 2

## 2017-08-01 MED ORDER — METRONIDAZOLE 500 MG PO TABS: 500.0000 mg | ORAL_TABLET | Freq: Two times a day (BID) | ORAL | 0 refills | Status: DC

## 2017-08-01 MED ORDER — FENTANYL CITRATE (PF) 100 MCG/2ML IJ SOLN
50.0000 ug | Freq: Once | INTRAMUSCULAR | Status: AC
  Administered 2017-08-01: 50 ug via INTRAVENOUS
  Filled 2017-08-01: qty 2

## 2017-08-01 MED ORDER — SODIUM CHLORIDE 0.9 % IV BOLUS
500.0000 mL | Freq: Once | INTRAVENOUS | Status: AC
  Administered 2017-08-01: 500 mL via INTRAVENOUS

## 2017-08-01 NOTE — ED Provider Notes (Signed)
Emergency Department Provider Note   I have reviewed the triage vital signs and the nursing notes.   HISTORY  Chief Complaint Abdominal Pain   HPI Jessica Hoover is a 72 y.o. female with PMH of diverticulitis, HTN, and sciatica presents to the emergency department for evaluation of worsening abdominal pain.  The patient has had 2 weeks of mild, diffuse abdominal discomfort.  5 days ago she experienced a sudden worsening of pain which seemed localized in the left lower quadrant.  The patient attributes this to eating rice and continued to have discomfort with associated nausea since that time.  The patient denies any associated vomiting or diarrhea.  She continues to have regular bowel movements and does not appreciate any blood in her stools.  Her last colonoscopy was last year.  She denies any chest pain or difficulty breathing.  States her left lower quadrant pain is now radiating throughout her entire abdomen is worse with touching the area. Denies fever/chills.   Past Medical History:  Diagnosis Date  . Arthritis   . Chronic back pain   . Chronic pelvic pain in female    pelvic joint and thigh  . Chronic right hip pain   . Chronic shoulder pain   . Frequency of urination   . Heart murmur   . Hypertension   . Lumbago   . Neuralgia and neuritis   . Osteoarthritis   . Pneumonia    hx of  . PONV (postoperative nausea and vomiting)   . Radiculitis   . Sciatica   . 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 S/P hip replacement, left 10/09/2016  . Essential hypertension 08/30/2016  . Primary osteoarthritis of right hip 10/28/2014  . Hypokalemia 10/28/2014  . Primary osteoarthritis of left hip 10/26/2014  . S/P total hip arthroplasty 10/26/2014    Past Surgical History:  Procedure Laterality Date  . ABDOMINAL HYSTERECTOMY     partial  . BACK SURGERY    . CHOLECYSTECTOMY    . COLONOSCOPY W/ POLYPECTOMY    .  JOINT REPLACEMENT    . TOTAL HIP ARTHROPLASTY Right 10/26/2014   Procedure: TOTAL HIP ARTHROPLASTY;  Surgeon: Valeria Batman, MD;  Location: [facility] OR;  Service: Orthopedics;  Laterality: Right;  . TOTAL HIP ARTHROPLASTY Left 10/09/2016  . TOTAL HIP ARTHROPLASTY Left 10/09/2016   Procedure: LEFT TOTAL HIP ARTHROPLASTY;  Surgeon: Valeria Batman, MD;  Location: [facility] OR;  Service: Orthopedics;  Laterality: Left;    Current Outpatient Rx  . Order #: [id number] Class: Historical Med  . Order #: [id number] Class: Historical Med  . Order #: [id number] Class: Print  . Order #: [id number] Class: Print  . Order #: [id number] Class: Print    Allergies Clarithromycin; Codeine; Hydrocodone; Penicillins; Sulfa antibiotics; Dilaudid [hydromorphone hcl]; and Oxycodone hcl  Family History  Problem Relation Age of Onset  . Hypertension Mother   . Depression Mother   . Hypertension Father   . Cancer Father     Social History Social History   Tobacco Use  . Smoking status: Current Every Day Smoker    Packs/day: 0.25    Years: 56.00    Pack years: 14.00    Types: Cigarettes    Start date: 07/11/1965  . Smokeless tobacco: Never Used  Substance Use Topics  . Alcohol use: No    Alcohol/week: 0.0 oz  . Drug use: No    Review of  Systems  Constitutional: No fever/chills Eyes: No visual changes. ENT: No sore throat. Cardiovascular: Denies chest pain. Respiratory: Denies shortness of breath. Gastrointestinal: Positive LLQ abdominal pain. Positive nausea, no vomiting.  No diarrhea.  No constipation. Genitourinary: Negative for dysuria. Musculoskeletal: Negative for back pain. Skin: Negative for rash. Neurological: Negative for headaches, focal weakness or numbness.  10-point ROS otherwise negative.  ____________________________________________   PHYSICAL EXAM:  VITAL SIGNS: ED Triage Vitals  Enc Vitals Group     BP 08/01/17 1554 (!) 145/75     Pulse Rate 08/01/17 1554 84     Resp 08/01/17  1554 16     Temp 08/01/17 1554 98.1 F (36.7 C)     Temp Source 08/01/17 1554 Oral     SpO2 08/01/17 1554 97 %     Weight 08/01/17 1556 174 lb (78.9 kg)     Height 08/01/17 1556  (1.727 m)     Pain Score 08/01/17 1554 8   Constitutional: Alert and oriented. Well appearing and in no acute distress. Eyes: Conjunctivae are normal.  Head: Atraumatic. Nose: No congestion/rhinnorhea. Mouth/Throat: Mucous membranes are moist. Neck: No stridor.  Cardiovascular: Normal rate, regular rhythm. Good peripheral circulation. Grossly normal heart sounds.   Respiratory: Normal respiratory effort.  No retractions. Lungs CTAB. Gastrointestinal: Soft with diffuse moderate tenderness without rebound. Focal LLQ tenderness with voluntary guarding. No distention.  Musculoskeletal: No lower extremity tenderness nor edema. No gross deformities of extremities. Neurologic:  Normal speech and language. No gross focal neurologic deficits are appreciated.  Skin:  Skin is warm, dry and intact. No rash noted. ____________________________________________   LABS (all labs ordered are listed, but only abnormal results are displayed)  Labs Reviewed  COMPREHENSIVE METABOLIC PANEL - Abnormal; Notable for the following components:      Result Value   Potassium 3.4 (*)    Glucose, Bld 144 (*)    Creatinine, Ser 1.08 (*)    AST 13 (*)    GFR calc non Af Amer 50 (*)    GFR calc Af Amer 58 (*)    All other components within normal limits  URINALYSIS, ROUTINE W REFLEX MICROSCOPIC - Abnormal; Notable for the following components:   Color, Urine AMBER (*)    APPearance HAZY (*)    Protein, ur 30 (*)    Bacteria, UA MANY (*)    All other components within normal limits  LIPASE, BLOOD  CBC   ____________________________________________  RADIOLOGY  Ct Abdomen Pelvis W Contrast  Result Date: 08/01/2017 CLINICAL DATA:  Lower abdominal pain with nausea for 2 weeks, history of diverticulitis and hypertension EXAM:  CT ABDOMEN AND PELVIS WITH CONTRAST TECHNIQUE: Multidetector CT imaging of the abdomen and pelvis was performed using the standard protocol following bolus administration of intravenous contrast. Sagittal and coronal MPR images reconstructed from axial data set. CONTRAST:  ISOVUE-300 IOPAMIDOL (ISOVUE-300) INJECTION 61% IV. No oral contrast. COMPARISON:  01/25/2012 FINDINGS: Lower chest: Lung bases clear Hepatobiliary: Fatty infiltration of liver. Gallbladder surgically absent. Area of more pronounced low-attenuation in the periportal region of the liver likely reflects more prominent fatty deposition. No definite hepatic mass lesions. Pancreas: Normal appearance Spleen: Normal appearance Adrenals/Urinary Tract: Adrenal glands normal appearance. 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 No ureteral dilatation or hydronephrosis. No renal mass lesions. Distal ureters and bladder obscured by beam hardening artifacts in pelvis. Stomach/Bowel: Diverticulosis of descending and sigmoid colon with wall thickening and pericolic inflammatory changes at the descending sigmoid junction consistent with  acute diverticulitis. No evidence of perforation or abscess. Stomach and remaining bowel loops normal appearance. Normal appendix. Vascular/Lymphatic: Atherosclerotic calcifications aorta and iliac arteries without aneurysm. No adenopathy. Reproductive: Post hysterectomy by history.  Normal sized ovaries. Other: No free air or free fluid. Tiny umbilical hernia containing fat. Musculoskeletal: BILATERAL hip prostheses with extensive beam hardening artifacts in pelvis. Degenerative disc disease changes L5-S1 with diffusely bulging disc and mild retrolisthesis. Pronounced degenerative disc disease changes with endplate spur formation and sclerosis at T8-T9. IMPRESSION: Acute diverticulitis at the descending/sigmoid junction. No evidence of perforation or abscess. Fatty infiltration of liver.  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 Electronically Signed   By: Ulyses Southward M.D.   On: 08/01/2017 18:48    ____________________________________________   PROCEDURES  Procedure(s) performed:   Procedures  None ____________________________________________   INITIAL IMPRESSION / ASSESSMENT AND PLAN / ED COURSE  Pertinent labs & imaging results that were available during my care of the patient were reviewed by me and considered in my medical decision making (see chart for details).  Patient presents to the emergency department for evaluation of left lower quadrant abdominal pain which has since become more diffuse over the past 5 days.  She is afebrile.  She does have focal left lower quadrant tenderness to palpation with some voluntary guarding.  She also has moderate diffuse tenderness to palpation of the abdomen.  Suspicion is elevated for diverticulitis.  Lower suspicion for vascular etiology with intact lower extremity pulses and history of diverticulitis in the past.  Plan for CT imaging of the abdomen and pelvis.   CT imaging shows acute diverticulitis without complication.  Labs reviewed with no acute findings other than mild leukocytosis.  Patient reports feeling much better after pain medication in the emergency department.  She is tolerating p.o. without difficulty.  At this time it seems reasonable to discharge the patient with trial of outpatient antibiotic therapy for acute diverticulitis.  Discussed return precautions in detail.   At this time, I do not feel there is any life-threatening condition present. I have reviewed and discussed all results (EKG, imaging, lab, urine as appropriate), exam findings with patient. I have reviewed nursing notes and appropriate previous records.  I feel the patient is safe to be discharged home without further emergent workup. Discussed usual and customary return precautions. Patient and family (if present) verbalize  understanding and are comfortable with this plan.  Patient will follow-up with their primary care provider. If they do not have a primary care provider, information for follow-up has been provided to them. All questions have been answered.  ____________________________________________  FINAL CLINICAL IMPRESSION(S) / ED DIAGNOSES  Final diagnoses:  Left lower quadrant pain  Diverticulitis     MEDICATIONS GIVEN DURING THIS VISIT:  Medications  sodium chloride 0.9 % bolus 500 mL (0 mLs Intravenous Stopped 08/01/17 1808)  ondansetron (ZOFRAN) injection 4 mg (4 mg Intravenous Given 08/01/17 1708)  fentaNYL (SUBLIMAZE) injection 50 mcg (50 mcg Intravenous Given 08/01/17 1708)  iopamidol (ISOVUE-300) 61 % injection 100 mL (100 mLs Intravenous Contrast Given 08/01/17 1808)  ciprofloxacin (CIPRO) tablet 500 mg (500 mg Oral Given 08/01/17 1905)  metroNIDAZOLE (FLAGYL) tablet 500 mg (500 mg Oral Given 08/01/17 1905)     NEW OUTPATIENT MEDICATIONS STARTED DURING THIS VISIT:  Discharge Medication List as of 08/01/2017  7:27 PM    START taking these medications   Details  ciprofloxacin (CIPRO) 500 MG tablet Take 1 tablet (500 mg total) by mouth 2 (two)  times daily., Starting Thu 08/01/2017, Print    metroNIDAZOLE (FLAGYL) 500 MG tablet Take 1 tablet (500 mg total) by mouth 2 (two) times daily., Starting Thu 08/01/2017, Print    ondansetron (ZOFRAN ODT) 4 MG disintegrating tablet Take 1 tablet (4 mg total) by mouth every 8 (eight) hours as needed for nausea or vomiting., Starting Thu 08/01/2017, Print        Note:  This document was prepared using Dragon voice recognition software and may include unintentional dictation errors.  Alona Bene, MD Emergency Medicine    Camil Hausmann, Arlyss Repress, MD 08/01/17 2113

## 2017-08-01 NOTE — ED Triage Notes (Signed)
Pt c/o of lower abdominal pain with nausea x2 weeks. HX of diverticulitis

## 2017-08-01 NOTE — Discharge Instructions (Addendum)
We believe your symptoms are caused by diverticulitis.  Most of the time this condition (please read through the included information) can be cured with outpatient antibiotics.  Please take the full course of prescribed medication(s) and follow up with the doctors recommended above. ° °Return to the ED if your abdominal pain worsens or fails to improve, you develop bloody vomiting, bloody diarrhea, you are unable to tolerate fluids due to vomiting, fever greater than 101, or other symptoms that concern you. °  °Diverticulitis °Diverticulitis is inflammation or infection of small pouches in your colon that form when you have a condition called diverticulosis. The pouches in your colon are called diverticula. Your colon, or large intestine, is where water is absorbed and stool is formed. °Complications of diverticulitis can include: °Bleeding. °Severe infection. °Severe pain. °Perforation of your colon. °Obstruction of your colon. °CAUSES  °Diverticulitis is caused by bacteria. °Diverticulitis happens when stool becomes trapped in diverticula. This allows bacteria to grow in the diverticula, which can lead to inflammation and infection. °RISK FACTORS °People with diverticulosis are at risk for diverticulitis. Eating a diet that does not include enough fiber from fruits and vegetables may make diverticulitis more likely to develop. °SYMPTOMS  °Symptoms of diverticulitis may include: °Abdominal pain and tenderness. The pain is normally located on the left side of the abdomen, but may occur in other areas. °Fever and chills. °Bloating. °Cramping. °Nausea. °Vomiting. °Constipation. °Diarrhea. °Blood in your stool. °DIAGNOSIS  °Your health care provider will ask you about your medical history and do a physical exam. You may need to have tests done because many medical conditions can cause the same symptoms as diverticulitis. Tests may include: °Blood tests. °Urine tests. °Imaging tests of the abdomen, including X-rays and  CT scans. °When your condition is under control, your health care provider may recommend that you have a colonoscopy. A colonoscopy can show how severe your diverticula are and whether something else is causing your symptoms. °TREATMENT  °Most cases of diverticulitis are mild and can be treated at home. Treatment may include: °Taking over-the-counter pain medicines. °Following a clear liquid diet. °Taking antibiotic medicines by mouth for 7-10 days. °More severe cases may be treated at a hospital. Treatment may include: °Not eating or drinking. °Taking prescription pain medicine. °Receiving antibiotic medicines through an IV tube. °Receiving fluids and nutrition through an IV tube. °Surgery. °HOME CARE INSTRUCTIONS  °Follow your health care provider's instructions carefully. °Follow a full liquid diet or other diet as directed by your health care provider. After your symptoms improve, your health care provider may tell you to change your diet. He or she may recommend you eat a high-fiber diet. Fruits and vegetables are good sources of fiber. Fiber makes it easier to pass stool. °Take fiber supplements or probiotics as directed by your health care provider. °Only take medicines as directed by your health care provider. °Keep all your follow-up appointments. °SEEK MEDICAL CARE IF:  °Your pain does not improve. °You have a hard time eating food. °Your bowel movements do not return to normal. °SEEK IMMEDIATE MEDICAL CARE IF:  °Your pain becomes worse. °Your symptoms do not get better. °Your symptoms suddenly get worse. °You have a fever. °You have repeated vomiting. °You have bloody or black, tarry stools. °MAKE SURE YOU:  °Understand these instructions. °Will watch your condition. °Will get help right away if you are not doing well or get worse. °Document Released: 12/20/2004 Document Revised: 03/17/2013 Document Reviewed: 02/04/2013 °[employer]® Patient Information ©2015 [employer].   This information is not intended  to replace advice given to you by your health care provider. Make sure you discuss any questions you have with your health care provider. ° ° °

## 2017-12-30 ENCOUNTER — Ambulatory Visit: Payer: Medicare HMO | Admitting: Cardiology

## 2017-12-30 DIAGNOSIS — R0989 Other specified symptoms and signs involving the circulatory and respiratory systems: Secondary | ICD-10-CM

## 2017-12-30 NOTE — Progress Notes (Deleted)
Clinical Summary Jessica Hoover is a 72 y.o.female  1. HTN    2. Aortic stenosis - 05/2015 echo LVEF 60-65%, mild AS. ??Grad Past Medical History:  Diagnosis Date  . Arthritis   . Chronic back pain   . Chronic pelvic pain in female    pelvic joint and thigh  . Chronic right hip pain   . Chronic shoulder pain   . Frequency of urination   . Heart murmur   . Hypertension   . Lumbago   . Neuralgia and neuritis   . Osteoarthritis   . Pneumonia    hx of  . PONV (postoperative nausea and vomiting)   . Radiculitis   . Sciatica   . 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 Clarithromycin Shortness Of Breath  . Codeine Shortness Of Breath  . Hydrocodone Shortness Of Breath  . Penicillins Shortness Of Breath and Other (See Comments)     PATIENT HAS HAD A PCN REACTION WITH IMMEDIATE RASH, FACIAL/TONGUE/THROAT SWELLING, SOB, OR LIGHTHEADEDNESS WITH HYPOTENSION:  #  #  #  YES  #  #  #   Has patient had a PCN reaction causing severe rash involving mucus membranes or skin necrosis: No Has patient had a PCN reaction that required hospitalization: No Has patient had a PCN reaction occurring within the last 10 years: No   . Sulfa Antibiotics Shortness Of Breath  . Dilaudid [Hydromorphone Hcl] Other (See Comments)    ORAL SORES  . Oxycodone Hcl Nausea Only     Current Outpatient Medications  Medication Sig Dispense Refill  . amLODipine (NORVASC) 10 MG tablet Take 10 mg by mouth daily.    . ciprofloxacin (CIPRO) 500 MG tablet Take 1 tablet (500 mg total) by mouth 2 (two) times daily. 14 tablet 0  . metroNIDAZOLE (FLAGYL) 500 MG tablet Take 1 tablet (500 mg total) by mouth 2 (two) times daily. 14 tablet 0  . ondansetron (ZOFRAN ODT) 4 MG disintegrating tablet Take 1 tablet (4 mg total) by mouth every 8 (eight) hours as needed for nausea or vomiting. 20 tablet 0  . traMADol (ULTRAM) 50 MG tablet Take by mouth as needed.     No current  facility-administered medications for this visit.      Past Surgical History:  Procedure Laterality Date  . ABDOMINAL HYSTERECTOMY     partial  . BACK SURGERY    . CHOLECYSTECTOMY    . COLONOSCOPY W/ POLYPECTOMY    . JOINT REPLACEMENT    . TOTAL HIP ARTHROPLASTY Right 10/26/2014   Procedure: TOTAL HIP ARTHROPLASTY;  Surgeon: Valeria Batman, MD;  Location: [facility] OR;  Service: Orthopedics;  Laterality: Right;  . TOTAL HIP ARTHROPLASTY Left 10/09/2016  . TOTAL HIP ARTHROPLASTY Left 10/09/2016   Procedure: LEFT TOTAL HIP ARTHROPLASTY;  Surgeon: Valeria Batman, MD;  Location: [facility] OR;  Service: Orthopedics;  Laterality: Left;     Allergies  Allergen Reactions  . Clarithromycin Shortness Of Breath  . Codeine Shortness Of Breath  . Hydrocodone Shortness Of Breath  . Penicillins Shortness Of Breath and Other (See Comments)     PATIENT HAS HAD A PCN REACTION WITH IMMEDIATE RASH, FACIAL/TONGUE/THROAT SWELLING, SOB, OR LIGHTHEADEDNESS WITH HYPOTENSION:  #  #  #  YES  #  #  #   Has patient had a PCN reaction causing severe rash involving mucus membranes or skin necrosis: No Has patient  had a PCN reaction that required hospitalization: No Has patient had a PCN reaction occurring within the last 10 years: No   . Sulfa Antibiotics Shortness Of Breath  . Dilaudid [Hydromorphone Hcl] Other (See Comments)    ORAL SORES  . Oxycodone Hcl Nausea Only      Family History  Problem Relation Age of Onset  . Hypertension Mother   . Depression Mother   . Hypertension Father   . Cancer Father      Social History Jessica Hoover reports that she has been smoking cigarettes. She started smoking about 52 years ago. She has a 14.00 pack-year smoking history. She has never used smokeless tobacco. Jessica Hoover reports that she does not drink alcohol.   Review of Systems CONSTITUTIONAL: No weight loss, fever, chills, weakness or fatigue.  HEENT: Eyes: No visual loss, blurred vision, double vision or  yellow sclerae.No hearing loss, sneezing, congestion, runny nose or sore throat.  SKIN: No rash or itching.  CARDIOVASCULAR:  RESPIRATORY: No shortness of breath, cough or sputum.  GASTROINTESTINAL: No anorexia, nausea, vomiting or diarrhea. No abdominal pain or blood.  GENITOURINARY: No burning on urination, no polyuria NEUROLOGICAL: No headache, dizziness, syncope, paralysis, ataxia, numbness or tingling in the extremities. No change in bowel or bladder control.  MUSCULOSKELETAL: No muscle, back pain, joint pain or stiffness.  LYMPHATICS: No enlarged nodes. No history of splenectomy.  PSYCHIATRIC: No history of depression or anxiety.  ENDOCRINOLOGIC: No reports of sweating, cold or heat intolerance. No polyuria or polydipsia.  Marland Kitchen   Physical Examination There were no vitals filed for this visit. There were no vitals filed for this visit.  Gen: resting comfortably, no acute distress HEENT: no scleral icterus, pupils equal round and reactive, no palptable cervical adenopathy,  CV Resp: Clear to auscultation bilaterally GI: abdomen is soft, non-tender, non-distended, normal bowel sounds, no hepatosplenomegaly MSK: extremities are warm, no edema.  Skin: warm, no rash Neuro:  no focal deficits Psych: appropriate affect   Diagnostic Studies   10/2014 CT PE IMPRESSION: There is no demonstrable pulmonary embolus. There are areas of atelectatic change but no frank edema or consolidation. No adenopathy. There is hepatic steatosis. 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   05/2015 echo Study Conclusions  - Left ventricle: The cavity size was normal. Wall thickness was increased in a pattern of moderate LVH. Systolic function was normal. The estimated ejection fraction was in the range of 60% to 65%. Wall motion was normal; there were no regional wall motion abnormalities. Doppler parameters are consistent with abnormal left ventricular relaxation (grade 1  diastolic dysfunction). - Aortic valve: Mildly thickened, moderately calcified leaflets. Aortic valve sclerosis noted. Morphologically, there appears to be at least mild stenosis. No hemodynamic parameters obtained. - Mitral valve: Mildly calcified annulus.  05/2015 Carotid US 1-39% bilateral disease Assessment and Plan        Antoine Poche, M.D., F.A.C.C.

## 2017-12-31 ENCOUNTER — Encounter: Payer: Self-pay | Admitting: Cardiology

## 2018-05-15 ENCOUNTER — Telehealth (INDEPENDENT_AMBULATORY_CARE_PROVIDER_SITE_OTHER): Payer: Self-pay | Admitting: Orthopaedic Surgery

## 2018-05-15 NOTE — Telephone Encounter (Signed)
Tobi Bastos from [facility] called checking if patient still needs to continue taking medication for dental work.  Patient is scheduled for a procedure at the end of March.  Tobi Bastos states she will be faxing a form for Dr. Cleophas Dunker to fill out and return to their office.  If you have any questions, please call back at #[phone]

## 2018-05-23 IMAGING — DX DG HIP (WITH OR WITHOUT PELVIS) 1V PORT*L*
2 series · 2 of 2 positions shown · non-contrast
Comparison: Portable exam at 8517 hrs compared to 09/19/2016

CLINICAL DATA: Post LEFT total hip arthroplasty

EXAM:
DG HIP (WITH OR WITHOUT PELVIS) 1V PORT LEFT

[hip lat]
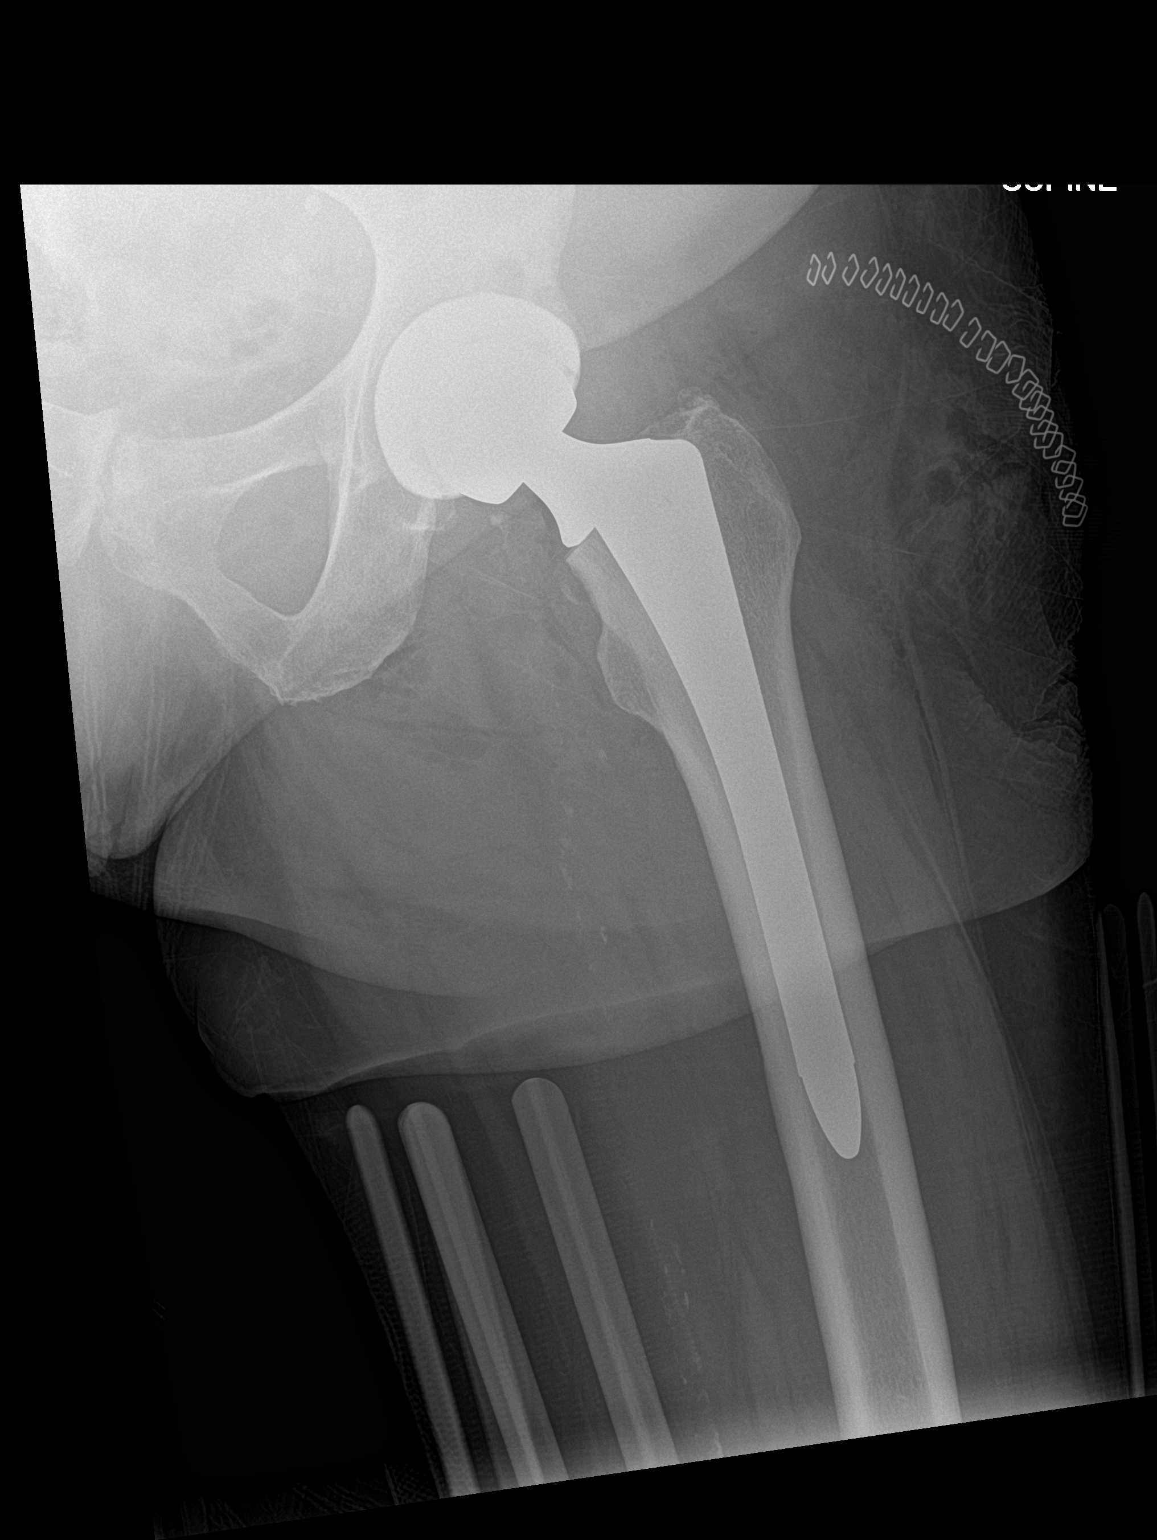

[pelvis ap]
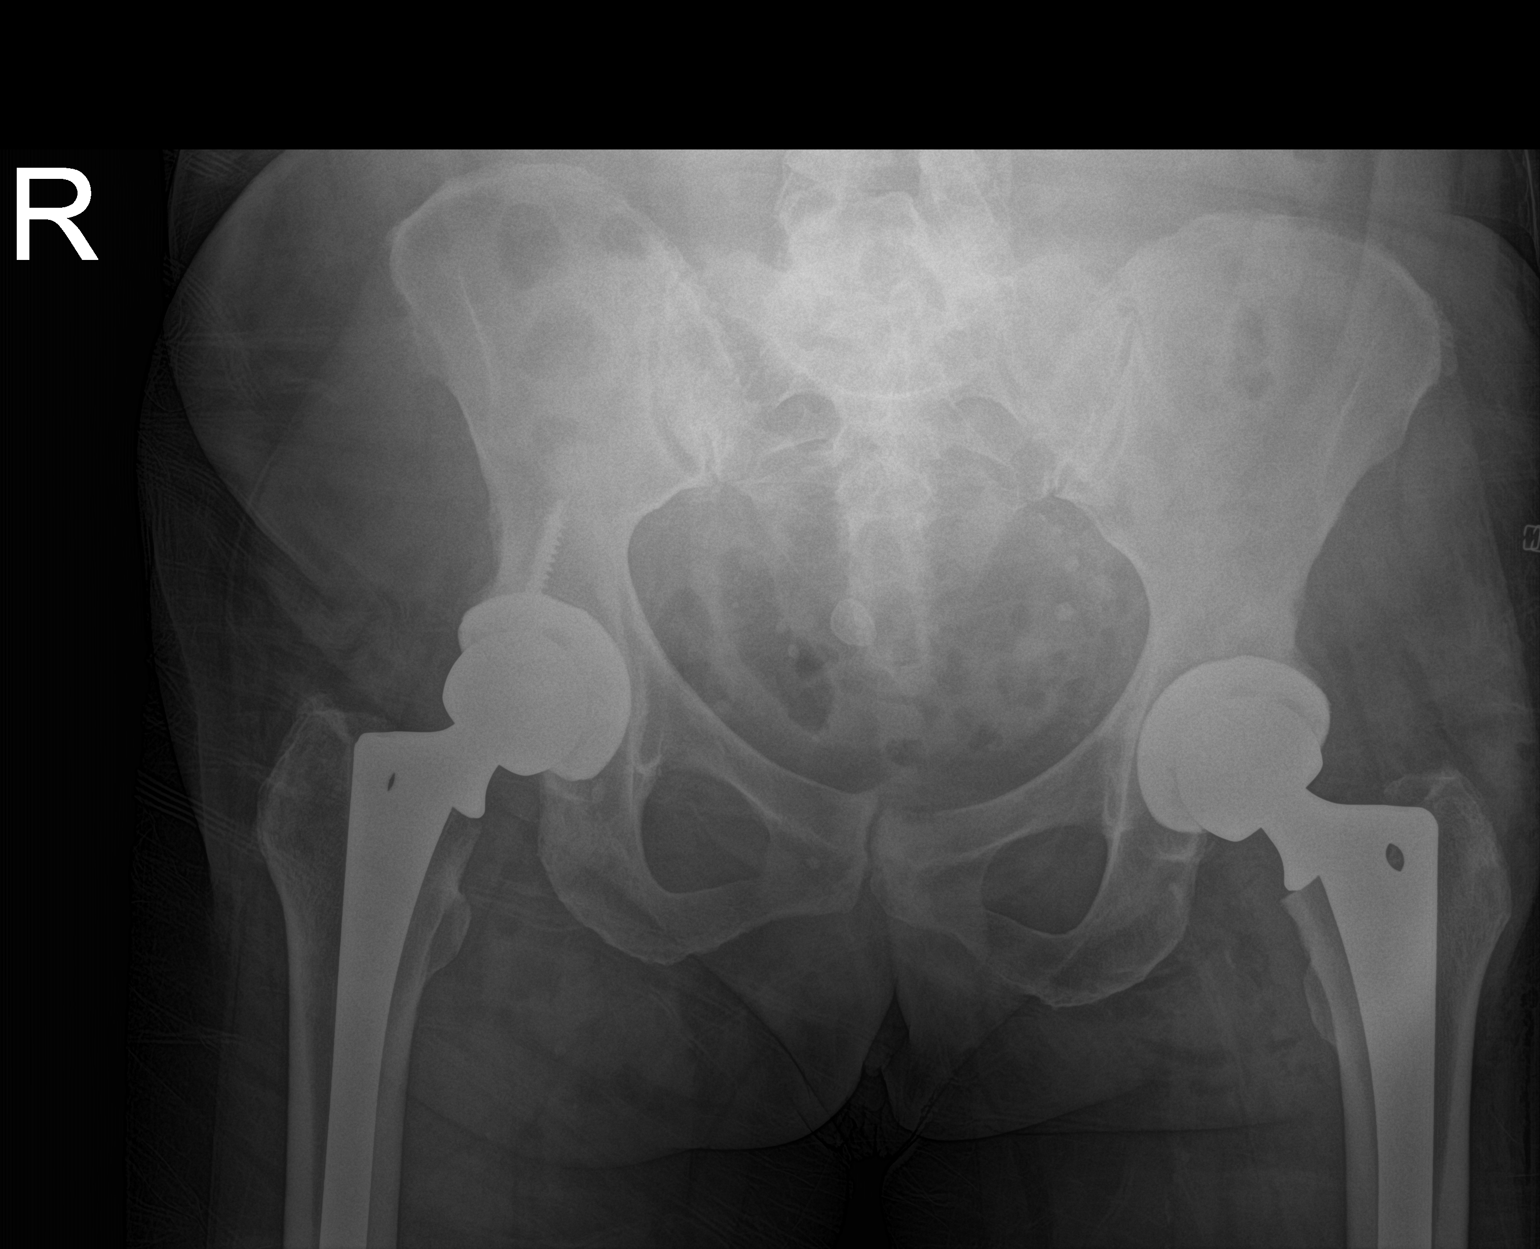

[2 of 2 positions shown; findings below may reference images not displayed]

FINDINGS: BILATERAL total hip arthroplasties, new on LEFT.

No acute fracture or dislocation.

Overlying skin clips and soft tissue changes related to surgery.

Scattered atherosclerotic calcifications.
IMPRESSION: LEFT hip prosthesis without acute complication.

## 2018-06-01 IMAGING — DX DG HIP (WITH OR WITHOUT PELVIS) 2-3V*L*
3 series · 3 of 3 positions shown · non-contrast
Comparison: None.

CLINICAL DATA: Lateral LEFT hip pain after recent hip replacement

EXAM:
DG HIP (WITH OR WITHOUT PELVIS) 2-3V LEFT

[t pelvis ap]
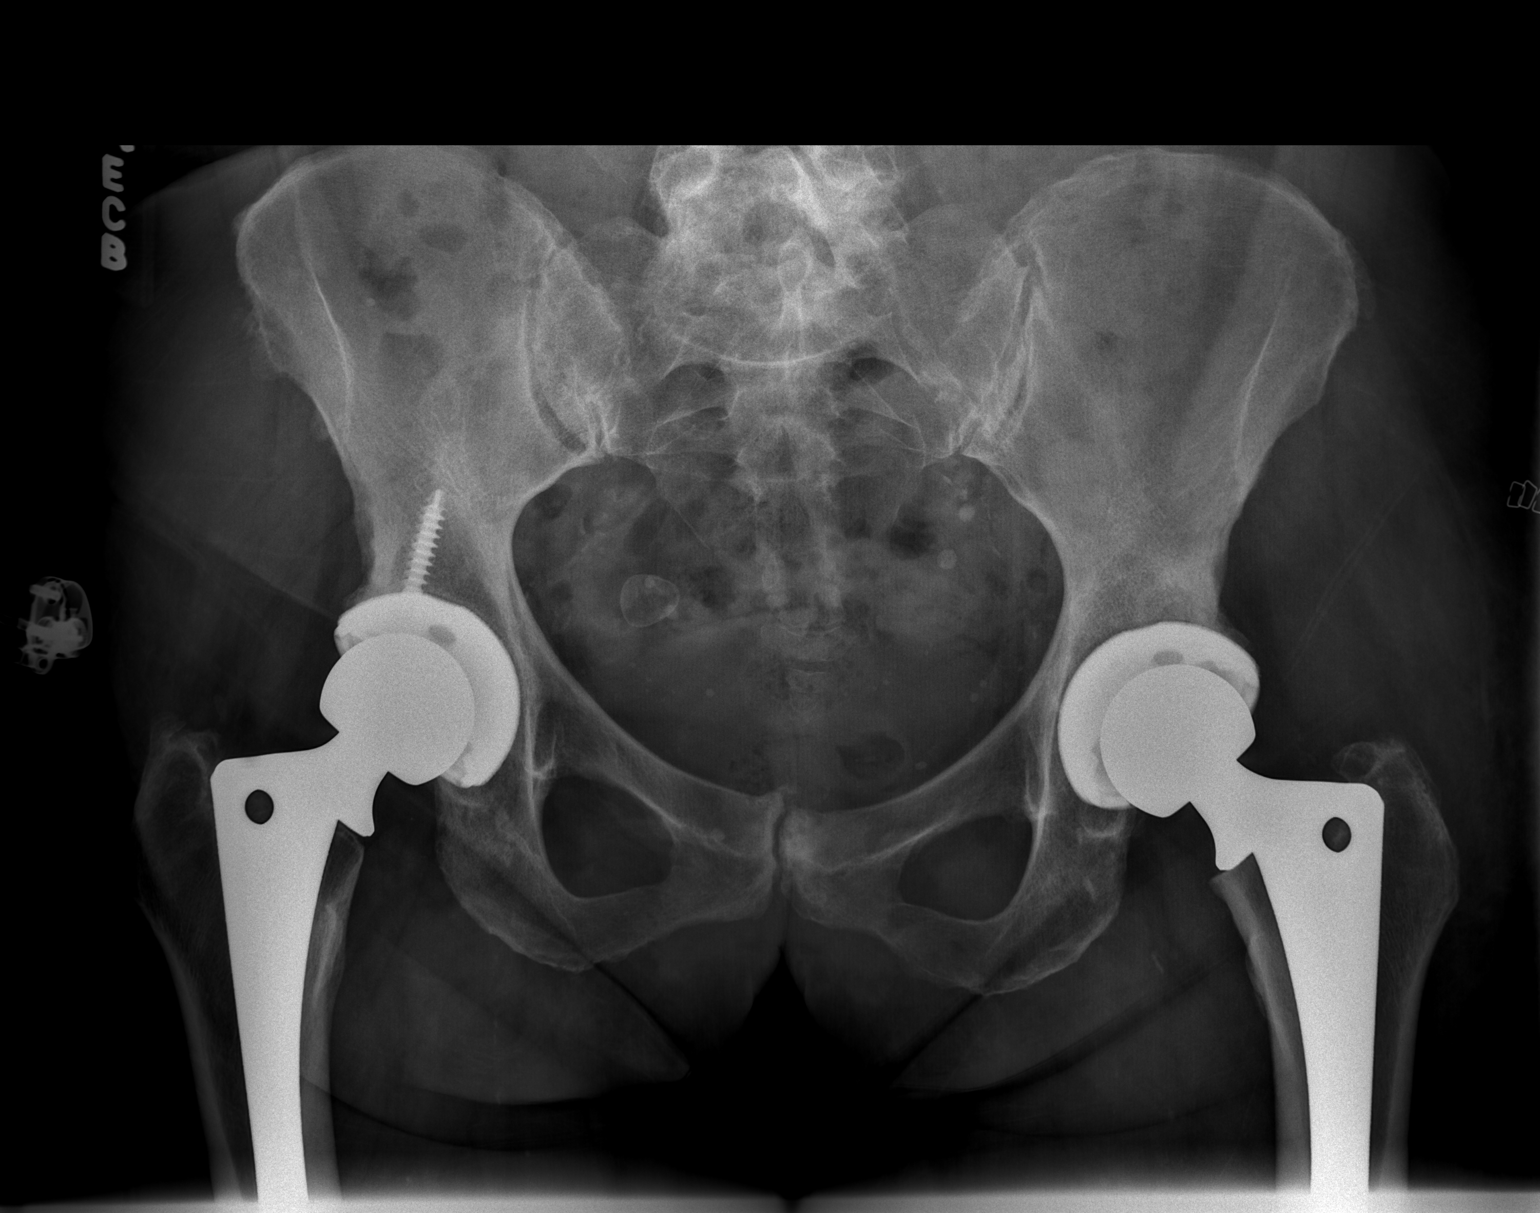

[t hip ap left]
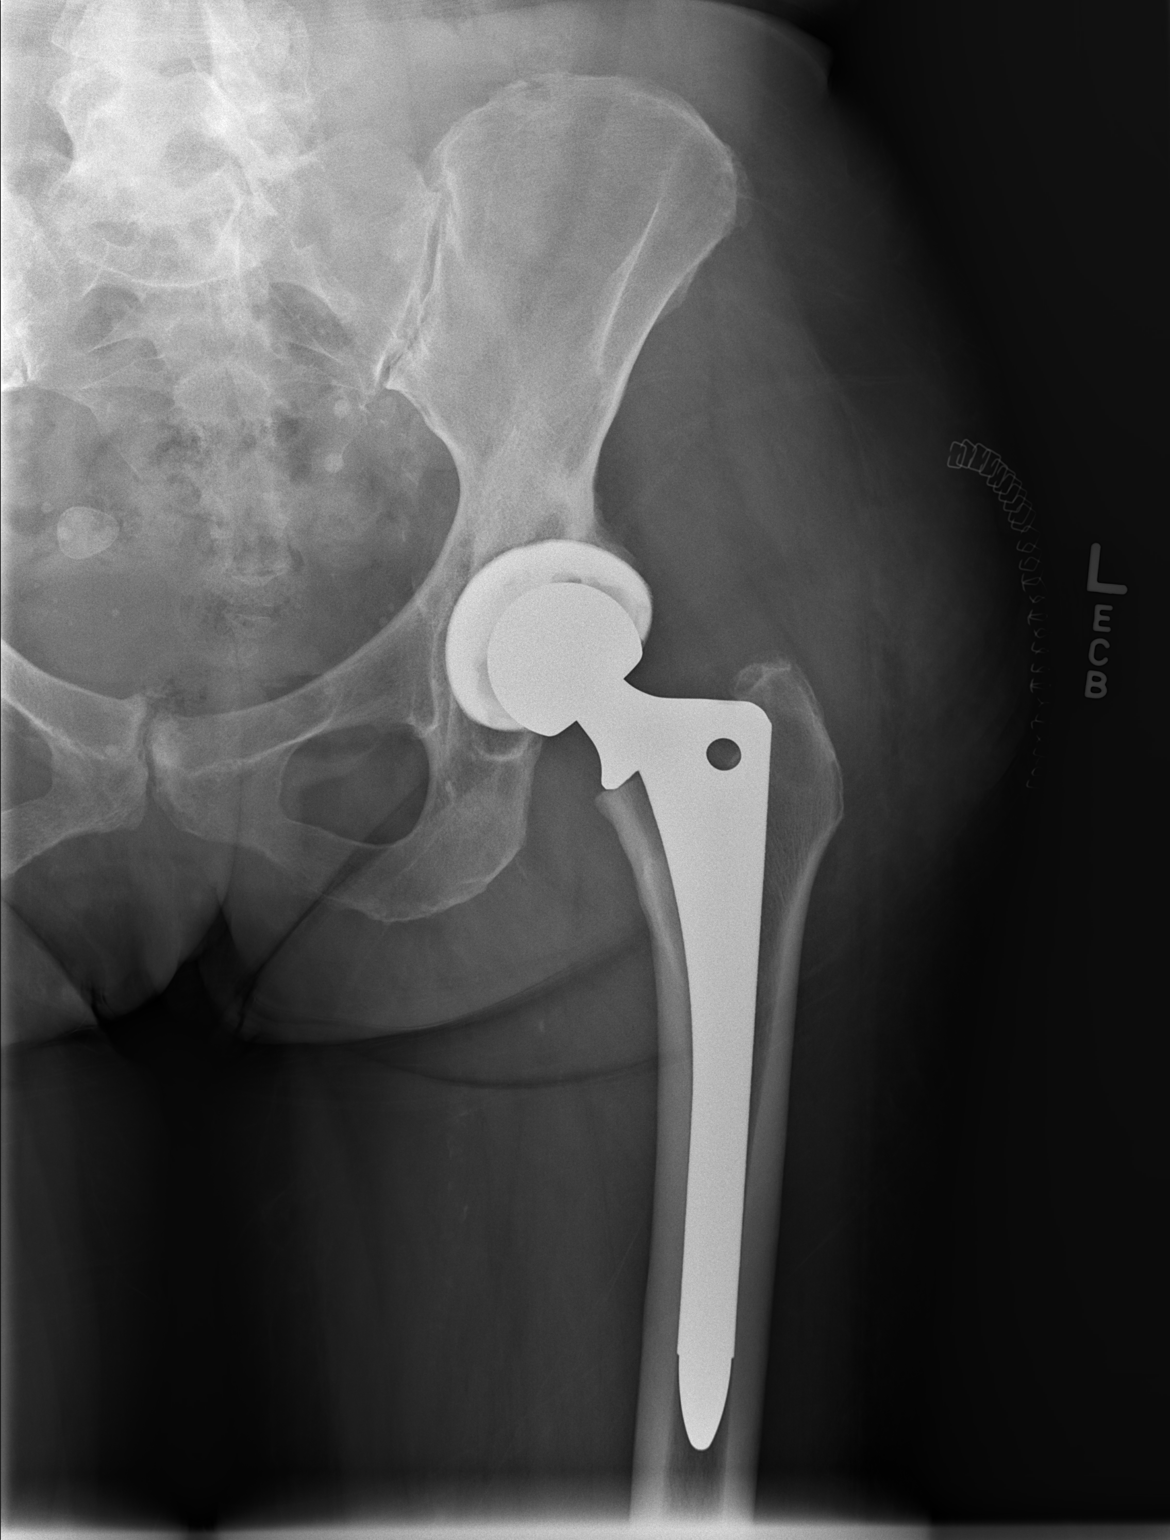

[t hip frog leg left]
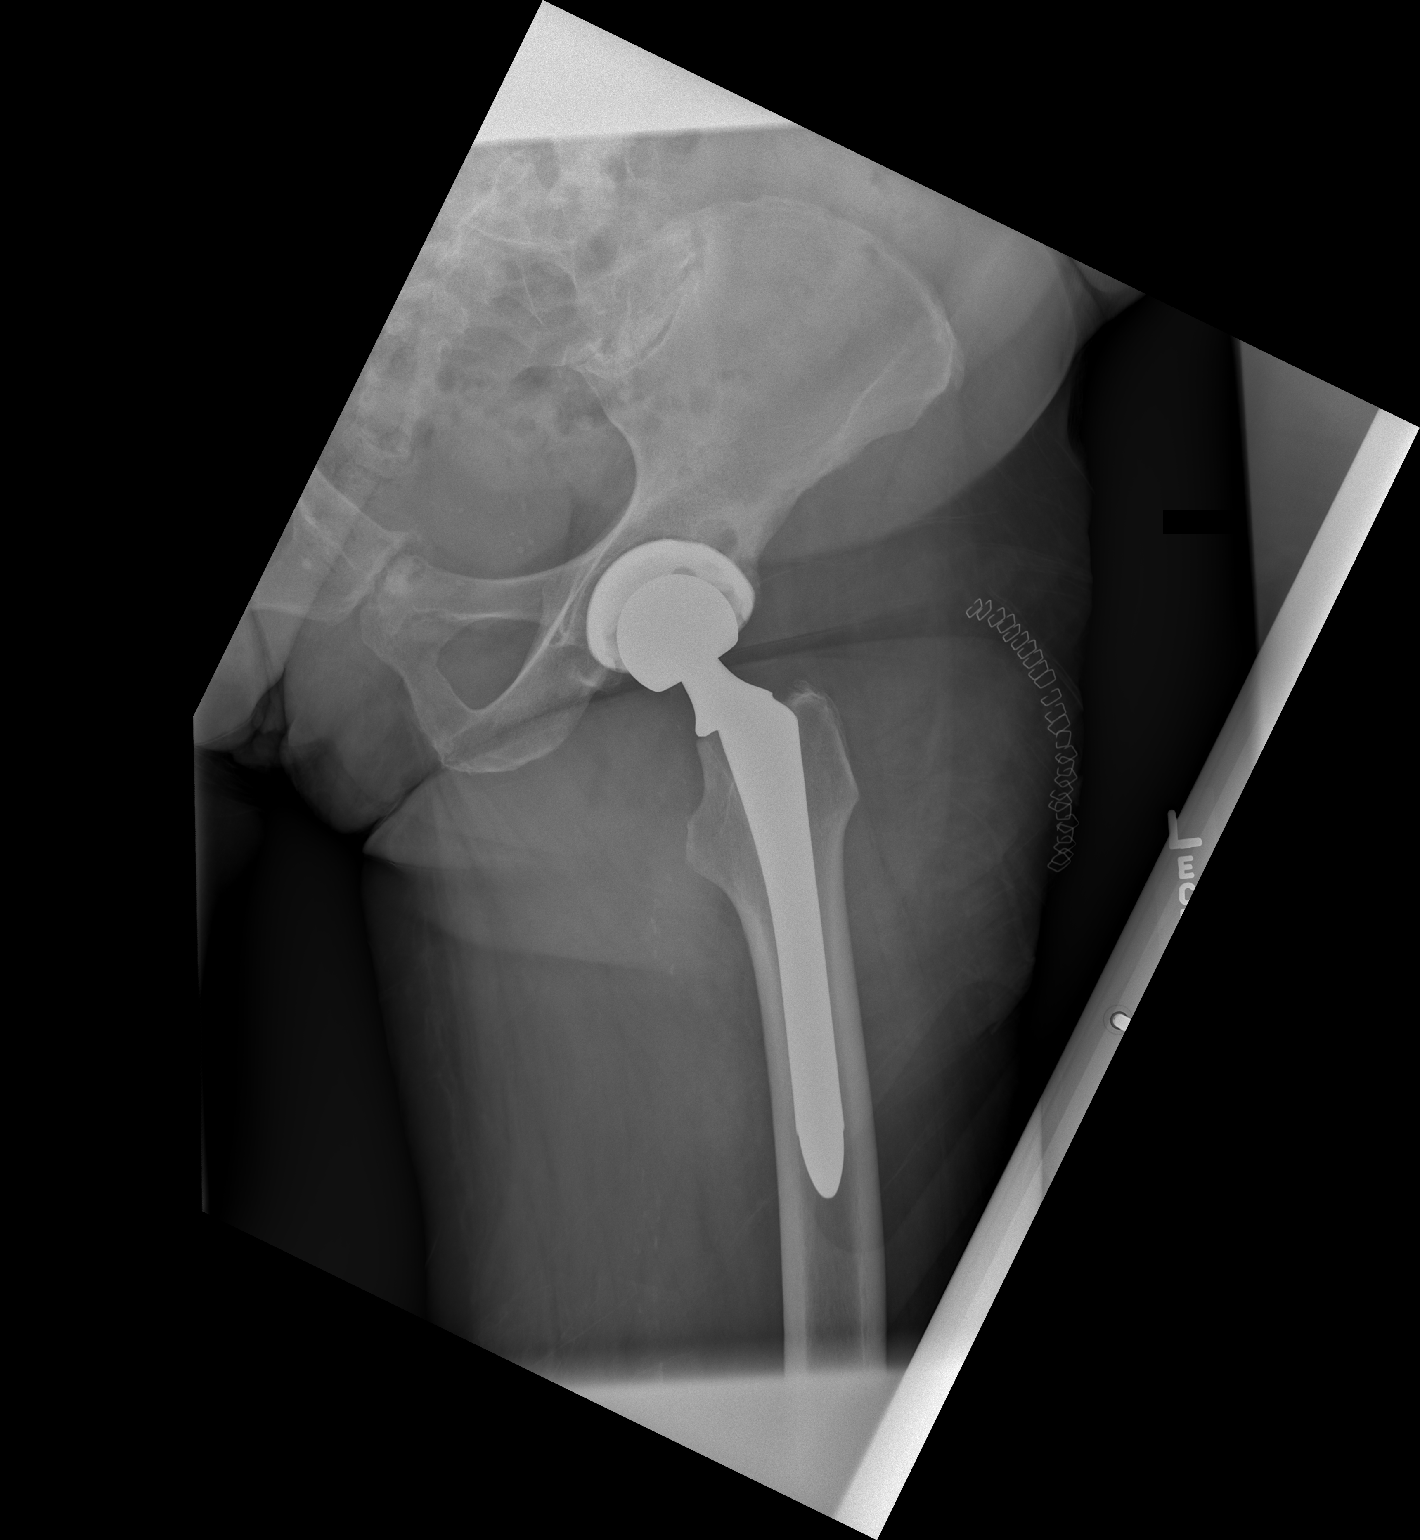

[3 of 3 positions shown; findings below may reference images not displayed]

FINDINGS: LEFT total hip arthroplasty is redemonstrated. Satisfactory position
and alignment. Overlying skin clips. No adjacent pelvic pathology.
No adjacent fracture. Atherosclerotic disease. Status post RIGHT
THA.
IMPRESSION: No adverse features related to recent LEFT total hip arthroplasty.

## 2018-10-06 ENCOUNTER — Encounter (HOSPITAL_COMMUNITY): Payer: Self-pay | Admitting: Emergency Medicine

## 2018-10-06 ENCOUNTER — Emergency Department (HOSPITAL_COMMUNITY): Payer: Medicare HMO

## 2018-10-06 ENCOUNTER — Emergency Department (HOSPITAL_COMMUNITY)
Admission: EM | Admit: 2018-10-06 | Discharge: 2018-10-06 | Disposition: A | Discharge status: Home / Self Care | Payer: Medicare HMO | Attending: Emergency Medicine | Admitting: Emergency Medicine

## 2018-10-06 ENCOUNTER — Other Ambulatory Visit: Payer: Self-pay

## 2018-10-06 DIAGNOSIS — Z96643 Presence of artificial hip joint, bilateral: Secondary | ICD-10-CM | POA: Insufficient documentation

## 2018-10-06 DIAGNOSIS — K5900 Constipation, unspecified: Secondary | ICD-10-CM | POA: Diagnosis not present

## 2018-10-06 DIAGNOSIS — F17210 Nicotine dependence, cigarettes, uncomplicated: Secondary | ICD-10-CM | POA: Diagnosis not present

## 2018-10-06 DIAGNOSIS — E876 Hypokalemia: Secondary | ICD-10-CM | POA: Diagnosis not present

## 2018-10-06 DIAGNOSIS — I10 Essential (primary) hypertension: Secondary | ICD-10-CM | POA: Insufficient documentation

## 2018-10-06 DIAGNOSIS — R1084 Generalized abdominal pain: Secondary | ICD-10-CM | POA: Diagnosis present

## 2018-10-06 DIAGNOSIS — Z79899 Other long term (current) drug therapy: Secondary | ICD-10-CM | POA: Insufficient documentation

## 2018-10-06 DIAGNOSIS — R1032 Left lower quadrant pain: Secondary | ICD-10-CM | POA: Insufficient documentation

## 2018-10-06 DIAGNOSIS — R1030 Lower abdominal pain, unspecified: Secondary | ICD-10-CM

## 2018-10-06 LAB — CBC
HCT: 42.5 % (ref 36.0–46.0)
Hemoglobin: 13.4 g/dL (ref 12.0–15.0)
MCH: 27.1 pg (ref 26.0–34.0)
MCHC: 31.5 g/dL (ref 30.0–36.0)
MCV: 85.9 fL (ref 80.0–100.0)
Platelets: 303 10*3/uL (ref 150–400)
RBC: 4.95 MIL/uL (ref 3.87–5.11)
RDW: 13.1 % (ref 11.5–15.5)
WBC: 6.6 10*3/uL (ref 4.0–10.5)
nRBC: 0 % (ref 0.0–0.2)

## 2018-10-06 LAB — COMPREHENSIVE METABOLIC PANEL
ALT: 24 U/L (ref 0–44)
AST: 21 U/L (ref 15–41)
Albumin: 4.3 g/dL (ref 3.5–5.0)
Alkaline Phosphatase: 116 U/L (ref 38–126)
Anion gap: 13 (ref 5–15)
BUN: 10 mg/dL (ref 8–23)
CO2: 24 mmol/L (ref 22–32)
Calcium: 9.7 mg/dL (ref 8.9–10.3)
Chloride: 102 mmol/L (ref 98–111)
Creatinine, Ser: 0.92 mg/dL (ref 0.44–1.00)
GFR calc Af Amer: >60 mL/min (ref >60)
GFR calc non Af Amer: >60 mL/min (ref >60)
Glucose, Bld: 156 mg/dL — ABNORMAL HIGH (ref 70–99)
Potassium: 2.9 mmol/L — ABNORMAL LOW (ref 3.5–5.1)
Sodium: 139 mmol/L (ref 135–145)
Total Bilirubin: 0.3 mg/dL (ref 0.3–1.2)
Total Protein: 7.6 g/dL (ref 6.5–8.1)

## 2018-10-06 LAB — URINALYSIS, ROUTINE W REFLEX MICROSCOPIC
Bilirubin Urine: NEGATIVE
Glucose, UA: NEGATIVE mg/dL
Hgb urine dipstick: NEGATIVE
Ketones, ur: NEGATIVE mg/dL
Leukocytes,Ua: NEGATIVE
Nitrite: NEGATIVE
Protein, ur: NEGATIVE mg/dL
Specific Gravity, Urine: 1.016 (ref 1.005–1.030)
pH: 5 (ref 5.0–8.0)

## 2018-10-06 LAB — LIPASE, BLOOD: Lipase: 31 U/L (ref 11–51)

## 2018-10-06 MED ORDER — ONDANSETRON 4 MG PO TBDP: 4.0000 mg | ORAL_TABLET | Freq: Once | ORAL | Status: DC | PRN

## 2018-10-06 MED ORDER — ONDANSETRON HCL 4 MG/2ML IJ SOLN
4.0000 mg | Freq: Once | INTRAMUSCULAR | Status: AC
  Administered 2018-10-06: 4 mg via INTRAVENOUS
  Filled 2018-10-06: qty 2

## 2018-10-06 MED ORDER — IOHEXOL 300 MG/ML  SOLN
100.0000 mL | Freq: Once | INTRAMUSCULAR | Status: AC | PRN
  Administered 2018-10-06: 14:00:00 100 mL via INTRAVENOUS

## 2018-10-06 MED ORDER — POTASSIUM CHLORIDE CRYS ER 20 MEQ PO TBCR
20.0000 meq | EXTENDED_RELEASE_TABLET | Freq: Once | ORAL | Status: AC
  Administered 2018-10-06: 16:00:00 20 meq via ORAL
  Filled 2018-10-06: qty 1

## 2018-10-06 MED ORDER — POTASSIUM CHLORIDE CRYS ER 20 MEQ PO TBCR: 20.0000 meq | EXTENDED_RELEASE_TABLET | Freq: Two times a day (BID) | ORAL | 0 refills | Status: DC

## 2018-10-06 MED ORDER — FENTANYL CITRATE (PF) 100 MCG/2ML IJ SOLN
50.0000 ug | Freq: Once | INTRAMUSCULAR | Status: AC
  Administered 2018-10-06: 12:00:00 50 ug via INTRAVENOUS
  Filled 2018-10-06: qty 2

## 2018-10-06 NOTE — ED Provider Notes (Signed)
[facility] EMERGENCY DEPARTMENT Provider Note   CSN: [id number] Arrival date & time: 10/06/18  [id number]    History   Chief Complaint Chief Complaint  Patient presents with  . Abdominal Pain    HPI Jessica Hoover is a 73 y.o. female with a history of HTN, osteoarthritis with resultant multiple chronic areas of joint pain and h/o childhood seizures, also with a history of diverticulitis presenting with a one day history of lower abdominal cramping after eating a tomato sandwich yesterday evening.  She seeded the tomato first given her hx of diverticulitis, and also states that tomatoes sometimes causes abdominal pain for her, but couldn't resist eating one yesterday. This morning she was in her car waiting on her granddaughter who she drove to an MD appt, when she developed increased left lower abdominal pain, nausea without emesis and became hot and sweaty.  She denies vomiting, no known fever, no diarrhea.  She had a larger than normal stool early this am, formed and non bloody, prior had not had a bm in 2 days.  She feels improved currently but is still tender in her lower abdomen.  She has had no treatment prior to arrival.  She denies cp, sob, dizziness, dysuria, back pain.    The history is provided by the patient.    Past Medical History:  Diagnosis Date  . Arthritis   . Chronic back pain   . Chronic pelvic pain in female    pelvic joint and thigh  . Chronic right hip pain   . Chronic shoulder pain   . Frequency of urination   . Heart murmur   . Hypertension   . Lumbago   . Neuralgia and neuritis   . Osteoarthritis   . Pneumonia    hx of  . PONV (postoperative nausea and vomiting)   . Radiculitis   . Sciatica   . 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 S/P hip replacement, left 10/09/2016  . Essential hypertension 08/30/2016  . Primary osteoarthritis of right hip 10/28/2014  . Hypokalemia 10/28/2014  .  Primary osteoarthritis of left hip 10/26/2014  . S/P total hip arthroplasty 10/26/2014    Past Surgical History:  Procedure Laterality Date  . ABDOMINAL HYSTERECTOMY     partial  . BACK SURGERY    . CHOLECYSTECTOMY    . COLONOSCOPY W/ POLYPECTOMY    . JOINT REPLACEMENT    . TOTAL HIP ARTHROPLASTY Right 10/26/2014   Procedure: TOTAL HIP ARTHROPLASTY;  Surgeon: Valeria BatmanPeter W Whitfield, MD;  [facility];  Service: Orthopedics;  Laterality: Right;  . TOTAL HIP ARTHROPLASTY Left 10/09/2016  . TOTAL HIP ARTHROPLASTY Left 10/09/2016   Procedure: LEFT TOTAL HIP ARTHROPLASTY;  Surgeon: Valeria BatmanWhitfield, Peter W, MD;  Location: [facility] OR;  Service: Orthopedics;  Laterality: Left;     OB History   No obstetric history on file.      Home Medications    Prior to Admission medications   Medication Sig Start Date End Date Taking? Authorizing Provider  amLODipine (NORVASC) 10 MG tablet Take 10 mg by mouth daily.   Yes [provider]  ciprofloxacin (CIPRO) 500 MG tablet Take 1 tablet (500 mg total) by mouth 2 (two) times daily. Patient not taking: Reported on 10/06/2018 08/01/17   Long, Arlyss RepressJoshua G, MD  metroNIDAZOLE (FLAGYL) 500 MG tablet Take 1 tablet (500 mg total) by mouth 2 (two) times  daily. Patient not taking: Reported on 10/06/2018 08/01/17   Long, Arlyss RepressJoshua G, MD  ondansetron (ZOFRAN ODT) 4 MG disintegrating tablet Take 1 tablet (4 mg total) by mouth every 8 (eight) hours as needed for nausea or vomiting. Patient not taking: Reported on 10/06/2018 08/01/17   Long, Arlyss RepressJoshua G, MD  potassium chloride SA (K-DUR) 20 MEQ tablet Take 1 tablet (20 mEq total) by mouth 2 (two) times daily. 10/06/18   Burgess AmorIdol, Samanthia Howland, PA-C    Family History Family History  Problem Relation Age of Onset  . Hypertension Mother   . Depression Mother   . Hypertension Father   . Cancer Father     Social History Social History   Tobacco Use  . Smoking status: Current Every Day Smoker    Packs/day: 0.25    Years: 56.00    Pack  years: 14.00    Types: Cigarettes    Start date: 07/11/1965  . Smokeless tobacco: Never Used  Substance Use Topics  . Alcohol use: No    Alcohol/week: 0.0 standard drinks  . Drug use: No     Allergies   Clarithromycin, Codeine, Hydrocodone, Penicillins, Sulfa antibiotics, Dilaudid [hydromorphone hcl], and Oxycodone hcl   Review of Systems Review of Systems  Constitutional: Negative for chills and fever.  HENT: Negative for congestion.   Eyes: Negative.   Respiratory: Negative for chest tightness and shortness of breath.   Cardiovascular: Negative for chest pain.  Gastrointestinal: Positive for abdominal pain and nausea. Negative for blood in stool, diarrhea and vomiting.  Genitourinary: Negative.   Musculoskeletal: Negative for arthralgias, joint swelling and neck pain.  Skin: Negative.  Negative for rash and wound.  Neurological: Negative for dizziness, weakness, light-headedness, numbness and headaches.  Psychiatric/Behavioral: Negative.      Physical Exam Updated Vital Signs BP (!) 161/78   Pulse 70   Temp 98 F (36.7 C) (Oral)   Resp 16   Ht 5\' 8"  (1.727 m)   Wt 81.6 kg   SpO2 99%   BMI 27.37 kg/m   Physical Exam Vitals signs and nursing note reviewed.  Constitutional:      Appearance: She is well-developed.  HENT:     Head: Normocephalic and atraumatic.  Eyes:     Conjunctiva/sclera: Conjunctivae normal.  Neck:     Musculoskeletal: Normal range of motion.  Cardiovascular:     Rate and Rhythm: Normal rate and regular rhythm.     Heart sounds: Normal heart sounds.  Pulmonary:     Effort: Pulmonary effort is normal.     Breath sounds: Normal breath sounds. No wheezing.  Abdominal:     General: Abdomen is protuberant. Bowel sounds are normal.     Palpations: Abdomen is soft. There is no mass or pulsatile mass.     Tenderness: There is abdominal tenderness in the suprapubic area and left lower quadrant. There is no guarding or rebound.  Musculoskeletal:  Normal range of motion.  Skin:    General: Skin is warm and dry.  Neurological:     Mental Status: She is alert.      ED Treatments / Results  Labs (all labs ordered are listed, but only abnormal results are displayed) Labs Reviewed  COMPREHENSIVE METABOLIC PANEL - Abnormal; Notable for the following components:      Result Value   Potassium 2.9 (*)    Glucose, Bld 156 (*)    All other components within normal limits  LIPASE, BLOOD  CBC  URINALYSIS, ROUTINE W REFLEX MICROSCOPIC  EKG EKG Interpretation  Date/Time:  Monday October 06 2018 11:45:54 EDT Ventricular Rate:  73 PR Interval:    QRS Duration: 98 QT Interval:  412 QTC Calculation: 454 R Axis:   -65 Text Interpretation:  Sinus rhythm Borderline prolonged PR interval Left anterior fascicular block Abnormal R-wave progression, early transition Borderline T abnormalities, lateral leads When compared with ECG of 05/16/2015, 08/30/2016 No significant change was found Confirmed by Samuel JesterMcManus, Kathleen [phone]([id number]) on 10/06/2018 11:58:32 AM   Radiology Ct Abdomen Pelvis W Contrast  Result Date: 10/06/2018 CLINICAL DATA:  Lower abd pain onset yesterday after eating tomato sandwich, about an hour ago starting sweating and having abdominal pain and nausea. History of HTN, polypectomy, cholecystectomy, abd hysterectomy, back surgery. EXAM: CT ABDOMEN AND PELVIS WITH CONTRAST TECHNIQUE: Multidetector CT imaging of the abdomen and pelvis was performed using the standard protocol following bolus administration of intravenous contrast. CONTRAST:  100mL OMNIPAQUE IOHEXOL 300 MG/ML  SOLN COMPARISON:  08/01/2017 FINDINGS: Lower chest: No acute abnormality. Hepatobiliary: Liver is borderline enlarged. It demonstrates diffuse decreased attenuation consistent with fatty infiltration. No mass or focal lesion. Status post cholecystectomy. No bile duct dilation. Pancreas: Unremarkable. No pancreatic ductal dilatation or surrounding inflammatory changes.  Spleen: Normal in size without focal abnormality. Adrenals/Urinary Tract: No adrenal masses. Kidneys normal size, orientation and position with symmetric enhancement and excretion. No renal masses, collecting system stones or hydronephrosis. Ureters are normal in course and in caliber. Bladder is unremarkable. Stomach/Bowel: Normal stomach and small bowel. There are numerous diverticula throughout the colon, predominantly along the sigmoid, but no evidence of diverticulitis. No colonic dilation or wall thickening or adjacent inflammation. Normal appendix visualized. Vascular/Lymphatic: Aortic atherosclerosis. No enlarged lymph nodes. Reproductive: Status post hysterectomy. No adnexal masses. Other: No abdominal wall hernia or abnormality. No abdominopelvic ascites. Musculoskeletal: Bilateral total hip arthroplasties, which appear well seated and aligned. No fracture or acute finding. No osteoblastic or osteolytic lesions. IMPRESSION: 1. No acute findings within the abdomen or pelvis. 2. Pancolonic diverticulosis with numerous sigmoid colon diverticula, but no evidence of diverticulitis or other bowel inflammatory process. 3. Hepatic steatosis and borderline hepatomegaly. 4. Aortic atherosclerosis. Electronically Signed   By: Amie Portlandavid  Ormond M.D.   On: 10/06/2018 13:55    Procedures Procedures (including critical care time)  Medications Ordered in ED Medications  fentaNYL (SUBLIMAZE) injection 50 mcg (50 mcg Intravenous Given 10/06/18 1151)  ondansetron (ZOFRAN) injection 4 mg (4 mg Intravenous Given 10/06/18 1151)  iohexol (OMNIPAQUE) 300 MG/ML solution 100 mL (100 mLs Intravenous Contrast Given 10/06/18 1331)  potassium chloride SA (K-DUR) CR tablet 20 mEq (20 mEq Oral Given 10/06/18 1544)     Initial Impression / Assessment and Plan / ED Course  I have reviewed the triage vital signs and the nursing notes.  Pertinent labs & imaging results that were available during my care of the patient were reviewed  by me and considered in my medical decision making (see chart for details).        Imaging and labs reviewed and discussed with pt. With no acute findings on Ct imaging, including no diverticulitis, no ileus/obstruction or other emergent process. She is hypokalemic, supplementation provided. At re-examination pt endorses has had similar sx with certain foods, including tomatoes.  Suggested she may want to avoid eating known triggers.  Discussed stool softener use which she has and uses prn.  She was sx free at time of dc.  Final Clinical Impressions(s) / ED Diagnoses   Final diagnoses:  Lower abdominal pain  Constipation, unspecified constipation type  Hypokalemia    ED Discharge Orders         Ordered    potassium chloride SA (K-DUR) 20 MEQ tablet  2 times daily     10/06/18 1541           Evalee Jefferson, PA-C 10/08/18 [place], Sims, DO 10/10/18 1530

## 2018-10-06 NOTE — Discharge Instructions (Addendum)
As discussed your CT imaging is negative for diverticulitis or any kind of intestinal blockage.  Your potassium is low and has been supplemented, but you will need to continue taking potassium for the next week.  This has been prescribed and is waiting at your pharmacy.  I recommend starting your stool softener which will help with constipation.  Plan to see your doctor for recheck of your symptoms persist beyond the next several days, return here for any worsening symptoms.

## 2018-10-06 NOTE — ED Triage Notes (Signed)
Patient started having abdominal cramping yesterday after eating tomato sandwich, about an hour starting sweating and having abdominal pain and nausea.

## 2018-10-06 NOTE — ED Notes (Signed)
Pt was informed that we need a urine sample. Pt is trying to urinate.

## 2018-10-06 NOTE — ED Notes (Signed)
Call to [facility] , pt's granddaughter, at pt's request. Updated.

## 2019-01-21 ENCOUNTER — Ambulatory Visit: Payer: Medicare HMO | Admitting: Cardiology

## 2019-01-21 ENCOUNTER — Other Ambulatory Visit: Payer: Self-pay

## 2019-01-21 ENCOUNTER — Encounter: Payer: Self-pay | Admitting: Cardiology

## 2019-01-21 VITALS — BP 130/79 | HR 83 | Temp 96.9°F | Ht 68.0 in | Wt 177.0 lb

## 2019-01-21 DIAGNOSIS — I6523 Occlusion and stenosis of bilateral carotid arteries: Secondary | ICD-10-CM

## 2019-01-21 DIAGNOSIS — I358 Other nonrheumatic aortic valve disorders: Secondary | ICD-10-CM

## 2019-01-21 DIAGNOSIS — E782 Mixed hyperlipidemia: Secondary | ICD-10-CM

## 2019-01-21 DIAGNOSIS — R531 Weakness: Secondary | ICD-10-CM | POA: Diagnosis not present

## 2019-01-21 MED ORDER — ASPIRIN EC 81 MG PO TBEC: 81.0000 mg | DELAYED_RELEASE_TABLET | Freq: Every day | ORAL | 3 refills | Status: DC

## 2019-01-21 NOTE — Patient Instructions (Signed)
Medication Instructions:  START 81 MG ASPIRIN DAILY   Labwork: NONE  Testing/Procedures: Your physician has requested that you have a carotid duplex. This test is an ultrasound of the carotid arteries in your neck. It looks at blood flow through these arteries that supply the brain with blood. Allow one hour for this exam. There are no restrictions or special instructions.  Your physician has requested that you have an echocardiogram. Echocardiography is a painless test that uses sound waves to create images of your heart. It provides your doctor with information about the size and shape of your heart and how well your heart's chambers and valves are working. This procedure takes approximately one hour. There are no restrictions for this procedure.  You have been referred to DR. DOONQUAH (NEUROLOGY)   Follow-Up: Your physician recommends that you schedule a follow-up appointment in: 4 MONTHS    Any Other Special Instructions Will Be Listed Below (If Applicable).     If you need a refill on your cardiac medications before your next appointment, please call your pharmacy.

## 2019-01-21 NOTE — Progress Notes (Signed)
Clinical Summary Ms. Hartl is a 73 y.o.female seen today for follow up of the following medical problems.   1. Aortic sclerosis - 2017 echo aortic sclerosis by morphology, appears study was limited with Doppler evaluation of AV - no recent SOB/DOE, no chest pain    2. Hyperlipidemia - pcp note mentions poor compliance with statin - currently on lipitor  3. Carotid stenosis - carotid US 2017 mild bilateral disease, 1-39% bilateral  4. TIA - left sided numbness back in March, head to toe. Symptoms lasted about 2 hours.  - she was seen by pcp few months later - intermittent episodes since that time, last episode 3 months ago.     Past Medical History:  Diagnosis Date  . Arthritis   . Chronic back pain   . Chronic pelvic pain in female    pelvic joint and thigh  . Chronic right hip pain   . Chronic shoulder pain   . Frequency of urination   . Heart murmur   . Hypertension   . Lumbago   . Neuralgia and neuritis   . Osteoarthritis   . Pneumonia    hx of  . PONV (postoperative nausea and vomiting)   . Radiculitis   . Sciatica   . 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 Clarithromycin Shortness Of Breath  . Codeine Shortness Of Breath  . Hydrocodone Shortness Of Breath  . Penicillins Shortness Of Breath and Other (See Comments)     PATIENT HAS HAD A PCN REACTION WITH IMMEDIATE RASH, FACIAL/TONGUE/THROAT SWELLING, SOB, OR LIGHTHEADEDNESS WITH HYPOTENSION:  #  #  #  YES  #  #  #   Has patient had a PCN reaction causing severe rash involving mucus membranes or skin necrosis: No Has patient had a PCN reaction that required hospitalization: No Has patient had a PCN reaction occurring within the last 10 years: No   . Sulfa Antibiotics Shortness Of Breath  . Dilaudid [Hydromorphone Hcl] Other (See Comments)    ORAL SORES  . Oxycodone Hcl Nausea Only     Current Outpatient Medications  Medication Sig Dispense Refill  .  amLODipine (NORVASC) 10 MG tablet Take 10 mg by mouth daily.    . ciprofloxacin (CIPRO) 500 MG tablet Take 1 tablet (500 mg total) by mouth 2 (two) times daily. (Patient not taking: Reported on 10/06/2018) 14 tablet 0  . metroNIDAZOLE (FLAGYL) 500 MG tablet Take 1 tablet (500 mg total) by mouth 2 (two) times daily. (Patient not taking: Reported on 10/06/2018) 14 tablet 0  . ondansetron (ZOFRAN ODT) 4 MG disintegrating tablet Take 1 tablet (4 mg total) by mouth every 8 (eight) hours as needed for nausea or vomiting. (Patient not taking: Reported on 10/06/2018) 20 tablet 0  . potassium chloride SA (K-DUR) 20 MEQ tablet Take 1 tablet (20 mEq total) by mouth 2 (two) times daily. 14 tablet 0   No current facility-administered medications for this visit.      Past Surgical History:  Procedure Laterality Date  . ABDOMINAL HYSTERECTOMY     partial  . BACK SURGERY    . CHOLECYSTECTOMY    . COLONOSCOPY W/ POLYPECTOMY    . JOINT REPLACEMENT    . TOTAL HIP ARTHROPLASTY Right 10/26/2014   Procedure: TOTAL HIP ARTHROPLASTY;  Surgeon: Valeria Batman, MD;  Location: [facility] OR;  Service: Orthopedics;  Laterality: Right;  . TOTAL HIP ARTHROPLASTY  Left 10/09/2016  . TOTAL HIP ARTHROPLASTY Left 10/09/2016   Procedure: LEFT TOTAL HIP ARTHROPLASTY;  Surgeon: Valeria BatmanWhitfield, Peter W, MD;  Location: [facility] OR;  Service: Orthopedics;  Laterality: Left;     Allergies  Allergen Reactions  . Clarithromycin Shortness Of Breath  . Codeine Shortness Of Breath  . Hydrocodone Shortness Of Breath  . Penicillins Shortness Of Breath and Other (See Comments)     PATIENT HAS HAD A PCN REACTION WITH IMMEDIATE RASH, FACIAL/TONGUE/THROAT SWELLING, SOB, OR LIGHTHEADEDNESS WITH HYPOTENSION:  #  #  #  YES  #  #  #   Has patient had a PCN reaction causing severe rash involving mucus membranes or skin necrosis: No Has patient had a PCN reaction that required hospitalization: No Has patient had a PCN reaction occurring within the last 10  years: No   . Sulfa Antibiotics Shortness Of Breath  . Dilaudid [Hydromorphone Hcl] Other (See Comments)    ORAL SORES  . Oxycodone Hcl Nausea Only      Family History  Problem Relation Age of Onset  . Hypertension Mother   . Depression Mother   . Hypertension Father   . Cancer Father      Social History Ms. Anselm Lisnoch reports that she has been smoking cigarettes. She started smoking about 53 years ago. She has a 14.00 pack-year smoking history. She has never used smokeless tobacco. Ms. Anselm Lisnoch reports no history of alcohol use.   Review of Systems CONSTITUTIONAL: No weight loss, fever, chills, weakness or fatigue.  HEENT: Eyes: No visual loss, blurred vision, double vision or yellow sclerae.No hearing loss, sneezing, congestion, runny nose or sore throat.  SKIN: No rash or itching.  CARDIOVASCULAR: per hpi RESPIRATORY: No shortness of breath, cough or sputum.  GASTROINTESTINAL: No anorexia, nausea, vomiting or diarrhea. No abdominal pain or blood.  GENITOURINARY: No burning on urination, no polyuria NEUROLOGICAL: No headache, dizziness, syncope, paralysis, ataxia, numbness or tingling in the extremities. No change in bowel or bladder control.  MUSCULOSKELETAL: No muscle, back pain, joint pain or stiffness.  LYMPHATICS: No enlarged nodes. No history of splenectomy.  PSYCHIATRIC: No history of depression or anxiety.  ENDOCRINOLOGIC: No reports of sweating, cold or heat intolerance. No polyuria or polydipsia.  Marland Kitchen.   Physical Examination Today's Vitals   01/21/19 1037 01/21/19 1043  BP: (!) 150/81 130/79  Pulse: 83   Temp: (!) 96.9 F (36.1 C)   TempSrc: Temporal   SpO2: 98%   Weight: 177 lb (80.3 kg)   Height: 5\' 8"  (1.727 m)    Body mass index is 26.91 kg/m.  Gen: resting comfortably, no acute distress HEENT: no scleral icterus, pupils equal round and reactive, no palptable cervical adenopathy,  CV: RRR, 3/6 systolic murmur rusb, no jvd Resp: Clear to auscultation  bilaterally GI: abdomen is soft, non-tender, non-distended, normal bowel sounds, no hepatosplenomegaly MSK: extremities are warm, no edema.  Skin: warm, no rash Neuro:  no focal deficits Psych: appropriate affect   Diagnostic Studies   05/2015 echo Study Conclusions  - Left ventricle: The cavity size was normal. Wall thickness was   increased in a pattern of moderate LVH. Systolic function was   normal. The estimated ejection fraction was in the range of 60%   to 65%. Wall motion was normal; there were no regional wall   motion abnormalities. Doppler parameters are consistent with   abnormal left ventricular relaxation (grade 1 diastolic   dysfunction). - Aortic valve: Mildly thickened, moderately calcified leaflets.  Aortic valve sclerosis noted. Morphologically, there appears to   be at least mild stenosis. No hemodynamic parameters obtained. - Mitral valve: Mildly calcified annulus.    Assessment and Plan   1. Aortic sclerosis/heart murmur - pretty limited study back in 2017. Since 3 years have passed will repeat echo to see if has progressed   2. Transient left sided weakness - unclear if possible TIA, she really needs a neuro eval. Will refer to Dr Arsenio Katz - from our standpoint we will repeat her carotid US, repeat echo. EKG today shows NSR  . If neuro wants an outpatient monitor we can arrange.  - asked to start aspirin 81mg  daily ASAP, meaning picking up a bottle on her way home today  3. Hyperlipidemia - continue statin  4. Carotid stenosis - mild by 2017 study, with recent symptoms of weakness repeat study   F/u 4 months  Arnoldo Lenis, M.D..

## 2019-01-30 ENCOUNTER — Other Ambulatory Visit: Payer: Self-pay

## 2019-01-30 ENCOUNTER — Ambulatory Visit (HOSPITAL_COMMUNITY)
Admission: RE | Admit: 2019-01-30 | Discharge: 2019-01-30 | Disposition: A | Discharge status: Home / Self Care | Payer: Medicare HMO | Source: Ambulatory Visit | Attending: Cardiology | Admitting: Cardiology

## 2019-01-30 DIAGNOSIS — I358 Other nonrheumatic aortic valve disorders: Secondary | ICD-10-CM | POA: Diagnosis not present

## 2019-01-30 DIAGNOSIS — I6523 Occlusion and stenosis of bilateral carotid arteries: Secondary | ICD-10-CM | POA: Insufficient documentation

## 2019-01-30 NOTE — Progress Notes (Signed)
*  PRELIMINARY RESULTS* Echocardiogram 2D Echocardiogram has been performed.  Leavy Cella 01/30/2019, 11:48 AM

## 2019-06-15 ENCOUNTER — Encounter (HOSPITAL_COMMUNITY): Payer: Self-pay

## 2019-06-15 ENCOUNTER — Other Ambulatory Visit: Payer: Self-pay

## 2019-06-15 ENCOUNTER — Emergency Department (HOSPITAL_COMMUNITY): Payer: Medicare HMO

## 2019-06-15 ENCOUNTER — Emergency Department (HOSPITAL_COMMUNITY)
Admission: EM | Admit: 2019-06-15 | Discharge: 2019-06-15 | Disposition: A | Discharge status: Home / Self Care | Payer: Medicare HMO | Attending: Emergency Medicine | Admitting: Emergency Medicine

## 2019-06-15 DIAGNOSIS — R2241 Localized swelling, mass and lump, right lower limb: Secondary | ICD-10-CM | POA: Insufficient documentation

## 2019-06-15 DIAGNOSIS — L539 Erythematous condition, unspecified: Secondary | ICD-10-CM | POA: Insufficient documentation

## 2019-06-15 DIAGNOSIS — I10 Essential (primary) hypertension: Secondary | ICD-10-CM | POA: Diagnosis not present

## 2019-06-15 DIAGNOSIS — F17210 Nicotine dependence, cigarettes, uncomplicated: Secondary | ICD-10-CM | POA: Diagnosis not present

## 2019-06-15 DIAGNOSIS — Z96642 Presence of left artificial hip joint: Secondary | ICD-10-CM | POA: Insufficient documentation

## 2019-06-15 DIAGNOSIS — M79671 Pain in right foot: Secondary | ICD-10-CM | POA: Diagnosis present

## 2019-06-15 DIAGNOSIS — Z96641 Presence of right artificial hip joint: Secondary | ICD-10-CM | POA: Diagnosis not present

## 2019-06-15 MED ORDER — DICLOFENAC SODIUM 1 % EX GEL: 2.0000 g | Freq: Four times a day (QID) | CUTANEOUS | 0 refills | Status: DC

## 2019-06-15 MED ORDER — DOXYCYCLINE HYCLATE 100 MG PO CAPS: 100.0000 mg | ORAL_CAPSULE | Freq: Two times a day (BID) | ORAL | 0 refills | Status: AC

## 2019-06-15 NOTE — ED Provider Notes (Signed)
Emergency Department Provider Note   I have reviewed the triage vital signs and the nursing notes.   HISTORY  Chief Complaint Foot Pain   HPI Jessica Hoover is a 74 y.o. female with PMH of arthritis, back pain, HTN, sciatica, and neuralgias presents to the emergency department for evaluation of right foot pain with swelling and mild redness.  Symptoms have been progressively worsening over the past week.  No known injury to the foot.  Patient describes taking tramadol and using Voltaren on the area with only mild, temporary relief in pain.  She describes severe pain with even touching the skin.  She has noticed a small area of redness over the top of the foot.  No skin breakdown, drainage, or fever.  She does have history of bilateral hip replacements.  She tells me that 2 weeks ago she had pain in her left knee which resolved spontaneously.  Pain is severe and worse with walking on the foot. No radiation.    Past Medical History:  Diagnosis Date  . Arthritis   . Chronic back pain   . Chronic pelvic pain in female    pelvic joint and thigh  . Chronic right hip pain   . Chronic shoulder pain   . Frequency of urination   . Heart murmur   . Hypertension   . Lumbago   . Neuralgia and neuritis   . Osteoarthritis   . Pneumonia    hx of  . PONV (postoperative nausea and vomiting)   . Radiculitis   . Sciatica   . 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 S/P hip replacement, left 10/09/2016  . Essential hypertension 08/30/2016  . Primary osteoarthritis of right hip 10/28/2014  . Hypokalemia 10/28/2014  . Primary osteoarthritis of left hip 10/26/2014  . S/P total hip arthroplasty 10/26/2014    Past Surgical History:  Procedure Laterality Date  . ABDOMINAL HYSTERECTOMY     partial  . BACK SURGERY    . CHOLECYSTECTOMY    . COLONOSCOPY W/ POLYPECTOMY    . JOINT REPLACEMENT    . TOTAL HIP ARTHROPLASTY Right 10/26/2014    Procedure: TOTAL HIP ARTHROPLASTY;  Surgeon: Garald Balding, MD;  Location: [facility];  Service: Orthopedics;  Laterality: Right;  . TOTAL HIP ARTHROPLASTY Left 10/09/2016  . TOTAL HIP ARTHROPLASTY Left 10/09/2016   Procedure: LEFT TOTAL HIP ARTHROPLASTY;  Surgeon: Garald Balding, MD;  Location: [facility];  Service: Orthopedics;  Laterality: Left;    Allergies Clarithromycin, Codeine, Hydrocodone, Penicillins, Sulfa antibiotics, Dilaudid [hydromorphone hcl], and Oxycodone hcl  Family History  Problem Relation Age of Onset  . Hypertension Mother   . Depression Mother   . Hypertension Father   . Cancer Father     Social History Social History   Tobacco Use  . Smoking status: Current Every Day Smoker    Packs/day: 0.25    Years: 56.00    Pack years: 14.00    Types: Cigarettes    Start date: 07/11/1965  . Smokeless tobacco: Never Used  Substance Use Topics  . Alcohol use: No    Alcohol/week: 0.0 standard drinks  . Drug use: No    Review of Systems  Constitutional: No fever/chills Musculoskeletal: Negative for back pain. Positive right foot pain.  Skin: Mild redness to the top of the right foot with swelling.  Neurological: Negative for headaches. No weakness/numbness.  ____________________________________________   PHYSICAL EXAM:  VITAL SIGNS: ED Triage Vitals  Enc Vitals Group     BP 06/15/19 0931 (!) 160/88     Pulse Rate 06/15/19 0931 68     Resp 06/15/19 0931 18     Temp 06/15/19 0931 98 F (36.7 C)     Temp Source 06/15/19 0931 Oral     SpO2 06/15/19 0931 99 %     Weight 06/15/19 0932 173 lb (78.5 kg)     Height 06/15/19 0932 5\' 4"  (1.626 m)   Constitutional: Alert and oriented. Well appearing and in no acute distress. Eyes: Conjunctivae are normal. Head: Atraumatic. Nose: No congestion/rhinnorhea. Mouth/Throat: Mucous membranes are moist.   Neck: No stridor.   Cardiovascular: Good peripheral circulation with 2+ DP and PT pulses bilaterally.    Respiratory: Normal respiratory effort.   Gastrointestinal: No distention.  Musculoskeletal: Mild edema in the right foot compared to the left.  No appreciable ankle or calf edema.  Normal range of motion of the right ankle without joint effusion or joint erythema.  Neurologic:  Normal speech and language.  Normal sensation in the right lower extremity.  Skin:  Skin is warm, dry and intact.  Very mild area of erythema over the dorsum of the right foot without fluctuance or ulceration.  No erythema tracking up the foot, ankle, calf.   ____________________________________________  RADIOLOGY  DG Foot Complete Right  Result Date: 06/15/2019 CLINICAL DATA:  Right foot pain and swelling for 4 days. No known injury. EXAM: RIGHT FOOT COMPLETE - 3+ VIEW COMPARISON:  None. FINDINGS: The mineralization and alignment are normal. There is no evidence of acute fracture or dislocation. Moderate joint space narrowing at the 1st metatarsophalangeal joint with possible medial soft tissue swelling. No soft tissue ulceration, foreign body or definite erosive changes. The additional joint spaces are preserved. Scattered soft tissue calcifications, likely vascular. IMPRESSION: No acute osseous findings. First MTP degenerative changes with possible medial soft tissue swelling. Electronically Signed   By: 06/17/2019 M.D.   On: 06/15/2019 10:14    ____________________________________________   PROCEDURES  Procedure(s) performed:   Procedures  None  ____________________________________________   INITIAL IMPRESSION / ASSESSMENT AND PLAN / ED COURSE  Pertinent labs & imaging results that were available during my care of the patient were reviewed by me and considered in my medical decision making (see chart for details).   Patient presents to the emergency department for evaluation of right foot pain without obvious injury.  There is very mild erythema over the top of the right foot but lower suspicion  clinically for cellulitis.  No evidence to suspect septic joint.  Pain seems mainly neuropathic with severe pain even to light touch in the area.  Normal pulses in the foot.  Plan for plain film to rule out occult fracture.  Patient has follow-up with her orthopedist scheduled for April 1.   10:22 AM  Plain films reviewed with some degenerative changes at the first MTP which is consistent with the patient's area of pain.  Doubt cellulitis but I have provided a watch and wait prescription for doxycycline in case the redness worsens.  Have provided contact information for local podiatry and will have the patient call today to schedule follow-up appointment.  Patient to continue her home tramadol and Voltaren gel.  Discussed ED return precautions. ____________________________________________  FINAL CLINICAL IMPRESSION(S) / ED DIAGNOSES  Final diagnoses:  Foot pain, right    NEW OUTPATIENT MEDICATIONS STARTED DURING THIS VISIT:  New Prescriptions   DICLOFENAC SODIUM (VOLTAREN) 1 % GEL    Apply 2 g topically 4 (four) times daily.   DOXYCYCLINE (VIBRAMYCIN) 100 MG CAPSULE    Take 1 capsule (100 mg total) by mouth 2 (two) times daily for 7 days.    Note:  This document was prepared using Dragon voice recognition software and may include unintentional dictation errors.  Alona Bene, MD, [facility] Emergency Medicine    Burley Kopka, Arlyss Repress, MD 06/15/19 [phone]

## 2019-06-15 NOTE — Discharge Instructions (Signed)
You were seen in the emergency department today with right foot pain.  Please continue the Voltaren gel and take your tramadol as needed for severe pain.  I have provided the contact information for a podiatrist locally who you should call today to schedule the next available follow-up appointment.  I have prescribed a antibiotic to start if the redness on the top of your foot spreads or worsens.  You should also start this if you develop fever.  If you develop numbness, color change to the foot, or other sudden worsening symptoms you should return to the emergency department.

## 2019-06-15 NOTE — ED Triage Notes (Signed)
Pt reports pain in r foot and ankle since THrusday night.  Denies injury.  Pedal pulse present. Pt has small red patchy area on top of r foot that itches.

## 2019-06-17 ENCOUNTER — Ambulatory Visit (INDEPENDENT_AMBULATORY_CARE_PROVIDER_SITE_OTHER): Payer: Medicare HMO | Admitting: Orthopaedic Surgery

## 2019-06-17 ENCOUNTER — Encounter: Payer: Self-pay | Admitting: Orthopaedic Surgery

## 2019-06-17 DIAGNOSIS — M79671 Pain in right foot: Secondary | ICD-10-CM

## 2019-06-17 NOTE — Progress Notes (Signed)
Office Visit Note   Patient: Jessica Hoover           Date of Birth: January 22, 1946           MRN: [id number] Visit Date: 06/17/2019              Requested by: Vidal Schwalbe, MD [address] [place],  [place] [address] PCP: Vidal Schwalbe, MD   Assessment & Plan: Visit Diagnoses:  1. Pain in right foot     Plan: Jessica Hoover experience rather acute onset of right dorsal foot pain several days ago.  She developed swelling and even a "blister" on the top of her foot.  She went to the [facility] emergency room 1 day and x-rays did not reveal any acute changes.  There were some degenerative changes at the first metatarsal phalangeal joint.  She was given doxycycline in case she had an infection and tramadol but notes that it did not really help.  She presently is having less pain and less swelling but still having some discomfort in the lateral aspect of her foot.  Very mild swelling by exam today but no blister formation.  No pain over the first metatarsal phalangeal joint.  All of her discomfort is along the lateral aspect of her foot dorsally but I did not see anything by x-ray or by exam.  We will try wooden shoe and see how she does over the next several weeks.  Could have had an insect bite with a blister and the itching  Follow-Up Instructions: Return in about 2 weeks (around 07/01/2019).   Orders:  No orders of the defined types were placed in this encounter.  No orders of the defined types were placed in this encounter.     Procedures: No procedures performed   Clinical Data: No additional findings.   Subjective: Chief Complaint  Patient presents with  . Right Foot - Pain  Patient presents today for right foot pain. She said that her foot started to itch and swell 6 days ago. She also noticed a large blister on the top of her foot. She has been having pain all throughout her foot. She went to [facility] ED two days ago and had x-rays taken. She was given Doxy to take. She has  tried Tramadol and Voltaren gel, but that does not help.   HPI  Review of Systems   Objective: Vital Signs: Ht 5\' 4"  (1.626 m)   Wt 173 lb (78.5 kg)   BMI 29.70 kg/m   Physical Exam Constitutional:      Appearance: She is well-developed.  Eyes:     Pupils: Pupils are equal, round, and reactive to light.  Pulmonary:     Effort: Pulmonary effort is normal.  Skin:    General: Skin is warm and dry.  Neurological:     Mental Status: She is alert and oriented to person, place, and time.  Psychiatric:        Behavior: Behavior normal.     Ortho Exam right foot had minimal edema in the mid dorsum of the foot.  There is some limited motion at the first metatarsal phalangeal joint consistent with hallux rigidus but no pain.  There was some skin discomfort and pain along the mid dorsum of her foot laterally in the area of the fifth metatarsal phalangeal joint.  X-rays were negative.  Toes were not swollen and good capillary refill.  No pain about the ankle or Achilles.  Skin  intact.  No blistering  Specialty Comments:  No specialty comments available.  Imaging: No results found.   PMFS History: Patient Active Problem List   Diagnosis Date Noted  . Pain in right foot 06/17/2019  . S/P hip replacement, left 10/09/2016  . Essential hypertension 08/30/2016  . Primary osteoarthritis of right hip 10/28/2014  . Hypokalemia 10/28/2014  . Primary osteoarthritis of left hip 10/26/2014  . S/P total hip arthroplasty 10/26/2014   Past Medical History:  Diagnosis Date  . Arthritis   . Chronic back pain   . Chronic pelvic pain in female    pelvic joint and thigh  . Chronic right hip pain   . Chronic shoulder pain   . Frequency of urination   . Heart murmur   . Hypertension   . Lumbago   . Neuralgia and neuritis   . Osteoarthritis   . Pneumonia    hx of  . PONV (postoperative nausea and vomiting)   . Radiculitis   . Sciatica   . Seizures ([facility])    had seizures as a child      Family History  Problem Relation Age of Onset  . Hypertension Mother   . Depression Mother   . Hypertension Father   . Cancer Father     Past Surgical History:  Procedure Laterality Date  . ABDOMINAL HYSTERECTOMY     partial  . BACK SURGERY    . CHOLECYSTECTOMY    . COLONOSCOPY W/ POLYPECTOMY    . JOINT REPLACEMENT    . TOTAL HIP ARTHROPLASTY Right 10/26/2014   Procedure: TOTAL HIP ARTHROPLASTY;  Surgeon: Valeria Batman, MD;  Location[facility];  Service: Orthopedics;  Laterality: Right;  . TOTAL HIP ARTHROPLASTY Left 10/09/2016  . TOTAL HIP ARTHROPLASTY Left 10/09/2016   Procedure: LEFT TOTAL HIP ARTHROPLASTY;  Surgeon: Valeria Batman, MD;  Location: [facility] OR;  Service: Orthopedics;  Laterality: Left;   Social History   Occupational History  . Not on file  Tobacco Use  . Smoking status: Current Every Day Smoker    Packs/day: 0.25    Years: 56.00    Pack years: 14.00    Types: Cigarettes    Start date: 07/11/1965  . Smokeless tobacco: Never Used  Substance and Sexual Activity  . Alcohol use: No    Alcohol/week: 0.0 standard drinks  . Drug use: No  . Sexual activity: Not Currently

## 2019-07-07 ENCOUNTER — Telehealth: Payer: Self-pay

## 2019-07-07 NOTE — Telephone Encounter (Signed)
  Patient Consent for Virtual Visit         Jessica Hoover has provided verbal consent on 07/07/2019 for a virtual visit (video or telephone).   CONSENT FOR VIRTUAL VISIT FOR:  Jessica Hoover  By participating in this virtual visit I agree to the following:  I hereby voluntarily request, consent and authorize [facility] and its employed or contracted physicians, physician assistants, nurse practitioners or other licensed health care professionals (the Practitioner), to provide me with telemedicine health care services (the "Services") as deemed necessary by the treating Practitioner. I acknowledge and consent to receive the Services by the Practitioner via telemedicine. I understand that the telemedicine visit will involve communicating with the Practitioner through live audiovisual communication technology and the disclosure of certain medical information by electronic transmission. I acknowledge that I have been given the opportunity to request an in-person assessment or other available alternative prior to the telemedicine visit and am voluntarily participating in the telemedicine visit.  I understand that I have the right to withhold or withdraw my consent to the use of telemedicine in the course of my care at any time, without affecting my right to future care or treatment, and that the Practitioner or I may terminate the telemedicine visit at any time. I understand that I have the right to inspect all information obtained and/or recorded in the course of the telemedicine visit and may receive copies of available information for a reasonable fee.  I understand that some of the potential risks of receiving the Services via telemedicine include:  Marland Kitchen Delay or interruption in medical evaluation due to technological equipment failure or disruption; . Information transmitted may not be sufficient (e.g. poor resolution of images) to allow for appropriate medical decision making by the Practitioner; and/or   . In rare instances, security protocols could fail, causing a breach of personal health information.  Furthermore, I acknowledge that it is my responsibility to provide information about my medical history, conditions and care that is complete and accurate to the best of my ability. I acknowledge that Practitioner's advice, recommendations, and/or decision may be based on factors not within their control, such as incomplete or inaccurate data provided by me or distortions of diagnostic images or specimens that may result from electronic transmissions. I understand that the practice of medicine is not an exact science and that Practitioner makes no warranties or guarantees regarding treatment outcomes. I acknowledge that a copy of this consent can be made available to me via my patient portal [facility] MyChart), or I can request a printed copy by calling the office of [facility].    I understand that my insurance will be billed for this visit.   I have read or had this consent read to me. . I understand the contents of this consent, which adequately explains the benefits and risks of the Services being provided via telemedicine.  . I have been provided ample opportunity to ask questions regarding this consent and the Services and have had my questions answered to my satisfaction. . I give my informed consent for the services to be provided through the use of telemedicine in my medical care

## 2019-07-15 ENCOUNTER — Telehealth (INDEPENDENT_AMBULATORY_CARE_PROVIDER_SITE_OTHER): Payer: Medicare HMO | Admitting: Cardiology

## 2019-07-15 ENCOUNTER — Encounter: Payer: Self-pay | Admitting: Cardiology

## 2019-07-15 VITALS — BP 147/80 | HR 83 | Ht 64.0 in | Wt 174.0 lb

## 2019-07-15 DIAGNOSIS — I10 Essential (primary) hypertension: Secondary | ICD-10-CM | POA: Diagnosis not present

## 2019-07-15 DIAGNOSIS — E782 Mixed hyperlipidemia: Secondary | ICD-10-CM | POA: Diagnosis not present

## 2019-07-15 DIAGNOSIS — R011 Cardiac murmur, unspecified: Secondary | ICD-10-CM | POA: Diagnosis not present

## 2019-07-15 DIAGNOSIS — I358 Other nonrheumatic aortic valve disorders: Secondary | ICD-10-CM

## 2019-07-15 NOTE — Patient Instructions (Signed)
Medication Instructions:  Your physician recommends that you continue on your current medications as directed. Please refer to the Current Medication list given to you today.   Labwork: I WILL REQUEST LABS FROM PCP  Testing/Procedures: NONE  Follow-Up: Your physician recommends that you schedule a follow-up appointment in: 6 MONTHS    Any Other Special Instructions Will Be Listed Below (If Applicable).  PLEASE KEEP BLOOD PRESSURE LOG FOR 1 WEEK, AND EITHER CALL OFFICE OR DROP IT OFF FOR DR. BRANCH TO REVIEW   If you need a refill on your cardiac medications before your next appointment, please call your pharmacy.

## 2019-07-15 NOTE — Progress Notes (Signed)
Virtual Visit via Telephone Note   This visit type was conducted due to national recommendations for restrictions regarding the COVID-19 Pandemic (e.g. social distancing) in an effort to limit this patient's exposure and mitigate transmission in our community.  Due to her co-morbid illnesses, this patient is at least at moderate risk for complications without adequate follow up.  This format is felt to be most appropriate for this patient at this time.  The patient did not have access to video technology/had technical difficulties with video requiring transitioning to audio format only (telephone).  All issues noted in this document were discussed and addressed.  No physical exam could be performed with this format.  Please refer to the patient's chart for her  consent to telehealth for [facility].   The patient was identified using 2 identifiers.  Date:  07/15/2019   ID:  Jessica Hoover, DOB 04/23/1945, MRN [id number]  Patient Location: Home Provider Location: Office  PCP:  [name], MD  Cardiologist:  Dina Rich, MD  Electrophysiologist:  None   Evaluation Performed:  Follow-Up Visit  Chief Complaint:  Follow up visit  History of Present Illness:    Jessica Hoover is a 74 y.o. female seen today for follow up of the following medical problems.   1. 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    2. Hyperlipidemia - compliant with meds  3. Carotid stenosis - 01/2019 mild disease bilaterally  4. TIA - left sided numbness back in March, head to toe. Symptoms lasted about 2 hours.  - she was seen by pcp few months later - intermittent episodes since that time, last episode 3 months ago.   5. HTN - had not taken meds this AM - home bp's typically 140s/70s first thing AM.     Has not had covid  vaccine, reports recent allergy issues she is awaiting to resolve   The patient does not have symptoms concerning for COVID-19 infection (fever, chills, cough, or new shortness of breath).    Past Medical History:  Diagnosis Date  . Arthritis   . Chronic back pain   . Chronic pelvic pain in female    pelvic joint and thigh  . Chronic right hip pain   . Chronic shoulder pain   . Frequency of urination   . Heart murmur   . Hypertension   . Lumbago   . Neuralgia and neuritis   . Osteoarthritis   . Pneumonia    hx of  . PONV (postoperative nausea and vomiting)   . Radiculitis   . Sciatica   . Seizures (HCC)    had seizures as a child   Past Surgical History:  Procedure Laterality Date  . ABDOMINAL HYSTERECTOMY     partial  . BACK SURGERY    . CHOLECYSTECTOMY    . COLONOSCOPY W/ POLYPECTOMY    . JOINT REPLACEMENT    . TOTAL HIP ARTHROPLASTY Right 10/26/2014   Procedure: TOTAL HIP ARTHROPLASTY;  Surgeon: Valeria Batman, MD;  Location: [facility] OR;  Service: Orthopedics;  Laterality: Right;  . TOTAL HIP ARTHROPLASTY Left 10/09/2016  . TOTAL HIP ARTHROPLASTY Left 10/09/2016   Procedure: LEFT TOTAL HIP ARTHROPLASTY;  Surgeon: Valeria Batman, MD;  Location: [facility] OR;  Service: Orthopedics;  Laterality: Left;     No outpatient medications have been  marked as taking for the 07/15/19 encounter (Appointment) with Antoine Poche, MD.     Allergies:   Clarithromycin, Codeine, Hydrocodone, Penicillins, Sulfa antibiotics, Dilaudid [hydromorphone hcl], and Oxycodone hcl   Social History   Tobacco Use  . Smoking status: Current Every Day Smoker    Packs/day: 0.25    Years: 56.00    Pack years: 14.00    Types: Cigarettes    Start date: 07/11/1965  . Smokeless tobacco: Never Used  Substance Use Topics  . Alcohol use: No    Alcohol/week: 0.0 standard drinks  . Drug use: No     Family Hx: The patient's family history includes Cancer in her father; Depression in her mother;  Hypertension in her father and mother.  ROS:   Please see the history of present illness.     All other systems reviewed and are negative.   Prior CV studies:   The following studies were reviewed today:  05/2015 echo Study Conclusions  - Left ventricle: The cavity size was normal. Wall thickness was increased in a pattern of moderate LVH. Systolic function was normal. The estimated ejection fraction was in the range of 60% to 65%. Wall motion was normal; there were no regional wall motion abnormalities. Doppler parameters are consistent with abnormal left ventricular relaxation (grade 1 diastolic dysfunction). - Aortic valve: Mildly thickened, moderately calcified leaflets. Aortic valve sclerosis noted. Morphologically, there appears to be at least mild stenosis. No hemodynamic parameters obtained. - Mitral valve: Mildly calcified annulus.   01/2019 echo IMPRESSIONS    1. Left ventricular ejection fraction, by visual estimation, is 60 to  65%. The left ventricle has normal function. There is mildly increased  left ventricular hypertrophy.  2. Left ventricular diastolic parameters are consistent with Grade I  diastolic dysfunction (impaired relaxation).  3. Global right ventricle has normal systolic function.The right  ventricular size is normal. No increase in right ventricular wall  thickness.  4. Left atrial size was normal.  5. Right atrial size was normal.  6. Mild aortic valve annular calcification.  7. Mild mitral annular calcification.  8. The mitral valve is grossly normal. Trace mitral valve regurgitation.  9. The tricuspid valve is grossly normal. Tricuspid valve regurgitation  is trivial.  10. The aortic valve is tricuspid. Aortic valve regurgitation is not  visualized. Mild to moderate aortic valve sclerosis/calcification without  any evidence of aortic stenosis. Limited excursion of noncoronary cusp  with upper normal  transvalvular  gradients.  11. The pulmonic valve was grossly normal. Pulmonic valve regurgitation is  not visualized.  12. The inferior vena cava is normal in size with greater than 50%  respiratory variability, suggesting right atrial pressure of 3 mmHg.    01/2019 carotid US Right:  Color duplex indicates moderate heterogeneous and calcified plaque, with no hemodynamically significant stenosis by duplex criteria in the extracranial cerebrovascular circulation.  Left:  Heterogeneous and calcified plaque at the left carotid bifurcation with less than 50% stenosis by established duplex criteria. Note that the flow velocities of the left ICA were obtained from an area distal to the maximum narrowing due to the presence of anterior wall plaque with shadowing and may be underestimating the percentage of ICA stenosis. If establishing a more accurate degree of stenosis is required, cerebral angiogram should be considered, or as a second best test, CTA.      Labs/Other Tests and Data Reviewed:    EKG:  No ECG reviewed.  Recent Labs: 10/06/2018: ALT 24; BUN 10;  Creatinine, Ser 0.92; Hemoglobin 13.4; Platelets 303; Potassium 2.9; Sodium 139   Recent Lipid Panel No results found for: CHOL, TRIG, HDL, CHOLHDL, LDLCALC, LDLDIRECT  Wt Readings from Last 3 Encounters:  06/17/19 173 lb (78.5 kg)  06/15/19 173 lb (78.5 kg)  01/21/19 177 lb (80.3 kg)     Objective:    Vital Signs:   Today's Vitals   07/15/19 0810  BP: (!) 147/80  Pulse: 83  Weight: 174 lb (78.9 kg)  Height: 5\' 4"  (1.626 m)   Body mass index is 29.87 kg/m. Normal affect. Normal speech pattern and tone. Comfortable, no apparent distress. No audible signs of sob or wheezing.   ASSESSMENT & PLAN:    1. Aortic sclerosis/heart murmur - recent echo shows just aortic valve sclersosis, no significant valve dysfunction - continue to monitor.    2. Hyperlipidemia - request labs from pcp - continue  staitn  3. HTN - elevated but has not taken meds yet today. She will call in 1 week with afternoon bps', if above goal consider ACEI pending labs from pcp  COVID-19 Education: The signs and symptoms of COVID-19 were discussed with the patient and how to seek care for testing (follow up with PCP or arrange E-visit).  The importance of social distancing was discussed today.  Time:   Today, I have spent 21 minutes with the patient with telehealth technology discussing the above problems.     Medication Adjustments/Labs and Tests Ordered: Current medicines are reviewed at length with the patient today.  Concerns regarding medicines are outlined above.   Tests Ordered: No orders of the defined types were placed in this encounter.   Medication Changes: No orders of the defined types were placed in this encounter.   Follow Up:  Either In Person or Virtual in 6 month(s)  Signed, Carlyle Dolly, MD  07/15/2019 7:36 AM    [place]

## 2019-08-25 ENCOUNTER — Ambulatory Visit: Payer: Medicare HMO

## 2019-08-25 ENCOUNTER — Ambulatory Visit: Payer: Medicare HMO | Admitting: Orthopaedic Surgery

## 2019-08-25 ENCOUNTER — Telehealth: Payer: Self-pay

## 2019-08-25 ENCOUNTER — Encounter: Payer: Self-pay | Admitting: Orthopaedic Surgery

## 2019-08-25 ENCOUNTER — Other Ambulatory Visit: Payer: Self-pay

## 2019-08-25 VITALS — BP 136/90 | HR 74 | Ht 64.0 in | Wt 174.0 lb

## 2019-08-25 DIAGNOSIS — M79671 Pain in right foot: Secondary | ICD-10-CM | POA: Diagnosis not present

## 2019-08-25 DIAGNOSIS — M10071 Idiopathic gout, right ankle and foot: Secondary | ICD-10-CM

## 2019-08-25 MED ORDER — ALLOPURINOL 300 MG PO TABS: 300.0000 mg | ORAL_TABLET | Freq: Every day | ORAL | 5 refills | Status: DC

## 2019-08-25 MED ORDER — COLCHICINE 0.6 MG PO TABS: ORAL_TABLET | ORAL | 3 refills | Status: DC

## 2019-08-25 NOTE — Telephone Encounter (Signed)
Spoke with Zachary George @ [facility] about this patient's results.  She stated lab results was normal (5.1)

## 2019-08-25 NOTE — Progress Notes (Signed)
Subjective:    Patient ID: Jessica Hoover, female    DOB: [birth year], 74 y.o.   MRN: [id number]  HPI She has had pain and swelling of the right great toe MTP joint over the last month.  This past week it got very red, swollen and very, very painful.  She was seen at [facility].  She was tested for gout.  She was called Friday and told her test was negative.  She called for appointment here. We were closed yesterday for [facility].  She has no trauma.  She has no history of gout.  She has no other joint problem.  Nothing seems to help the foot.  She has a post op shoe from a problem she had in March.   Review of Systems  Constitutional: Positive for activity change.  Musculoskeletal: Positive for arthralgias, gait problem and joint swelling.  All other systems reviewed and are negative.  For Review of Systems, all other systems reviewed and are negative.  The following is a summary of the past history medically, past history surgically, known current medicines, social history and family history.  This information is gathered electronically by the computer from prior information and documentation.  I review this each visit and have found including this information at this point in the chart is beneficial and informative.   Past Medical History:  Diagnosis Date  . Arthritis   . Chronic back pain   . Chronic pelvic pain in female    pelvic joint and thigh  . Chronic right hip pain   . Chronic shoulder pain   . Frequency of urination   . Heart murmur   . Hypertension   . Lumbago   . Neuralgia and neuritis   . Osteoarthritis   . Pneumonia    hx of  . PONV (postoperative nausea and vomiting)   . Radiculitis   . Sciatica   . Seizures ([place])    had seizures as a child    Past Surgical History:  Procedure Laterality Date  . ABDOMINAL HYSTERECTOMY     partial  . BACK SURGERY    . CHOLECYSTECTOMY    . COLONOSCOPY W/ POLYPECTOMY    . JOINT REPLACEMENT    . TOTAL HIP  ARTHROPLASTY Right 10/26/2014   Procedure: TOTAL HIP ARTHROPLASTY;  Surgeon: Garald Balding, MD;  Location: [facility];  Service: Orthopedics;  Laterality: Right;  . TOTAL HIP ARTHROPLASTY Left 10/09/2016  . TOTAL HIP ARTHROPLASTY Left 10/09/2016   Procedure: LEFT TOTAL HIP ARTHROPLASTY;  Surgeon: Garald Balding, MD;  Location: [facility];  Service: Orthopedics;  Laterality: Left;    Current Outpatient Medications on File Prior to Visit  Medication Sig Dispense Refill  . amLODipine (NORVASC) 10 MG tablet Take 10 mg by mouth daily.    Marland Kitchen aspirin EC 81 MG tablet Take 1 tablet (81 mg total) by mouth daily. 90 tablet 3  . atorvastatin (LIPITOR) 40 MG tablet Take 40 mg by mouth daily.    . diclofenac Sodium (VOLTAREN) 1 % GEL Apply 2 g topically 4 (four) times daily. 50 g 0  . pioglitazone (ACTOS) 15 MG tablet Take 15 mg by mouth daily.    . traMADol (ULTRAM) 50 MG tablet Take 50 mg by mouth every 6 (six) hours as needed.     No current facility-administered medications on file prior to visit.    Social History   Socioeconomic History  . Marital status: Widowed    Spouse name:  Not on file  . Number of children: Not on file  . Years of education: Not on file  . Highest education level: Not on file  Occupational History  . Not on file  Tobacco Use  . Smoking status: Current Some Day Smoker    Packs/day: 0.25    Years: 56.00    Pack years: 14.00    Types: Cigarettes    Start date: 07/11/1965  . Smokeless tobacco: Never Used  . Tobacco comment: 3 per week  Substance and Sexual Activity  . Alcohol use: No    Alcohol/week: 0.0 standard drinks  . Drug use: No  . Sexual activity: Not Currently  Other Topics Concern  . Not on file  Social History Narrative  . Not on file   Social Determinants of Health   Financial Resource Strain:   . Difficulty of Paying Living Expenses:   Food Insecurity:   . Worried About Programme researcher, broadcasting/film/video in the Last Year:   . Barista in the Last Year:     Transportation Needs:   . Freight forwarder (Medical):   Marland Kitchen Lack of Transportation (Non-Medical):   Physical Activity:   . Days of Exercise per Week:   . Minutes of Exercise per Session:   Stress:   . Feeling of Stress :   Social Connections:   . Frequency of Communication with Friends and Family:   . Frequency of Social Gatherings with Friends and Family:   . Attends Religious Services:   . Active Member of Clubs or Organizations:   . Attends Banker Meetings:   Marland Kitchen Marital Status:   Intimate Partner Violence:   . Fear of Current or Ex-Partner:   . Emotionally Abused:   Marland Kitchen Physically Abused:   . Sexually Abused:     Family History  Problem Relation Age of Onset  . Hypertension Mother   . Depression Mother   . Hypertension Father   . Cancer Father     BP 136/90   Pulse 74   Ht 5\' 4"  (1.626 m)   Wt 174 lb (78.9 kg)   BMI 29.87 kg/m   Body mass index is 29.87 kg/m.      Objective:   Physical Exam Vitals and nursing note reviewed.  Constitutional:      Appearance: She is well-developed.  HENT:     Head: Normocephalic and atraumatic.  Eyes:     Conjunctiva/sclera: Conjunctivae normal.     Pupils: Pupils are equal, round, and reactive to light.  Cardiovascular:     Rate and Rhythm: Normal rate and regular rhythm.  Pulmonary:     Effort: Pulmonary effort is normal.  Abdominal:     Palpations: Abdomen is soft.  Musculoskeletal:     Cervical back: Normal range of motion and neck supple.       Feet:  Skin:    General: Skin is warm and dry.  Neurological:     Mental Status: She is alert and oriented to person, place, and time.     Cranial Nerves: No cranial nerve deficit.     Motor: No abnormal muscle tone.     Coordination: Coordination normal.     Deep Tendon Reflexes: Reflexes are normal and symmetric. Reflexes normal.  Psychiatric:        Behavior: Behavior normal.        Thought Content: Thought content normal.        Judgment:  Judgment normal.  X-rays were done of the right foot, reported separately.      Assessment & Plan:   Encounter Diagnoses  Name Primary?  . Pain in right foot Yes  . Acute idiopathic gout of right foot    I had the staff try to get the results of the serum uric acid but we were unable to get it, even later in the morning.  I put the chart aside to review the labs but we have not been successful at all in getting these results so far.  I have begun colchicine and allopurinol.  She cannot take prednisone.  She had significant problem with vision when she took it about a month ago.  I will see her back in one week.  Call if worse.  Call if any problem.  Precautions discussed.   Electronically Signed Darreld Mclean, MD 6/1/202111:57 AM

## 2019-09-01 ENCOUNTER — Encounter: Payer: Self-pay | Admitting: Orthopaedic Surgery

## 2019-09-01 ENCOUNTER — Ambulatory Visit: Payer: Medicare HMO | Admitting: Orthopaedic Surgery

## 2019-09-01 ENCOUNTER — Other Ambulatory Visit: Payer: Self-pay

## 2019-09-01 VITALS — BP 164/90 | HR 75 | Ht 64.0 in | Wt 174.0 lb

## 2019-09-01 DIAGNOSIS — M10071 Idiopathic gout, right ankle and foot: Secondary | ICD-10-CM | POA: Diagnosis not present

## 2019-09-01 DIAGNOSIS — M79671 Pain in right foot: Secondary | ICD-10-CM

## 2019-09-01 MED ORDER — FEBUXOSTAT 40 MG PO TABS: ORAL_TABLET | ORAL | 5 refills | Status: DC

## 2019-09-01 NOTE — Progress Notes (Signed)
Patient ID:Jessica Hoover, female DOB:1945/11/16, 74 y.o. SFK:[id number]  Chief Complaint  Patient presents with   Foot Pain    right great toe area feels much better     HPI  Jessica Hoover is a 74 y.o. female who has had pain in the right foot great toe area MTP joint.  She took the allopurinol and colchicine and her foot is much improved. However, she had reaction to the allopurinol and had to stop it.  I will call in Uloric.  Her uric acid was normal but I still think she has gout because of her positive response to the medicine.  She has little pain in the great toe now.     Body mass index is 29.87 kg/m.  ROS  Review of Systems  Constitutional: Positive for activity change.  Musculoskeletal: Positive for arthralgias, gait problem and joint swelling.  All other systems reviewed and are negative.   All other systems reviewed and are negative.  The following is a summary of the past history medically, past history surgically, known current medicines, social history and family history.  This information is gathered electronically by the computer from prior information and documentation.  I review this each visit and have found including this information at this point in the chart is beneficial and informative.    Past Medical History:  Diagnosis Date   Arthritis    Chronic back pain    Chronic pelvic pain in female    pelvic joint and thigh   Chronic right hip pain    Chronic shoulder pain    Frequency of urination    Heart murmur    Hypertension    Lumbago    Neuralgia and neuritis    Osteoarthritis    Pneumonia    hx of   PONV (postoperative nausea and vomiting)    Radiculitis    Sciatica    Seizures (HCC)    had seizures as a child    Past Surgical History:  Procedure Laterality Date   ABDOMINAL HYSTERECTOMY     partial   BACK SURGERY     CHOLECYSTECTOMY     COLONOSCOPY W/ POLYPECTOMY     JOINT REPLACEMENT     TOTAL HIP ARTHROPLASTY  Right 10/26/2014   Procedure: TOTAL HIP ARTHROPLASTY;  Surgeon: Valeria Batman, MD;  Location: [facility] OR;  Service: Orthopedics;  Laterality: Right;   TOTAL HIP ARTHROPLASTY Left 10/09/2016   TOTAL HIP ARTHROPLASTY Left 10/09/2016   Procedure: LEFT TOTAL HIP ARTHROPLASTY;  Surgeon: Valeria Batman, MD;  Location: [facility] OR;  Service: Orthopedics;  Laterality: Left;    Family History  Problem Relation Age of Onset   Hypertension Mother    Depression Mother    Hypertension Father    Cancer Father     Social History Social History   Tobacco Use   Smoking status: Current Some Day Smoker    Packs/day: 0.25    Years: 56.00    Pack years: 14.00    Types: Cigarettes    Start date: 07/11/1965   Smokeless tobacco: Never Used   Tobacco comment: 3 per week  Substance Use Topics   Alcohol use: No    Alcohol/week: 0.0 standard drinks   Drug use: No    Allergies  Allergen Reactions   Clarithromycin Shortness Of Breath   Codeine Shortness Of Breath   Hydrocodone Shortness Of Breath   Penicillins Shortness Of Breath and Other (See Comments)     PATIENT HAS HAD  A PCN REACTION WITH IMMEDIATE RASH, FACIAL/TONGUE/THROAT SWELLING, SOB, OR LIGHTHEADEDNESS WITH HYPOTENSION:  #  #  #  YES  #  #  #   Has patient had a PCN reaction causing severe rash involving mucus membranes or skin necrosis: No Has patient had a PCN reaction that required hospitalization: No Has patient had a PCN reaction occurring within the last 10 years: No    Sulfa Antibiotics Shortness Of Breath   Dilaudid [Hydromorphone Hcl] Other (See Comments)    ORAL SORES   Oxycodone Hcl Nausea Only    Current Outpatient Medications  Medication Sig Dispense Refill   allopurinol (ZYLOPRIM) 300 MG tablet Take 1 tablet (300 mg total) by mouth daily. 30 tablet 5   amLODipine (NORVASC) 10 MG tablet Take 10 mg by mouth daily.     aspirin EC 81 MG tablet Take 1 tablet (81 mg total) by mouth daily. 90 tablet 3    atorvastatin (LIPITOR) 40 MG tablet Take 40 mg by mouth daily.     diclofenac Sodium (VOLTAREN) 1 % GEL Apply 2 g topically 4 (four) times daily. 50 g 0   pioglitazone (ACTOS) 15 MG tablet Take 15 mg by mouth daily.     traMADol (ULTRAM) 50 MG tablet Take 50 mg by mouth every 6 (six) hours as needed.     colchicine 0.6 MG tablet One by mouth three times a day for five days for gout pain. (Patient not taking: Reported on 09/01/2019) 15 tablet 3   febuxostat (ULORIC) 40 MG tablet One daily for gout 30 tablet 5   No current facility-administered medications for this visit.     Physical Exam  Blood pressure (!) 164/90, pulse 75, height 5\' 4"  (1.626 m), weight 174 lb (78.9 kg).  Constitutional: overall normal hygiene, normal nutrition, well developed, normal grooming, normal body habitus. Assistive device:none  Musculoskeletal: gait and station Limp none, muscle tone and strength are normal, no tremors or atrophy is present.  .  Neurological: coordination overall normal.  Deep tendon reflex/nerve stretch intact.  Sensation normal.  Cranial nerves II-XII intact.   Skin:   Normal overall no scars, lesions, ulcers or rashes. No psoriasis.  Psychiatric: Alert and oriented x 3.  Recent memory intact, remote memory unclear.  Normal mood and affect. Well groomed.  Good eye contact.  Cardiovascular: overall no swelling, no varicosities, no edema bilaterally, normal temperatures of the legs and arms, no clubbing, cyanosis and good capillary refill.  Lymphatic: palpation is normal.  The right great toe MTP has very slight swelling but much, much less than last time. She has no redness and no pain.  Gait is near normal.  All other systems reviewed and are negative   The patient has been educated about the nature of the problem(s) and counseled on treatment options.  The patient appeared to understand what I have discussed and is in agreement with it.  Encounter Diagnoses  Name Primary?    Acute idiopathic gout of right foot Yes   Pain in right foot     PLAN Call if any problems.  Precautions discussed.  Continue current medications. Begin Uloric.  Return to clinic 2 weeks   Electronically Signed Sanjuana Kava, MD 6/8/20218:50 AM

## 2019-09-12 ENCOUNTER — Emergency Department (HOSPITAL_COMMUNITY)
Admission: EM | Admit: 2019-09-12 | Discharge: 2019-09-12 | Disposition: A | Discharge status: Home / Self Care | Payer: Medicare HMO | Attending: Emergency Medicine | Admitting: Emergency Medicine

## 2019-09-12 ENCOUNTER — Emergency Department (HOSPITAL_COMMUNITY): Payer: Medicare HMO

## 2019-09-12 ENCOUNTER — Encounter (HOSPITAL_COMMUNITY): Payer: Self-pay | Admitting: Emergency Medicine

## 2019-09-12 ENCOUNTER — Other Ambulatory Visit: Payer: Self-pay

## 2019-09-12 DIAGNOSIS — Z79899 Other long term (current) drug therapy: Secondary | ICD-10-CM | POA: Insufficient documentation

## 2019-09-12 DIAGNOSIS — M79674 Pain in right toe(s): Secondary | ICD-10-CM

## 2019-09-12 DIAGNOSIS — Z7982 Long term (current) use of aspirin: Secondary | ICD-10-CM | POA: Diagnosis not present

## 2019-09-12 DIAGNOSIS — Z880 Allergy status to penicillin: Secondary | ICD-10-CM | POA: Insufficient documentation

## 2019-09-12 DIAGNOSIS — I10 Essential (primary) hypertension: Secondary | ICD-10-CM | POA: Insufficient documentation

## 2019-09-12 DIAGNOSIS — G8929 Other chronic pain: Secondary | ICD-10-CM | POA: Diagnosis not present

## 2019-09-12 DIAGNOSIS — F17210 Nicotine dependence, cigarettes, uncomplicated: Secondary | ICD-10-CM | POA: Insufficient documentation

## 2019-09-12 DIAGNOSIS — Z888 Allergy status to other drugs, medicaments and biological substances status: Secondary | ICD-10-CM | POA: Diagnosis not present

## 2019-09-12 DIAGNOSIS — Z881 Allergy status to other antibiotic agents status: Secondary | ICD-10-CM | POA: Insufficient documentation

## 2019-09-12 MED ORDER — KETOROLAC TROMETHAMINE 30 MG/ML IJ SOLN
15.0000 mg | Freq: Once | INTRAMUSCULAR | Status: AC
  Administered 2019-09-12: 15 mg via INTRAMUSCULAR
  Filled 2019-09-12: qty 1

## 2019-09-12 NOTE — ED Triage Notes (Signed)
Reports R foot pain since "march of last year"  Here today because "I just can't stand it"  Reports pain with walking

## 2019-09-12 NOTE — ED Provider Notes (Signed)
[facility] EMERGENCY DEPARTMENT Provider Note   CSN: [id number] Arrival date & time: 09/12/19  1455    History Chief Complaint  Patient presents with  . Foot Pain    x 1 year    Jessica Hoover is a 74 y.o. female with medical history significant for sciatica, prediabetes, chronic back pain, arthritis, gout to right great toe who presents for evaluation of right toe pain.  Patient followed by orthopedics.  Patient states she has had right great toe pain x1 year.  She has been seen by Dr. Hilda Lias with orthopedics.  She has taken allopurinol and colchicine which she states her symptoms had improved however had allergic reaction allopurinol and had to stop this.  Prior normal uric acids however has had response to prior gout medications.  Was recently seen by orthopedics 11 days ago.  Allergy to prednisone.  Denies any recent injury or trauma.  Denies fever, chills, nausea, vomiting, chest pain, shortness of breath, abdominal pain, diarrhea, dysuria, paresthesias.  Patient states the swelling has significantly improved to her right great toe since starting Uloric however still persists along with erythema and tenderness.  States she has tramadol at home which she took 1 dose yesterday which resolved her pain however she does not like taking this as his medication makes her sleepy.  Rates her current pain a 9/10.  Denies aggravating relieving factors.  No drainage to toe.  History obtained from patient, past medical records.  No interpreter is used.  HPI     Past Medical History:  Diagnosis Date  . Arthritis   . Chronic back pain   . Chronic pelvic pain in female    pelvic joint and thigh  . Chronic right hip pain   . Chronic shoulder pain   . Frequency of urination   . Heart murmur   . Hypertension   . Lumbago   . Neuralgia and neuritis   . Osteoarthritis   . Pneumonia    hx of  . PONV (postoperative nausea and vomiting)   . Radiculitis   . Sciatica   . Seizures (HCC)    had  seizures as a child    Patient Active Problem List   Diagnosis Date Noted  . Pain in right foot 06/17/2019  . S/P hip replacement, left 10/09/2016  . Essential hypertension 08/30/2016  . Primary osteoarthritis of right hip 10/28/2014  . Hypokalemia 10/28/2014  . Primary osteoarthritis of left hip 10/26/2014  . S/P total hip arthroplasty 10/26/2014    Past Surgical History:  Procedure Laterality Date  . ABDOMINAL HYSTERECTOMY     partial  . BACK SURGERY    . CHOLECYSTECTOMY    . COLONOSCOPY W/ POLYPECTOMY    . JOINT REPLACEMENT    . TOTAL HIP ARTHROPLASTY Right 10/26/2014   Procedure: TOTAL HIP ARTHROPLASTY;  Surgeon: Valeria Batman, MD;  Location: [facility] OR;  Service: Orthopedics;  Laterality: Right;  . TOTAL HIP ARTHROPLASTY Left 10/09/2016  . TOTAL HIP ARTHROPLASTY Left 10/09/2016   Procedure: LEFT TOTAL HIP ARTHROPLASTY;  Surgeon: Valeria Batman, MD;  Location: [facility] OR;  Service: Orthopedics;  Laterality: Left;     OB History   No obstetric history on file.     Family History  Problem Relation Age of Onset  . Hypertension Mother   . Depression Mother   . Hypertension Father   . Cancer Father     Social History   Tobacco Use  . Smoking status: Current Some Day Smoker  Packs/day: 0.25    Years: 56.00    Pack years: 14.00    Types: Cigarettes    Start date: 07/11/1965  . Smokeless tobacco: Never Used  . Tobacco comment: 3 per week  Vaping Use  . Vaping Use: Never used  Substance Use Topics  . Alcohol use: No    Alcohol/week: 0.0 standard drinks  . Drug use: No    Home Medications Prior to Admission medications   Medication Sig Start Date End Date Taking? Authorizing Provider  allopurinol (ZYLOPRIM) 300 MG tablet Take 1 tablet (300 mg total) by mouth daily. 08/25/19   Sanjuana Kava, MD  amLODipine (NORVASC) 10 MG tablet Take 10 mg by mouth daily.    [provider]  aspirin EC 81 MG tablet Take 1 tablet (81 mg total) by mouth daily. 01/21/19    Arnoldo Lenis, MD  atorvastatin (LIPITOR) 40 MG tablet Take 40 mg by mouth daily.    [provider]  colchicine 0.6 MG tablet One by mouth three times a day for five days for gout pain. Patient not taking: Reported on 09/01/2019 08/25/19   Sanjuana Kava, MD  diclofenac Sodium (VOLTAREN) 1 % GEL Apply 2 g topically 4 (four) times daily. 06/15/19   Long, Wonda Olds, MD  febuxostat (ULORIC) 40 MG tablet One daily for gout 09/01/19   Sanjuana Kava, MD  pioglitazone (ACTOS) 15 MG tablet Take 15 mg by mouth daily. 07/02/19   [provider]  traMADol (ULTRAM) 50 MG tablet Take 50 mg by mouth every 6 (six) hours as needed. 11/08/18   [provider]    Allergies    Clarithromycin, Codeine, Hydrocodone, Penicillins, Sulfa antibiotics, Dilaudid [hydromorphone hcl], and Oxycodone hcl  Review of Systems   Review of Systems  Constitutional: Negative.   HENT: Negative.   Respiratory: Negative.   Cardiovascular: Negative.   Gastrointestinal: Negative.   Genitourinary: Negative.   Musculoskeletal:       Right great toe pain  Skin:       Erythema to right great toe  Neurological: Negative.   All other systems reviewed and are negative.   Physical Exam Updated Vital Signs BP (!) 141/71 (BP Location: Right Arm)   Pulse 73   Temp 97.7 F (36.5 C) (Temporal)   Resp 16   Ht 5\' 4"  (1.626 m)   Wt 78.9 kg   SpO2 98%   BMI 29.87 kg/m   Physical Exam Vitals and nursing note reviewed.  Constitutional:      General: She is not in acute distress.    Appearance: She is well-developed. She is not ill-appearing, toxic-appearing or diaphoretic.  HENT:     Head: Normocephalic and atraumatic.     Nose: Nose normal.     Mouth/Throat:     Mouth: Mucous membranes are moist.  Eyes:     Pupils: Pupils are equal, round, and reactive to light.  Cardiovascular:     Rate and Rhythm: Normal rate.     Pulses: Normal pulses.          Dorsalis pedis pulses are 2+ on the right side and  2+ on the left side.     Heart sounds: Normal heart sounds.  Pulmonary:     Effort: Pulmonary effort is normal. No respiratory distress.     Breath sounds: Normal breath sounds.  Abdominal:     General: Bowel sounds are normal. There is no distension.  Musculoskeletal:        General: Normal  range of motion.     Cervical back: Normal range of motion.     Comments: Diffuse tenderness to right great toe.  Tenderness does not extend into the foot.  There is some soft tissue swelling.  No toenail present.  Gross toes without difficulty.  Feet:     Right foot:     Skin integrity: Erythema present. No ulcer, blister or skin breakdown.     Left foot:     Skin integrity: No ulcer, blister or skin breakdown.     Toenail Condition: Left toenails are abnormally thick and long.  Skin:    General: Skin is warm and dry.     Comments: Erythema without warmth to right great toe.  No toenail present.  She has no fluctuance or induration.  No evidence of felon or paronychia.  Does not appear cellulitic.  Erythema does not extend into the foot.  Neurological:     General: No focal deficit present.     Mental Status: She is alert and oriented to person, place, and time.     Comments: Ambulatory that difficulty. 5/5 strength bilateral lower extremities at difficulty.    ED Results / Procedures / Treatments   Labs (all labs ordered are listed, but only abnormal results are displayed) Labs Reviewed - No data to display  EKG None  Radiology DG Foot Complete Right  Result Date: 09/12/2019 CLINICAL DATA:  Chronic right foot pain extending from the great toe to the back of the foot. EXAM: RIGHT FOOT COMPLETE - 3+ VIEW COMPARISON:  Radiographs dated 08/25/2019 FINDINGS: There is moderate arthritis of the first metatarsal phalangeal joint with a small erosion at the base of the proximal phalanx. The other bones and joints of the right foot appear normal. IMPRESSION: Osteoarthritis of the first  metatarsophalangeal joint. No change since 08/25/2019. Electronically Signed   By: Francene Boyers M.D.   On: 09/12/2019 15:39    Procedures Procedures (including critical care time)  Medications Ordered in ED Medications  ketorolac (TORADOL) 30 MG/ML injection 15 mg (15 mg Intramuscular Given 09/12/19 1606)    ED Course  I have reviewed the triage vital signs and the nursing notes.  Pertinent labs & imaging results that were available during my care of the patient were reviewed by me and considered in my medical decision making (see chart for details).  74 year old female presents for evaluation of left great toe pain.  Present x1 year.  She is followed by orthopedics for idiopathic gout.  Has tried multiple medications in the past with allergic reaction per patient.  She is currently on Uloric which she began 11 days ago from orthopedics.  States her swelling has improved.  She does have some erythema and swelling to her right great toe however erythema does not extend into the dorsum of foot.  Tenderness at base of metatarsal right great toe.  She has no toenail to this area.  She has no fluctuance or induration.  No evidence of felon or paronychia.  Symptoms seem consistent with her gout as patient states her symptoms have been chronic for 1 year.  X-ray obtained in person reviewed here in the emergency department does not show any evidence of bony destruction to suggest osteomyelitis.  No evidence of gas-forming organism.  No fracture or dislocation.  Does have some osteoarthritis.  Low suspicion for septic joint, hemarthrosis, fracture, dislocation, bacterial infectious process, myositis, vascular occlusion.  Discussed with patient symptoms likely related to her gout.  Unfortunately additional  medications I discussed with her patient states she has had allergic reaction to.  States her tramadol does resolve her pain her she does not like taking this as this makes her "sleepy."  We will give her  a shot of Toradol which she has taken previously and not had reaction to, per patient.  Labs personally reviewed, last labs without any elevation in creatinine.  Discussed with patient symptomatic management with diet changes as well as close follow-up with orthopedics.  Patient discussed with attending, Dr. Estell Harpin who agrees with the treatment, plan and disposition.  The patient has been appropriately medically screened and/or stabilized in the ED. I have low suspicion for any other emergent medical condition which would require further screening, evaluation or treatment in the ED or require inpatient management.  Patient is hemodynamically stable and in no acute distress.  Patient able to ambulate in department prior to ED.  Evaluation does not show acute pathology that would require ongoing or additional emergent interventions while in the emergency department or further inpatient treatment.  I have discussed the diagnosis with the patient and answered all questions.  Pain is been managed while in the emergency department and patient has no further complaints prior to discharge.  Patient is comfortable with plan discussed in room and is stable for discharge at this time.  I have discussed strict return precautions for returning to the emergency department.  Patient was encouraged to follow-up with PCP/specialist refer to at discharge.   MDM Rules/Calculators/A&P                           Final Clinical Impression(s) / ED Diagnoses Final diagnoses:  Toe pain, chronic, right    Rx / DC Orders ED Discharge Orders    None       Suri Tafolla A, PA-C 09/12/19 1614    Bethann Berkshire, MD 09/14/19 [phone]

## 2019-09-12 NOTE — Discharge Instructions (Signed)
Take the Uloric as well as tramadol as needed for pain.  Follow-up with orthopedics.  Return for any worsening symptoms.

## 2019-09-15 ENCOUNTER — Other Ambulatory Visit: Payer: Self-pay

## 2019-09-15 ENCOUNTER — Encounter: Payer: Self-pay | Admitting: Orthopaedic Surgery

## 2019-09-15 ENCOUNTER — Ambulatory Visit: Payer: Medicare HMO | Admitting: Orthopaedic Surgery

## 2019-09-15 VITALS — BP 159/76 | HR 75 | Ht 64.0 in | Wt 175.0 lb

## 2019-09-15 DIAGNOSIS — M10071 Idiopathic gout, right ankle and foot: Secondary | ICD-10-CM | POA: Diagnosis not present

## 2019-09-15 DIAGNOSIS — M79671 Pain in right foot: Secondary | ICD-10-CM | POA: Diagnosis not present

## 2019-09-15 MED ORDER — COLCHICINE 0.6 MG PO TABS: ORAL_TABLET | ORAL | 5 refills | Status: DC

## 2019-09-15 NOTE — Progress Notes (Signed)
Patient ID:Jessica Hoover, female DOB:Mar 19, 1946, 74 y.o. WUJ:[id number]  Chief Complaint  Patient presents with  . Gout    flared, to ER on Saturday right foot painful at great toe    HPI  Jessica Hoover is a 74 y.o. female who went to the ER for pain in the right foot from her gout.  She cannot take allopurinol or prednisone.  She has had reactions from them.  She just started Uloric.  She took colchicine only one time.  I have told her to use the colchicine for the pain.  She did not remember that.  I will refill her colchicine.  She is to take three pills daily for five days.  That should help her gout pain.  The Ultram makes her sleepy and she falls.  I told her she should not need the Ultram if the colchicine is taken properly.  I will see her in one week.  I am limited what I can give her secondary to so many reactions to medicines.   Body mass index is 30.04 kg/m.  ROS  Review of Systems  Constitutional: Positive for activity change.  Musculoskeletal: Positive for arthralgias, gait problem and joint swelling.  All other systems reviewed and are negative.   All other systems reviewed and are negative.  The following is a summary of the past history medically, past history surgically, known current medicines, social history and family history.  This information is gathered electronically by the computer from prior information and documentation.  I review this each visit and have found including this information at this point in the chart is beneficial and informative.    Past Medical History:  Diagnosis Date  . Arthritis   . Chronic back pain   . Chronic pelvic pain in female    pelvic joint and thigh  . Chronic right hip pain   . Chronic shoulder pain   . Frequency of urination   . Heart murmur   . Hypertension   . Lumbago   . Neuralgia and neuritis   . Osteoarthritis   . Pneumonia    hx of  . PONV (postoperative nausea and vomiting)   . Radiculitis   . Sciatica   .  Seizures (HCC)    had seizures as a child    Past Surgical History:  Procedure Laterality Date  . ABDOMINAL HYSTERECTOMY     partial  . BACK SURGERY    . CHOLECYSTECTOMY    . COLONOSCOPY W/ POLYPECTOMY    . JOINT REPLACEMENT    . TOTAL HIP ARTHROPLASTY Right 10/26/2014   Procedure: TOTAL HIP ARTHROPLASTY;  Surgeon: Valeria Batman, MD;  Location: [facility] OR;  Service: Orthopedics;  Laterality: Right;  . TOTAL HIP ARTHROPLASTY Left 10/09/2016  . TOTAL HIP ARTHROPLASTY Left 10/09/2016   Procedure: LEFT TOTAL HIP ARTHROPLASTY;  Surgeon: Valeria Batman, MD;  Location: [facility] OR;  Service: Orthopedics;  Laterality: Left;    Family History  Problem Relation Age of Onset  . Hypertension Mother   . Depression Mother   . Hypertension Father   . Cancer Father     Social History Social History   Tobacco Use  . Smoking status: Current Some Day Smoker    Packs/day: 0.25    Years: 56.00    Pack years: 14.00    Types: Cigarettes    Start date: 07/11/1965  . Smokeless tobacco: Never Used  . Tobacco comment: 3 per week  Vaping Use  . Vaping Use:  Never used  Substance Use Topics  . Alcohol use: No    Alcohol/week: 0.0 standard drinks  . Drug use: No    Allergies  Allergen Reactions  . Clarithromycin Shortness Of Breath  . Codeine Shortness Of Breath  . Hydrocodone Shortness Of Breath  . Penicillins Shortness Of Breath and Other (See Comments)     PATIENT HAS HAD A PCN REACTION WITH IMMEDIATE RASH, FACIAL/TONGUE/THROAT SWELLING, SOB, OR LIGHTHEADEDNESS WITH HYPOTENSION:  #  #  #  YES  #  #  #   Has patient had a PCN reaction causing severe rash involving mucus membranes or skin necrosis: No Has patient had a PCN reaction that required hospitalization: No Has patient had a PCN reaction occurring within the last 10 years: No   . Sulfa Antibiotics Shortness Of Breath  . Dilaudid [Hydromorphone Hcl] Other (See Comments)    ORAL SORES  . Oxycodone Hcl Nausea Only    Current  Outpatient Medications  Medication Sig Dispense Refill  . amLODipine (NORVASC) 10 MG tablet Take 10 mg by mouth daily.    Marland Kitchen aspirin EC 81 MG tablet Take 1 tablet (81 mg total) by mouth daily. 90 tablet 3  . atorvastatin (LIPITOR) 40 MG tablet Take 40 mg by mouth daily.    . diclofenac Sodium (VOLTAREN) 1 % GEL Apply 2 g topically 4 (four) times daily. 50 g 0  . pioglitazone (ACTOS) 15 MG tablet Take 15 mg by mouth daily.    . traMADol (ULTRAM) 50 MG tablet Take 50 mg by mouth every 6 (six) hours as needed.    Marland Kitchen allopurinol (ZYLOPRIM) 300 MG tablet Take 1 tablet (300 mg total) by mouth daily. 30 tablet 5  . colchicine 0.6 MG tablet One by mouth three times a day for five days for gout pain. 15 tablet 5  . febuxostat (ULORIC) 40 MG tablet One daily for gout 30 tablet 5   No current facility-administered medications for this visit.     Physical Exam  Blood pressure (!) 159/76, pulse 75, height 5\' 4"  (1.626 m), weight 175 lb (79.4 kg).  Constitutional: overall normal hygiene, normal nutrition, well developed, normal grooming, normal body habitus. Assistive device:none  Musculoskeletal: gait and station Limp right, muscle tone and strength are normal, no tremors or atrophy is present.  .  Neurological: coordination overall normal.  Deep tendon reflex/nerve stretch intact.  Sensation normal.  Cranial nerves II-XII intact.   Skin:   Normal overall no scars, lesions, ulcers or rashes. No psoriasis.  Psychiatric: Alert and oriented x 3.  Recent memory intact, remote memory unclear.  Normal mood and affect. Well groomed.  Good eye contact.  Cardiovascular: overall no swelling, no varicosities, no edema bilaterally, normal temperatures of the legs and arms, no clubbing, cyanosis and good capillary refill.  Lymphatic: palpation is normal.  Right great toe MTP joint has some redness and swelling and increased heat and pain.  NV intact.  ROM painful.  Limp right.  All other systems reviewed  and are negative   The patient has been educated about the nature of the problem(s) and counseled on treatment options.  The patient appeared to understand what I have discussed and is in agreement with it.  Encounter Diagnoses  Name Primary?  . Acute idiopathic gout of right foot Yes  . Pain in right foot     PLAN Call if any problems.  Precautions discussed.  Continue current medications.   Return to clinic 1 week  Electronically E[place], MD 6/22/20218:19 AM

## 2019-09-22 ENCOUNTER — Ambulatory Visit: Payer: Medicare HMO | Admitting: Orthopaedic Surgery

## 2019-09-22 ENCOUNTER — Other Ambulatory Visit: Payer: Self-pay

## 2019-09-22 ENCOUNTER — Encounter: Payer: Self-pay | Admitting: Orthopaedic Surgery

## 2019-09-22 DIAGNOSIS — M10071 Idiopathic gout, right ankle and foot: Secondary | ICD-10-CM | POA: Diagnosis not present

## 2019-09-22 DIAGNOSIS — F17210 Nicotine dependence, cigarettes, uncomplicated: Secondary | ICD-10-CM | POA: Diagnosis not present

## 2019-09-22 NOTE — Progress Notes (Signed)
Patient ID:Jessica Hoover, female DOB:[birth year], 74 y.o. TKW:[id number]  Chief Complaint  Patient presents with  . Gout    right foot feels better using Uloric now.     HPI  Jessica Hoover is a 74 y.o. female who has acute gout.  The colchicine I gave her really helped.  I have told her to continue the Uloric daily.  She cannot tolerate allopurinol or prednisone.  It will take some time for the uric acid to slowly be removed from her system.  Take the colchicine when pain begins.   There is no height or weight on file to calculate BMI.  ROS  Review of Systems  Constitutional: Positive for activity change.  Musculoskeletal: Positive for arthralgias, gait problem and joint swelling.  All other systems reviewed and are negative.   All other systems reviewed and are negative.  The following is a summary of the past history medically, past history surgically, known current medicines, social history and family history.  This information is gathered electronically by the computer from prior information and documentation.  I review this each visit and have found including this information at this point in the chart is beneficial and informative.    Past Medical History:  Diagnosis Date  . Arthritis   . Chronic back pain   . Chronic pelvic pain in female    pelvic joint and thigh  . Chronic right hip pain   . Chronic shoulder pain   . Frequency of urination   . Heart murmur   . Hypertension   . Lumbago   . Neuralgia and neuritis   . Osteoarthritis   . Pneumonia    hx of  . PONV (postoperative nausea and vomiting)   . Radiculitis   . Sciatica   . Seizures (HCC)    had seizures as a child    Past Surgical History:  Procedure Laterality Date  . ABDOMINAL HYSTERECTOMY     partial  . BACK SURGERY    . CHOLECYSTECTOMY    . COLONOSCOPY W/ POLYPECTOMY    . JOINT REPLACEMENT    . TOTAL HIP ARTHROPLASTY Right 10/26/2014   Procedure: TOTAL HIP ARTHROPLASTY;  Surgeon: Valeria Batman,  MD;  Location: [facility] OR;  Service: Orthopedics;  Laterality: Right;  . TOTAL HIP ARTHROPLASTY Left 10/09/2016  . TOTAL HIP ARTHROPLASTY Left 10/09/2016   Procedure: LEFT TOTAL HIP ARTHROPLASTY;  Surgeon: Valeria Batman, MD;  Location: [facility] OR;  Service: Orthopedics;  Laterality: Left;    Family History  Problem Relation Age of Onset  . Hypertension Mother   . Depression Mother   . Hypertension Father   . Cancer Father     Social History Social History   Tobacco Use  . Smoking status: Current Some Day Smoker    Packs/day: 0.25    Years: 56.00    Pack years: 14.00    Types: Cigarettes    Start date: 07/11/1965  . Smokeless tobacco: Never Used  . Tobacco comment: 3 per week  Vaping Use  . Vaping Use: Never used  Substance Use Topics  . Alcohol use: No    Alcohol/week: 0.0 standard drinks  . Drug use: No    Allergies  Allergen Reactions  . Clarithromycin Shortness Of Breath  . Codeine Shortness Of Breath  . Hydrocodone Shortness Of Breath  . Penicillins Shortness Of Breath and Other (See Comments)     PATIENT HAS HAD A PCN REACTION WITH IMMEDIATE RASH, FACIAL/TONGUE/THROAT SWELLING, SOB, OR LIGHTHEADEDNESS  WITH HYPOTENSION:  #  #  #  YES  #  #  #   Has patient had a PCN reaction causing severe rash involving mucus membranes or skin necrosis: No Has patient had a PCN reaction that required hospitalization: No Has patient had a PCN reaction occurring within the last 10 years: No   . Sulfa Antibiotics Shortness Of Breath  . Dilaudid [Hydromorphone Hcl] Other (See Comments)    ORAL SORES  . Oxycodone Hcl Nausea Only    Current Outpatient Medications  Medication Sig Dispense Refill  . amLODipine (NORVASC) 10 MG tablet Take 10 mg by mouth daily.    Marland Kitchen aspirin EC 81 MG tablet Take 1 tablet (81 mg total) by mouth daily. 90 tablet 3  . atorvastatin (LIPITOR) 40 MG tablet Take 40 mg by mouth daily.    . febuxostat (ULORIC) 40 MG tablet One daily for gout 30 tablet 5  .  pioglitazone (ACTOS) 15 MG tablet Take 15 mg by mouth daily.    . colchicine 0.6 MG tablet One by mouth three times a day for five days for gout pain. (Patient not taking: Reported on 09/22/2019) 15 tablet 5  . diclofenac Sodium (VOLTAREN) 1 % GEL Apply 2 g topically 4 (four) times daily. (Patient not taking: Reported on 09/22/2019) 50 g 0   No current facility-administered medications for this visit.     Physical Exam  There were no vitals taken for this visit.  Constitutional: overall normal hygiene, normal nutrition, well developed, normal grooming, normal body habitus. Assistive device:none  Musculoskeletal: gait and station Limp none, muscle tone and strength are normal, no tremors or atrophy is present.  .  Neurological: coordination overall normal.  Deep tendon reflex/nerve stretch intact.  Sensation normal.  Cranial nerves II-XII intact.   Skin:   Normal overall no scars, lesions, ulcers or rashes. No psoriasis.  Psychiatric: Alert and oriented x 3.  Recent memory intact, remote memory unclear.  Normal mood and affect. Well groomed.  Good eye contact.  Cardiovascular: overall no swelling, no varicosities, no edema bilaterally, normal temperatures of the legs and arms, no clubbing, cyanosis and good capillary refill.  Lymphatic: palpation is normal.  Right great toe MTP is slightly tender but not swollen or red today.  Gait is good.  NV intact. ROM is good.  All other systems reviewed and are negative   The patient has been educated about the nature of the problem(s) and counseled on treatment options.  The patient appeared to understand what I have discussed and is in agreement with it.  Encounter Diagnoses  Name Primary?  . Acute idiopathic gout of right foot Yes  . Nicotine dependence, cigarettes, uncomplicated     PLAN Call if any problems.  Precautions discussed.  Continue current medications.   Return to clinic as needed   Call if needs  refills.  Electronically Signed Darreld Mclean, MD 6/29/20218:25 AM

## 2019-09-22 NOTE — Patient Instructions (Signed)
Steps to Quit Smoking Smoking tobacco is the leading cause of preventable death. It can affect almost every organ in the body. Smoking puts you and people around you at risk for many serious, long-lasting (chronic) diseases. Quitting smoking can be hard, but it is one of the best things that you can do for your health. It is never too late to quit. How do I get ready to quit? When you decide to quit smoking, make a plan to help you succeed. Before you quit:  Pick a date to quit. Set a date within the next 2 weeks to give you time to prepare.  Write down the reasons why you are quitting. Keep this list in places where you will see it often.  Tell your family, friends, and co-workers that you are quitting. Their support is important.  Talk with your doctor about the choices that may help you quit.  Find out if your health insurance will pay for these treatments.  Know the people, places, things, and activities that make you want to smoke (triggers). Avoid them. What first steps can I take to quit smoking?  Throw away all cigarettes at home, at work, and in your car.  Throw away the things that you use when you smoke, such as ashtrays and lighters.  Clean your car. Make sure to empty the ashtray.  Clean your home, including curtains and carpets. What can I do to help me quit smoking? Talk with your doctor about taking medicines and seeing a counselor at the same time. You are more likely to succeed when you do both.  If you are pregnant or breastfeeding, talk with your doctor about counseling or other ways to quit smoking. Do not take medicine to help you quit smoking unless your doctor tells you to do so. To quit smoking: Quit right away  Quit smoking totally, instead of slowly cutting back on how much you smoke over a period of time.  Go to counseling. You are more likely to quit if you go to counseling sessions regularly. Take medicine You may take medicines to help you quit. Some  medicines need a prescription, and some you can buy over-the-counter. Some medicines may contain a drug called nicotine to replace the nicotine in cigarettes. Medicines may:  Help you to stop having the desire to smoke (cravings).  Help to stop the problems that come when you stop smoking (withdrawal symptoms). Your doctor may ask you to use:  Nicotine patches, gum, or lozenges.  Nicotine inhalers or sprays.  Non-nicotine medicine that is taken by mouth. Find resources Find resources and other ways to help you quit smoking and remain smoke-free after you quit. These resources are most helpful when you use them often. They include:  Online chats with a counselor.  Phone quitlines.  Printed self-help materials.  Support groups or group counseling.  Text messaging programs.  Mobile phone apps. Use apps on your mobile phone or tablet that can help you stick to your quit plan. There are many free apps for mobile phones and tablets as well as websites. Examples include Quit Guide from the CDC and smokefree.gov  What things can I do to make it easier to quit?   Talk to your family and friends. Ask them to support and encourage you.  Call a phone quitline (1-800-QUIT-NOW), reach out to support groups, or work with a counselor.  Ask people who smoke to not smoke around you.  Avoid places that make you want to smoke,   such as: ? Bars. ? Parties. ? Smoke-break areas at work.  Spend time with people who do not smoke.  Lower the stress in your life. Stress can make you want to smoke. Try these things to help your stress: ? Getting regular exercise. ? Doing deep-breathing exercises. ? Doing yoga. ? Meditating. ? Doing a body scan. To do this, close your eyes, focus on one area of your body at a time from head to toe. Notice which parts of your body are tense. Try to relax the muscles in those areas. How will I feel when I quit smoking? Day 1 to 3 weeks Within the first 24 hours,  you may start to have some problems that come from quitting tobacco. These problems are very bad 2-3 days after you quit, but they do not often last for more than 2-3 weeks. You may get these symptoms:  Mood swings.  Feeling restless, nervous, angry, or annoyed.  Trouble concentrating.  Dizziness.  Strong desire for high-sugar foods and nicotine.  Weight gain.  Trouble pooping (constipation).  Feeling like you may vomit (nausea).  Coughing or a sore throat.  Changes in how the medicines that you take for other issues work in your body.  Depression.  Trouble sleeping (insomnia). Week 3 and afterward After the first 2-3 weeks of quitting, you may start to notice more positive results, such as:  Better sense of smell and taste.  Less coughing and sore throat.  Slower heart rate.  Lower blood pressure.  Clearer skin.  Better breathing.  Fewer sick days. Quitting smoking can be hard. Do not give up if you fail the first time. Some people need to try a few times before they succeed. Do your best to stick to your quit plan, and talk with your doctor if you have any questions or concerns. Summary  Smoking tobacco is the leading cause of preventable death. Quitting smoking can be hard, but it is one of the best things that you can do for your health.  When you decide to quit smoking, make a plan to help you succeed.  Quit smoking right away, not slowly over a period of time.  When you start quitting, seek help from your doctor, family, or friends. This information is not intended to replace advice given to you by your health care provider. Make sure you discuss any questions you have with your health care provider. Document Revised: 12/05/2018 Document Reviewed: 05/31/2018 Elsevier Patient Education  2020 Else[employer] Inc[employer]  

## 2020-01-15 ENCOUNTER — Ambulatory Visit: Payer: Medicare HMO | Admitting: Physician Assistant

## 2020-01-18 ENCOUNTER — Ambulatory Visit: Payer: Medicare HMO | Admitting: Cardiology

## 2020-01-18 NOTE — Progress Notes (Signed)
Virtual Visit via Telephone Note   This visit type was conducted due to national recommendations for restrictions regarding the COVID-19 Pandemic (e.g. social distancing) in an effort to limit this patient's exposure and mitigate transmission in our community.  Due to her co-morbid illnesses, this patient is at least at moderate risk for complications without adequate follow up.  This format is felt to be most appropriate for this patient at this time.  The patient did not have access to video technology/had technical difficulties with video requiring transitioning to audio format only (telephone).  All issues noted in this document were discussed and addressed.  No physical exam could be performed with this format.  Please refer to the patient's chart for her  consent to telehealth for [facility].    Date:  01/20/2020   ID:  Jessica Hoover, DOB 05/23/1945, MRN [id number] The patient was identified using 2 identifiers.  Patient Location: Home Provider Location: Office/Clinic  PCP:  [name], MD  Cardiologist:  Dina Rich, MD   Electrophysiologist:  None   Evaluation Performed:  Follow-Up Visit  Chief Complaint:   F/U  History of Present Illness:    Jessica Hoover is a 74 y.o. 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  Patient last had telemedicine visit with Dr. Wyline Mood 07/15/2019 and was doing well.  No changes made.  Patient says she's doing great. Went back to work at [employer] She got bored at home and feels so much better. No regular exercise outside of work. Takes care of her teenage grandson as well. Smokes 3 cigarettes a week.  The patient does not have symptoms concerning for COVID-19 infection (fever, chills, cough, or new shortness of breath).    Past Medical History:  Diagnosis Date  . Arthritis   . Chronic back pain   . Chronic pelvic pain in female    pelvic joint  and thigh  . Chronic right hip pain   . Chronic shoulder pain   . Frequency of urination   . Heart murmur   . Hypertension   . Lumbago   . Neuralgia and neuritis   . Osteoarthritis   . Pneumonia    hx of  . PONV (postoperative nausea and vomiting)   . Radiculitis   . Sciatica   . Seizures (HCC)    had seizures as a child   Past Surgical History:  Procedure Laterality Date  . ABDOMINAL HYSTERECTOMY     partial  . BACK SURGERY    . CHOLECYSTECTOMY    . COLONOSCOPY W/ POLYPECTOMY    . JOINT REPLACEMENT    . TOTAL HIP ARTHROPLASTY Right 10/26/2014   Procedure: TOTAL HIP ARTHROPLASTY;  Surgeon: Valeria Batman, MD;  Location: [facility] OR;  Service: Orthopedics;  Laterality: Right;  . TOTAL HIP ARTHROPLASTY Left 10/09/2016  . TOTAL HIP ARTHROPLASTY Left 10/09/2016   Procedure: LEFT TOTAL HIP ARTHROPLASTY;  Surgeon: Valeria Batman, MD;  Location: [facility] OR;  Service: Orthopedics;  Laterality: Left;     Current Meds  Medication Sig  . amLODipine (NORVASC) 10 MG tablet Take 10 mg by mouth daily.  Marland Kitchen aspirin EC 81 MG tablet Take 1 tablet (81 mg total) by mouth daily.  Marland Kitchen atorvastatin (LIPITOR) 40 MG tablet Take 40 mg by mouth daily.  . colchicine 0.6 MG tablet One by mouth three times a day for five days for gout pain.  Marland Kitchen  diclofenac Sodium (VOLTAREN) 1 % GEL Apply 2 g topically 4 (four) times daily.  . febuxostat (ULORIC) 40 MG tablet One daily for gout  . pioglitazone (ACTOS) 15 MG tablet Take 15 mg by mouth daily.     Allergies:   Clarithromycin, Codeine, Hydrocodone, Penicillins, Sulfa antibiotics, Dilaudid [hydromorphone hcl], and Oxycodone hcl   Social History   Tobacco Use  . Smoking status: Current Some Day Smoker    Packs/day: 0.25    Years: 56.00    Pack years: 14.00    Types: Cigarettes    Start date: 07/11/1965  . Smokeless tobacco: Never Used  . Tobacco comment: 3 per week  Vaping Use  . Vaping Use: Never used  Substance Use Topics  . Alcohol use: No     Alcohol/week: 0.0 standard drinks  . Drug use: No     Family Hx: The patient's family history includes Cancer in her father; Depression in her mother; Hypertension in her father and mother.  ROS:   Please see the history of present illness.      All other systems reviewed and are negative.   Prior CV studies:   The following studies were reviewed today:  05/2015 echo Study Conclusions   - Left ventricle: The cavity size was normal. Wall thickness was   increased in a pattern of moderate LVH. Systolic function was   normal. The estimated ejection fraction was in the range of 60%   to 65%. Wall motion was normal; there were no regional wall   motion abnormalities. Doppler parameters are consistent with   abnormal left ventricular relaxation (grade 1 diastolic   dysfunction). - Aortic valve: Mildly thickened, moderately calcified leaflets.   Aortic valve sclerosis noted. Morphologically, there appears to   be at least mild stenosis. No hemodynamic parameters obtained. - Mitral valve: Mildly calcified annulus.     01/2019 echo IMPRESSIONS     1. Left ventricular ejection fraction, by visual estimation, is 60 to  65%. The left ventricle has normal function. There is mildly increased  left ventricular hypertrophy.   2. Left ventricular diastolic parameters are consistent with Grade I  diastolic dysfunction (impaired relaxation).   3. Global right ventricle has normal systolic function.The right  ventricular size is normal. No increase in right ventricular wall  thickness.   4. Left atrial size was normal.   5. Right atrial size was normal.   6. Mild aortic valve annular calcification.   7. Mild mitral annular calcification.   8. The mitral valve is grossly normal. Trace mitral valve regurgitation.   9. The tricuspid valve is grossly normal. Tricuspid valve regurgitation  is trivial.  10. The aortic valve is tricuspid. Aortic valve regurgitation is not  visualized. Mild to  moderate aortic valve sclerosis/calcification without  any evidence of aortic stenosis. Limited excursion of noncoronary cusp  with upper normal transvalvular  gradients.  11. The pulmonic valve was grossly normal. Pulmonic valve regurgitation is  not visualized.  12. The inferior vena cava is normal in size with greater than 50%  respiratory variability, suggesting right atrial pressure of 3 mmHg.      01/2019 carotid US Right:   Color duplex indicates moderate heterogeneous and calcified plaque, with no hemodynamically significant stenosis by duplex criteria in the extracranial cerebrovascular circulation.   Left:   Heterogeneous and calcified plaque at the left carotid bifurcation with less than 50% stenosis by established duplex criteria. Note that the flow velocities of the left ICA were obtained  from an area distal to the maximum narrowing due to the presence of anterior wall plaque with shadowing and may be underestimating the percentage of ICA stenosis. If establishing a more accurate degree of stenosis is required, cerebral angiogram should be considered, or as a second best test, CTA.       Labs/Other Tests and Data Reviewed:    EKG:  No ECG reviewed.  Recent Labs: No results found for requested labs within last 8760 hours.   Recent Lipid Panel No results found for: CHOL, TRIG, HDL, CHOLHDL, LDLCALC, LDLDIRECT  Wt Readings from Last 3 Encounters:  01/20/20 180 lb (81.6 kg)  09/15/19 175 lb (79.4 kg)  09/12/19 174 lb (78.9 kg)     Risk Assessment/Calculations:      Objective:    Vital Signs:  BP (!) 142/73   Pulse 75   Ht 5\' 4"  (1.626 m)   Wt 180 lb (81.6 kg)   BMI 30.90 kg/m    VITAL SIGNS:  reviewed  ASSESSMENT & PLAN:    Aortic valve sclerosis on echo 01/2019-no symptoms.   Essential hypertension BP minimally elevated but just took her meds. Has been normal  Hyperlipidemia-going to PCP next week for blood work.I've asked her to send to  02/2019.  Carotid stenosis mild bilaterally 01/2019 but images were obtained from an area distal to the maximum narrowing due to the presence of anterior wall plaque with shadowing and may be underestimated.  Cerebral angiogram or second both CTA may be recommended in future studies  Obesity- has lost 5 lbs since she started working  Tobacco abuse-smoking 3 cigarettes/week. Working on 02/2019.    COVID-19 Education: The signs and symptoms of COVID-19 were discussed with the patient and how to seek care for testing (follow up with PCP or arrange E-visit).   The importance of social distancing was discussed today.  Time:   Today, I have spent 8:35 minutes with the patient with telehealth technology discussing the above problems.     Medication Adjustments/Labs and Tests Ordered: Current medicines are reviewed at length with the patient today.  Concerns regarding medicines are outlined above.   Tests Ordered: No orders of the defined types were placed in this encounter.   Medication Changes: No orders of the defined types were placed in this encounter.   Follow Up:  In Person in 1 year(s) Dr. Dole Food  Signed, Wyline Mood, PA-C  01/20/2020 8:29 AM    [facility]

## 2020-01-20 ENCOUNTER — Other Ambulatory Visit: Payer: Self-pay

## 2020-01-20 ENCOUNTER — Telehealth (INDEPENDENT_AMBULATORY_CARE_PROVIDER_SITE_OTHER): Payer: Medicare HMO | Admitting: Physician Assistant

## 2020-01-20 ENCOUNTER — Encounter: Payer: Self-pay | Admitting: Physician Assistant

## 2020-01-20 VITALS — BP 142/73 | HR 75 | Ht 64.0 in | Wt 180.0 lb

## 2020-01-20 DIAGNOSIS — E785 Hyperlipidemia, unspecified: Secondary | ICD-10-CM | POA: Diagnosis not present

## 2020-01-20 DIAGNOSIS — I358 Other nonrheumatic aortic valve disorders: Secondary | ICD-10-CM

## 2020-01-20 DIAGNOSIS — I10 Essential (primary) hypertension: Secondary | ICD-10-CM

## 2020-01-20 DIAGNOSIS — Z720 Tobacco use: Secondary | ICD-10-CM

## 2020-01-20 NOTE — Patient Instructions (Signed)
Medication Instructions:  Your physician recommends that you continue on your current medications as directed. Please refer to the Current Medication list given to you today.  *If you need a refill on your cardiac medications before your next appointment, please call your pharmacy*   Lab Work: None If you have labs (blood work) drawn today and your tests are completely normal, you will receive your results only by:  MyChart Message (if you have MyChart) OR  A paper copy in the mail If you have any lab test that is abnormal or we need to change your treatment, we will call you to review the results.   Follow-Up: At [facility], you and your health needs are our priority.  As part of our continuing mission to provide you with exceptional heart care, we have created designated Provider Care Teams.  These Care Teams include your primary Cardiologist (physician) and Advanced Practice Providers (APPs -  Physician Assistants and Nurse Practitioners) who all work together to provide you with the care you need, when you need it.  We recommend signing up for the patient portal called "MyChart".  Sign up information is provided on this After Visit Summary.  MyChart is used to connect with patients for Virtual Visits (Telemedicine).  Patients are able to view lab/test results, encounter notes, upcoming appointments, etc.  Non-urgent messages can be sent to your provider as well.   To learn more about what you can do with MyChart, go to [email or web address].    Your next appointment:   1 year(s)  The format for your next appointment:   In Person  Provider:   You may see Dina Rich, MD or one of the following Advanced Practice Providers on your designated Care Team:    Randall An, PA-C   Jacolyn Reedy, [place]     Other Instructions Your provider recommends that you maintain 150 minutes per week of moderate aerobic activity.   Coping with Quitting Smoking  Quitting smoking  is a physical and mental challenge. You will face cravings, withdrawal symptoms, and temptation. Before quitting, work with your health care provider to make a plan that can help you cope. Preparation can help you quit and keep you from giving in. How can I cope with cravings? Cravings usually last for 5-10 minutes. If you get through it, the craving will pass. Consider taking the following actions to help you cope with cravings:  Keep your mouth busy: ? Chew sugar-free gum. ? Suck on hard candies or a straw. ? Brush your teeth.  Keep your hands and body busy: ? Immediately change to a different activity when you feel a craving. ? Squeeze or play with a ball. ? Do an activity or a hobby, like making bead jewelry, practicing needlepoint, or working with wood. ? Mix up your normal routine. ? Take a short exercise break. Go for a quick walk or run up and down stairs. ? Spend time in public places where smoking is not allowed.  Focus on doing something kind or helpful for someone else.  Call a friend or family member to talk during a craving.  Join a support group.  Call a quit line, such as 1-800-QUIT-NOW.  Talk with your health care provider about medicines that might help you cope with cravings and make quitting easier for you. How can I deal with withdrawal symptoms? Your body may experience negative effects as it tries to get used to not having nicotine in the system. These effects are called withdrawal  symptoms. They may include:  Feeling hungrier than normal.  Trouble concentrating.  Irritability.  Trouble sleeping.  Feeling depressed.  Restlessness and agitation.  Craving a cigarette. To manage withdrawal symptoms:  Avoid places, people, and activities that trigger your cravings.  Remember why you want to quit.  Get plenty of sleep.  Avoid coffee and other caffeinated drinks. These may worsen some of your symptoms. How can I handle social situations? Social  situations can be difficult when you are quitting smoking, especially in the first few weeks. To manage this, you can:  Avoid parties, bars, and other social situations where people might be smoking.  Avoid alcohol.  Leave right away if you have the urge to smoke.  Explain to your family and friends that you are quitting smoking. Ask for understanding and support.  Plan activities with friends or family where smoking is not an option. What are some ways I can cope with stress? Wanting to smoke may cause stress, and stress can make you want to smoke. Find ways to manage your stress. Relaxation techniques can help. For example:  Breathe slowly and deeply, in through your nose and out through your mouth.  Listen to soothing, relaxing music.  Talk with a family member or friend about your stress.  Light a candle.  Soak in a bath or take a shower.  Think about a peaceful place. What are some ways I can prevent weight gain? Be aware that many people gain weight after they quit smoking. However, not everyone does. To keep from gaining weight, have a plan in place before you quit and stick to the plan after you quit. Your plan should include:  Having healthy snacks. When you have a craving, it may help to: ? Eat plain popcorn, crunchy carrots, celery, or other cut vegetables. ? Chew sugar-free gum.  Changing how you eat: ? Eat small portion sizes at meals. ? Eat 4-6 small meals throughout the day instead of 1-2 large meals a day. ? Be mindful when you eat. Do not watch television or do other things that might distract you as you eat.  Exercising regularly: ? Make time to exercise each day. If you do not have time for a long workout, do short bouts of exercise for 5-10 minutes several times a day. ? Do some form of strengthening exercise, like weight lifting, and some form of aerobic exercise, like running or swimming.  Drinking plenty of water or other low-calorie or no-calorie  drinks. Drink 6-8 glasses of water daily, or as much as instructed by your health care provider. Summary  Quitting smoking is a physical and mental challenge. You will face cravings, withdrawal symptoms, and temptation to smoke again. Preparation can help you as you go through these challenges.  You can cope with cravings by keeping your mouth busy (such as by chewing gum), keeping your body and hands busy, and making calls to family, friends, or a helpline for people who want to quit smoking.  You can cope with withdrawal symptoms by avoiding places where people smoke, avoiding drinks with caffeine, and getting plenty of rest.  Ask your health care provider about the different ways to prevent weight gain, avoid stress, and handle social situations. This information is not intended to replace advice given to you by your health care provider. Make sure you discuss any questions you have with your health care provider. Document Revised: 02/22/2017 Document Reviewed: 03/09/2016 Elsevier Patient Education  2020 [employer].

## 2021-05-01 ENCOUNTER — Observation Stay (HOSPITAL_COMMUNITY)
Admission: EM | Admit: 2021-05-01 | Discharge: 2021-05-03 | Disposition: A | Discharge status: Home / Self Care | Payer: Medicare HMO | Attending: Family Medicine | Admitting: Family Medicine

## 2021-05-01 ENCOUNTER — Emergency Department (HOSPITAL_COMMUNITY): Payer: Medicare HMO

## 2021-05-01 ENCOUNTER — Other Ambulatory Visit: Payer: Self-pay

## 2021-05-01 ENCOUNTER — Encounter (HOSPITAL_COMMUNITY): Payer: Self-pay

## 2021-05-01 DIAGNOSIS — F17210 Nicotine dependence, cigarettes, uncomplicated: Secondary | ICD-10-CM | POA: Diagnosis not present

## 2021-05-01 DIAGNOSIS — E872 Acidosis, unspecified: Secondary | ICD-10-CM | POA: Insufficient documentation

## 2021-05-01 DIAGNOSIS — Z7984 Long term (current) use of oral hypoglycemic drugs: Secondary | ICD-10-CM | POA: Insufficient documentation

## 2021-05-01 DIAGNOSIS — I10 Essential (primary) hypertension: Secondary | ICD-10-CM | POA: Diagnosis present

## 2021-05-01 DIAGNOSIS — E876 Hypokalemia: Secondary | ICD-10-CM | POA: Insufficient documentation

## 2021-05-01 DIAGNOSIS — Z79899 Other long term (current) drug therapy: Secondary | ICD-10-CM | POA: Diagnosis not present

## 2021-05-01 DIAGNOSIS — R748 Abnormal levels of other serum enzymes: Secondary | ICD-10-CM | POA: Diagnosis not present

## 2021-05-01 DIAGNOSIS — E119 Type 2 diabetes mellitus without complications: Secondary | ICD-10-CM

## 2021-05-01 DIAGNOSIS — K760 Fatty (change of) liver, not elsewhere classified: Secondary | ICD-10-CM | POA: Insufficient documentation

## 2021-05-01 DIAGNOSIS — Z96643 Presence of artificial hip joint, bilateral: Secondary | ICD-10-CM | POA: Insufficient documentation

## 2021-05-01 DIAGNOSIS — R1010 Upper abdominal pain, unspecified: Secondary | ICD-10-CM | POA: Diagnosis present

## 2021-05-01 DIAGNOSIS — R109 Unspecified abdominal pain: Secondary | ICD-10-CM

## 2021-05-01 DIAGNOSIS — R7401 Elevation of levels of liver transaminase levels: Secondary | ICD-10-CM | POA: Diagnosis not present

## 2021-05-01 DIAGNOSIS — Z7982 Long term (current) use of aspirin: Secondary | ICD-10-CM | POA: Insufficient documentation

## 2021-05-01 DIAGNOSIS — Z20822 Contact with and (suspected) exposure to covid-19: Secondary | ICD-10-CM | POA: Insufficient documentation

## 2021-05-01 DIAGNOSIS — E878 Other disorders of electrolyte and fluid balance, not elsewhere classified: Secondary | ICD-10-CM | POA: Diagnosis present

## 2021-05-01 LAB — LACTIC ACID, PLASMA
Lactic Acid, Venous: 2.5 mmol/L (ref 0.5–1.9)
Lactic Acid, Venous: 1.9 mmol/L (ref 0.5–1.9)

## 2021-05-01 LAB — CBC WITH DIFFERENTIAL/PLATELET
Abs Immature Granulocytes: 0.03 10*3/uL (ref 0.00–0.07)
Basophils Absolute: 0.1 10*3/uL (ref 0.0–0.1)
Basophils Relative: 1 %
Eosinophils Absolute: 0.1 10*3/uL (ref 0.0–0.5)
Eosinophils Relative: 1 %
HCT: 43.1 % (ref 36.0–46.0)
Hemoglobin: 13.9 g/dL (ref 12.0–15.0)
Immature Granulocytes: 0 %
Lymphocytes Relative: 31 %
Lymphs Abs: 2.6 10*3/uL (ref 0.7–4.0)
MCH: 29.1 pg (ref 26.0–34.0)
MCHC: 32.3 g/dL (ref 30.0–36.0)
MCV: 90.2 fL (ref 80.0–100.0)
Monocytes Absolute: 0.6 10*3/uL (ref 0.1–1.0)
Monocytes Relative: 7 %
Neutro Abs: 5.1 10*3/uL (ref 1.7–7.7)
Neutrophils Relative %: 60 %
Platelets: 217 10*3/uL (ref 150–400)
RBC: 4.78 MIL/uL (ref 3.87–5.11)
RDW: 13.5 % (ref 11.5–15.5)
WBC: 8.4 10*3/uL (ref 4.0–10.5)
nRBC: 0 % (ref 0.0–0.2)

## 2021-05-01 LAB — COMPREHENSIVE METABOLIC PANEL
ALT: 205 U/L — ABNORMAL HIGH (ref 0–44)
AST: 166 U/L — ABNORMAL HIGH (ref 15–41)
Albumin: 3.8 g/dL (ref 3.5–5.0)
Alkaline Phosphatase: 200 U/L — ABNORMAL HIGH (ref 38–126)
Anion gap: 10 (ref 5–15)
BUN: 13 mg/dL (ref 8–23)
CO2: 25 mmol/L (ref 22–32)
Calcium: 9.2 mg/dL (ref 8.9–10.3)
Chloride: 101 mmol/L (ref 98–111)
Creatinine, Ser: 0.91 mg/dL (ref 0.44–1.00)
GFR, Estimated: >60 mL/min (ref >60)
Glucose, Bld: 212 mg/dL — ABNORMAL HIGH (ref 70–99)
Potassium: 2.8 mmol/L — ABNORMAL LOW (ref 3.5–5.1)
Sodium: 136 mmol/L (ref 135–145)
Total Bilirubin: 1.3 mg/dL — ABNORMAL HIGH (ref 0.3–1.2)
Total Protein: 8.2 g/dL — ABNORMAL HIGH (ref 6.5–8.1)

## 2021-05-01 LAB — URINALYSIS, ROUTINE W REFLEX MICROSCOPIC
Bilirubin Urine: NEGATIVE
Glucose, UA: 50 mg/dL — AB
Hgb urine dipstick: NEGATIVE
Ketones, ur: NEGATIVE mg/dL
Leukocytes,Ua: NEGATIVE
Nitrite: NEGATIVE
Protein, ur: NEGATIVE mg/dL
Specific Gravity, Urine: 1.027 (ref 1.005–1.030)
pH: 7 (ref 5.0–8.0)

## 2021-05-01 LAB — LIPASE, BLOOD: Lipase: 47 U/L (ref 11–51)

## 2021-05-01 LAB — RESP PANEL BY RT-PCR (FLU A&B, COVID) ARPGX2
Influenza A by PCR: NEGATIVE
Influenza B by PCR: NEGATIVE
SARS Coronavirus 2 by RT PCR: NEGATIVE

## 2021-05-01 LAB — ACETAMINOPHEN LEVEL: Acetaminophen (Tylenol), Serum: <10 ug/mL — ABNORMAL LOW (ref 10–30)

## 2021-05-01 LAB — MAGNESIUM: Magnesium: 1.6 mg/dL — ABNORMAL LOW (ref 1.7–2.4)

## 2021-05-01 LAB — SALICYLATE LEVEL: Salicylate Lvl: <7 mg/dL — ABNORMAL LOW (ref 7.0–30.0)

## 2021-05-01 LAB — CBG MONITORING, ED: Glucose-Capillary: 171 mg/dL — ABNORMAL HIGH (ref 70–99)

## 2021-05-01 MED ORDER — POTASSIUM CHLORIDE IN NACL 40-0.9 MEQ/L-% IV SOLN
INTRAVENOUS | Status: AC
  Filled 2021-05-01 (×4): qty 1000

## 2021-05-01 MED ORDER — ONDANSETRON HCL 4 MG/2ML IJ SOLN
4.0000 mg | Freq: Once | INTRAMUSCULAR | Status: AC
  Administered 2021-05-01: 4 mg via INTRAVENOUS
  Filled 2021-05-01: qty 2

## 2021-05-01 MED ORDER — POTASSIUM CHLORIDE 10 MEQ/100ML IV SOLN
10.0000 meq | INTRAVENOUS | Status: AC
  Administered 2021-05-01 (×2): 10 meq via INTRAVENOUS
  Filled 2021-05-01 (×2): qty 100

## 2021-05-01 MED ORDER — IOHEXOL 300 MG/ML  SOLN
100.0000 mL | Freq: Once | INTRAMUSCULAR | Status: AC | PRN
  Administered 2021-05-01: 100 mL via INTRAVENOUS

## 2021-05-01 MED ORDER — ONDANSETRON HCL 4 MG PO TABS: 4.0000 mg | ORAL_TABLET | Freq: Four times a day (QID) | ORAL | Status: DC | PRN

## 2021-05-01 MED ORDER — POTASSIUM CHLORIDE CRYS ER 20 MEQ PO TBCR
40.0000 meq | EXTENDED_RELEASE_TABLET | Freq: Once | ORAL | Status: AC
  Administered 2021-05-01: 40 meq via ORAL
  Filled 2021-05-01: qty 2

## 2021-05-01 MED ORDER — SODIUM CHLORIDE 0.9 % IV BOLUS
1000.0000 mL | Freq: Once | INTRAVENOUS | Status: AC
  Administered 2021-05-01: 1000 mL via INTRAVENOUS

## 2021-05-01 MED ORDER — ENOXAPARIN SODIUM 40 MG/0.4ML IJ SOSY
40.0000 mg | PREFILLED_SYRINGE | INTRAMUSCULAR | Status: DC
  Administered 2021-05-02 – 2021-05-03 (×2): 40 mg via SUBCUTANEOUS
  Filled 2021-05-01 (×2): qty 0.4

## 2021-05-01 MED ORDER — AMLODIPINE BESYLATE 5 MG PO TABS
10.0000 mg | ORAL_TABLET | Freq: Every day | ORAL | Status: DC
  Administered 2021-05-02 – 2021-05-03 (×2): 10 mg via ORAL
  Filled 2021-05-01 (×2): qty 2

## 2021-05-01 MED ORDER — MAGNESIUM SULFATE 2 GM/50ML IV SOLN
2.0000 g | Freq: Once | INTRAVENOUS | Status: AC
  Administered 2021-05-01: 2 g via INTRAVENOUS
  Filled 2021-05-01: qty 50

## 2021-05-01 MED ORDER — INSULIN ASPART 100 UNIT/ML IJ SOLN
0.0000 [IU] | Freq: Four times a day (QID) | INTRAMUSCULAR | Status: DC
  Administered 2021-05-01: 2 [IU] via SUBCUTANEOUS
  Administered 2021-05-02 (×2): 1 [IU] via SUBCUTANEOUS
  Administered 2021-05-02: 2 [IU] via SUBCUTANEOUS
  Administered 2021-05-02 – 2021-05-03 (×2): 1 [IU] via SUBCUTANEOUS
  Administered 2021-05-03: 2 [IU] via SUBCUTANEOUS
  Filled 2021-05-01 (×2): qty 1

## 2021-05-01 MED ORDER — ONDANSETRON HCL 4 MG/2ML IJ SOLN: 4.0000 mg | Freq: Four times a day (QID) | INTRAMUSCULAR | Status: DC | PRN

## 2021-05-01 MED ORDER — POLYETHYLENE GLYCOL 3350 17 G PO PACK: 17.0000 g | PACK | Freq: Every day | ORAL | Status: DC | PRN

## 2021-05-01 NOTE — ED Notes (Signed)
IV infiltrated during zofran push, EDP made aware

## 2021-05-01 NOTE — ED Triage Notes (Signed)
Patient states lower back pain at 2pm yesterday with increasing pain and N/V throughout the night. Patient given 10mg  IV en route via EMS.

## 2021-05-01 NOTE — H&P (Signed)
History and Physical    Jessica PeaOla S Bowie UJW:[id number] DOB: 02-Aug-1945 DOA: 05/01/2021  PCP: Smith RobertKikel, Stephen, MD   Patient coming from: Home  I have personally briefly reviewed patient's old medical [facility]  Chief Complaint: Abdominal pain, vomiting  HPI: Jessica Hoover is a 76 y.o. female with medical history significant for hypertension, diabetes.  Patient presented to the ED with complaints of right sided, upper abdominal pain that started yesterday at about 2 PM.  She reports abdominal pain was so severe she could not move.  She reports associated multiple episodes of vomiting.  No loose stools.  She tells me she rarely ever takes any medication including Tylenol, she took only 1 tablet of tylenol once as pain was too severe.  Denies aspirin use.  Denies alcohol use.  Patient tells me about 2 weeks ago, her primary care provider had discontinued one of her medications which she was has been on for at least 2 years she thinks the medications was for diabetes.  She was told the medication had caused her liver to be abnormal.  She was placed on a second medication, she has not started this new medication and does not know the name of either of these medications.  ED Course: Pressure 98.1.  Heart rate 70-87.  Respirate rate 16-18.  Blood pressure systolic 150s to [id number].  O2 sat greater than 92% on room air.  Potassium 2.8.  Magnesium 6.  Lactic acid 2.5 > 1.9.  UA not suggestive of UTI. CT abdomen and pelvis with contrast without acute abnormality, suggests cirrhosis. RUQ ultrasound-hepatic steatosis. EDP talked with Dr. Marletta Lorarver, recommended admission fluids replete electrolytes to see tomorrow.  Review of Systems: As per HPI all other systems reviewed and negative.  Past Medical History:  Diagnosis Date   Arthritis    Chronic back pain    Chronic pelvic pain in female    pelvic joint and thigh   Chronic right hip pain    Chronic shoulder pain    Frequency of urination     Heart murmur    Hypertension    Lumbago    Neuralgia and neuritis    Osteoarthritis    Pneumonia    hx of   PONV (postoperative nausea and vomiting)    Radiculitis    Sciatica    Seizures (HCC)    had seizures as a child    Past Surgical History:  Procedure Laterality Date   ABDOMINAL HYSTERECTOMY     partial   BACK SURGERY     CHOLECYSTECTOMY     COLONOSCOPY W/ POLYPECTOMY     JOINT REPLACEMENT     TOTAL HIP ARTHROPLASTY Right 10/26/2014   Procedure: TOTAL HIP ARTHROPLASTY;  Surgeon: Valeria BatmanPeter W Whitfield, MD;  Location: [facility] OR;  Service: Orthopedics;  Laterality: Right;   TOTAL HIP ARTHROPLASTY Left 10/09/2016   TOTAL HIP ARTHROPLASTY Left 10/09/2016   Procedure: LEFT TOTAL HIP ARTHROPLASTY;  Surgeon: Valeria BatmanWhitfield, Peter W, MD;  Location: [facility] OR;  Service: Orthopedics;  Laterality: Left;     reports that she has been smoking cigarettes. She started smoking about 55 years ago. She has a 14.00 pack-year smoking history. She has never used smokeless tobacco. She reports that she does not drink alcohol and does not use drugs.  Allergies  Allergen Reactions   Clarithromycin Shortness Of Breath   Codeine Shortness Of Breath   Hydrocodone Shortness Of Breath   Penicillins Shortness Of Breath and Other (See Comments)  PATIENT HAS HAD A PCN REACTION WITH IMMEDIATE RASH, FACIAL/TONGUE/THROAT SWELLING, SOB, OR LIGHTHEADEDNESS WITH HYPOTENSION:  #  #  #  YES  #  #  #   Has patient had a PCN reaction causing severe rash involving mucus membranes or skin necrosis: No Has patient had a PCN reaction that required hospitalization: No Has patient had a PCN reaction occurring within the last 10 years: No    Sulfa Antibiotics Shortness Of Breath   Dilaudid [Hydromorphone Hcl] Other (See Comments)    ORAL SORES   Oxycodone Hcl Nausea Only    Family History  Problem Relation Age of Onset   Hypertension Mother    Depression Mother    Hypertension Father    Cancer Father    Prior to  Admission medications   Medication Sig Start Date End Date Taking? Authorizing Provider  amLODipine (NORVASC) 10 MG tablet Take 10 mg by mouth daily.   Yes [provider]  diclofenac Sodium (VOLTAREN) 1 % GEL Apply 2 g topically 4 (four) times daily. 06/15/19  Yes Long, Arlyss Repress, MD  aspirin EC 81 MG tablet Take 1 tablet (81 mg total) by mouth daily. Patient not taking: Reported on 05/01/2021 01/21/19   Antoine Poche, MD  colchicine 0.6 MG tablet One by mouth three times a day for five days for gout pain. Patient not taking: Reported on 05/01/2021 09/15/19   Darreld Mclean, MD  febuxostat Thyra Breed) 40 MG tablet One daily for gout Patient not taking: Reported on 05/01/2021 09/01/19   Darreld Mclean, MD  sitaGLIPtin [facility]) 100 MG tablet     [provider]    Physical Exam: Vitals:   05/01/21 1346 05/01/21 1552 05/01/21 1900 05/01/21 2000  BP: (!) 153/80 (!) 153/76 (!) 159/87 (!) 160/85  Pulse: 77 70 87 83  Resp: 18 16 17 18   Temp:  97.8 F (36.6 C)    TempSrc:  Oral    SpO2: 92% 94% 96% 98%  Weight:      Height:        Constitutional: NAD, calm, comfortable Vitals:   05/01/21 1346 05/01/21 1552 05/01/21 1900 05/01/21 2000  BP: (!) 153/80 (!) 153/76 (!) 159/87 (!) 160/85  Pulse: 77 70 87 83  Resp: 18 16 17 18   Temp:  97.8 F (36.6 C)    TempSrc:  Oral    SpO2: 92% 94% 96% 98%  Weight:      Height:       Eyes: PERRL, lids and conjunctivae normal ENMT: Mucous membranes are moist. Posterior pharynx clear of any exudate or lesions.Normal dentition.  Neck: normal, supple, no masses, no thyromegaly Respiratory: clear to auscultation bilaterally, no wheezing, no crackles. Normal respiratory effort. No accessory muscle use.  Cardiovascular: Regular rate and rhythm, no murmurs / rubs / gallops. No extremity edema. 2+ pedal pulses. No carotid bruits.  Abdomen: no tenderness, no masses palpated. No hepatosplenomegaly. Bowel sounds positive.  Musculoskeletal: no  clubbing / cyanosis. No joint deformity upper and lower extremities. Good ROM, no contractures. Normal muscle tone.  Skin: no rashes, lesions, ulcers. No induration Neurologic: No apparent cranial nerve abnormality, renal extremities spontaneously. Psychiatric: Normal judgment and insight. Alert and oriented x 3. Normal mood.   Labs on Admission: I have personally reviewed following labs and imaging studies  CBC: Recent Labs  Lab 05/01/21 1508  WBC 8.4  NEUTROABS 5.1  HGB 13.9  HCT 43.1  MCV 90.2  PLT 217   Basic Metabolic Panel: Recent Labs  Lab 05/01/21 1508 05/01/21 1737  NA 136  --   K 2.8*  --   CL 101  --   CO2 25  --   GLUCOSE 212*  --   BUN 13  --   CREATININE 0.91  --   CALCIUM 9.2  --   MG  --  1.6*   GFR: Estimated Creatinine Clearance: 57.5 mL/min (by C-G formula based on SCr of 0.91 mg/dL). Liver Function Tests: Recent Labs  Lab 05/01/21 1508  AST 166*  ALT 205*  ALKPHOS 200*  BILITOT 1.3*  PROT 8.2*  ALBUMIN 3.8   Recent Labs  Lab 05/01/21 1508  LIPASE 47   Urine analysis:    Component Value Date/Time   COLORURINE STRAW (A) 05/01/2021 1854   APPEARANCEUR CLEAR 05/01/2021 1854   LABSPEC 1.027 05/01/2021 1854   PHURINE 7.0 05/01/2021 1854   GLUCOSEU 50 (A) 05/01/2021 1854   HGBUR NEGATIVE 05/01/2021 1854   BILIRUBINUR NEGATIVE 05/01/2021 1854   KETONESUR NEGATIVE 05/01/2021 1854   PROTEINUR NEGATIVE 05/01/2021 1854   UROBILINOGEN 1.0 10/31/2014 2200   NITRITE NEGATIVE 05/01/2021 1854   LEUKOCYTESUR NEGATIVE 05/01/2021 1854    Radiological Exams on Admission: CT ABDOMEN PELVIS W CONTRAST  Result Date: 05/01/2021 CLINICAL DATA:  Nausea and vomiting EXAM: CT ABDOMEN AND PELVIS WITH CONTRAST TECHNIQUE: Multidetector CT imaging of the abdomen and pelvis was performed using the standard protocol following bolus administration of intravenous contrast. RADIATION DOSE REDUCTION: This exam was performed according to the departmental  dose-optimization program which includes automated exposure control, adjustment of the mA and/or kV according to patient size and/or use of iterative reconstruction technique. CONTRAST:  OMNIPAQUE IOHEXOL 300 MG/ML  SOLN COMPARISON:  CT abdomen and pelvis dated October 06, 2018 FINDINGS: Lower chest: Right lower lobe linear opacity which is likely due to atelectasis. Hepatobiliary: Mildly nodular liver contour no focal liver abnormality is seen. Status post cholecystectomy. No biliary dilatation. Pancreas: Unremarkable. No pancreatic ductal dilatation or surrounding inflammatory changes. Spleen: Normal in size without focal abnormality. Adrenals/Urinary Tract: Cyst bilateral adrenal glands are unremarkable. Kidneys enhance symmetrically with no evidence of hydronephrosis renal vascular calcifications with no definite nephrolithiasis. Visualized bladder is unremarkable. Stomach/Bowel: Stomach is within normal limits. Appendix appears normal. Diverticulosis. No evidence of bowel wall thickening, distention, or inflammatory changes. Vascular/Lymphatic: Aortic atherosclerosis. No enlarged abdominal or pelvic lymph nodes. Reproductive: Status post hysterectomy. No adnexal masses. Other: Tiny fat containing umbilical hernia. No abdominopelvic ascites Musculoskeletal: Bilateral hip arthroplasties. No acute intracranial abnormality. IMPRESSION: 1. No acute findings in the abdomen or pelvis. 2. Diverticulosis with no evidence of diverticulitis. 3. Mildly nodular liver contour which is suggestive of cirrhosis. 4.  Aortic Atherosclerosis (ICD10-I70.0). Electronically Signed   By: Allegra Lai M.D.   On: 05/01/2021 16:55   US Abdomen Limited  Result Date: 05/01/2021 CLINICAL DATA:  Right upper quadrant pain. EXAM: ULTRASOUND ABDOMEN LIMITED RIGHT UPPER QUADRANT COMPARISON:  CT abdomen pelvis dated October 06, 2018. FINDINGS: Gallbladder: Surgically absent. Common bile duct: Diameter: 5 mm, normal. Liver: No focal lesion  identified. Heterogeneously increased parenchymal echogenicity. Portal vein is patent on color Doppler imaging with normal direction of blood flow towards the liver. Other: None. IMPRESSION: 1. No acute abnormality. 2. Unchanged hepatic steatosis. Electronically Signed   By: Obie Dredge M.D.   On: 05/01/2021 16:47    EKG:  none  Assessment/Plan Principal Problem:   Elevated liver enzymes Active Problems:   Essential hypertension   Electrolyte abnormality   Diabetes (  HCC)   Elevated liver enzymes-presents in with RUQ abdominal pain, vomiting.  AST 166, ALT 205, ALP 200.  T. bili 1.3.  Normal lipase 4.7.  CT abdomen and pelvis right upper quadrant ultrasound negative for etiology, suggest cirrhosis, hepatic steatosis. - EDP talked to GI, Dr. Marletta Lor to see in the morning. -Reports she had a medication discontinued 2 weeks ago due to elevated liver enzymes and it was thought this medication was the culprit.  She does not know the name of the medication -Request records from patient's primary care provider in the morning- Dr. Bryna Colander at [facility], including recent medication changes and recent liver enzymes. -Acute hepatitis panel -Tylenol level, salicylate level -IV morphine 2 mg as needed - 2 L was given, continue N/s + 40 kcl 100cc/hr x 15hrs -Zofran as needed -Bowel rest with clear liquid diet  Electrolyte abnormalities, lactic acidosis-2.5 > 1.9.  Magnesium 1.6.  Potassium 2.8.  Likely due to vomiting, hydration. -Replete lytes, hydrate -EKG check QTc  Hypertension-  Elevated-resume Norvasc  Diabetes mellitus-glucose 212.  Medication recently discontinued for diabetes due to elevated liver enzymes, it appears she was prescribed sitagliptin which she has not started. - HgbA1c - SSI- S, Q6h  DVT prophylaxis: Lovenox Code Status: Full Family Communication: Daughter Vernona Rieger at bedside Disposition Plan: ~ 1- 2 days Consults called: None Admission status:  Obs , tele     Onnie Boer MD [facility]  05/01/2021, 10:23 PM

## 2021-05-01 NOTE — ED Provider Notes (Signed)
[facility] EMERGENCY DEPARTMENT Provider Note   CSN: [id number] Arrival date & time: 05/01/21  1207     History  Chief Complaint  Patient presents with   Back Pain    Brita BLONDELL LAPERLE is a 76 y.o. female.  HPI  Patient with medical history including hypertension, bilateral hip replacements, chronic back pain presents with complaints of right side pain, pain came on suddenly about 2 days ago, pain is focalized in her right side, does not radiate, is not worsened with movement, does not improve with rest, she has associated nausea and vomiting denies hematemesis or coffee-ground emesis she does note that she is having some hematuria, denies dysuria or urinary frequency, admits to some constipation but denies melena or hematochezia.  She also notes that she has had a cough for about 2 weeks time, with some nasal congestion no fevers chills sore throat general body aches, she had cholecystectomy without other significant abdominal history.  She denies any alleviating or aggravating factors.  Has no other complaints.  Home Medications Prior to Admission medications   Medication Sig Start Date End Date Taking? Authorizing Provider  amLODipine (NORVASC) 10 MG tablet Take 10 mg by mouth daily.   Yes [provider]  diclofenac Sodium (VOLTAREN) 1 % GEL Apply 2 g topically 4 (four) times daily. 06/15/19  Yes Long, Wonda Olds, MD  aspirin EC 81 MG tablet Take 1 tablet (81 mg total) by mouth daily. Patient not taking: Reported on 05/01/2021 01/21/19   Arnoldo Lenis, MD  colchicine 0.6 MG tablet One by mouth three times a day for five days for gout pain. Patient not taking: Reported on 05/01/2021 09/15/19   Sanjuana Kava, MD  febuxostat Melburn Popper) 40 MG tablet One daily for gout Patient not taking: Reported on 05/01/2021 09/01/19   Sanjuana Kava, MD  sitaGLIPtin (JANUVIA) 100 MG tablet     [provider]      Allergies    Clarithromycin, Codeine, Hydrocodone, Penicillins, Sulfa  antibiotics, Dilaudid [hydromorphone hcl], and Oxycodone hcl    Review of Systems   Review of Systems  Constitutional:  Negative for chills and fever.  HENT:  Positive for congestion.   Respiratory:  Positive for cough. Negative for shortness of breath.   Cardiovascular:  Negative for chest pain.  Gastrointestinal:  Positive for constipation, nausea and vomiting. Negative for abdominal pain.  Genitourinary:  Positive for flank pain and hematuria. Negative for dysuria.  Neurological:  Negative for headaches.   Physical Exam Updated Vital Signs BP (!) 160/85    Pulse 83    Temp 97.8 F (36.6 C) (Oral)    Resp 18    Ht '5\' 5"'  (1.651 m)    Wt 85 kg    SpO2 98%    BMI 31.18 kg/m  Physical Exam Vitals and nursing note reviewed.  Constitutional:      General: She is not in acute distress.    Appearance: She is not ill-appearing.  HENT:     Head: Normocephalic and atraumatic.     Nose: No congestion.  Eyes:     Conjunctiva/sclera: Conjunctivae normal.  Cardiovascular:     Rate and Rhythm: Normal rate and regular rhythm.     Pulses: Normal pulses.     Heart sounds: No murmur heard.   No friction rub. No gallop.  Pulmonary:     Effort: No respiratory distress.     Breath sounds: No wheezing, rhonchi or rales.  Abdominal:     Palpations:  Abdomen is soft.     Tenderness: There is no abdominal tenderness. There is no right CVA tenderness or left CVA tenderness.     Comments: Abdomen nondistended normal bowel sounds dull to percussion, has noted right-sided flank tenderness, no CVA tenderness, there is guarding, rebound tenderness, Neer's sign negative Murphy sign or McBurney point.  Musculoskeletal:     Comments: Spine was palpated is nontender to palpation no step-off deformities noted, no overlying skin changes, shows full range of motion 5-5 strength neurovascularly intact in lower extremities negative straight leg raise.  Skin:    General: Skin is warm and dry.  Neurological:      Mental Status: She is alert.  Psychiatric:        Mood and Affect: Mood normal.    ED Results / Procedures / Treatments   Labs (all labs ordered are listed, but only abnormal results are displayed) Labs Reviewed  COMPREHENSIVE METABOLIC PANEL - Abnormal; Notable for the following components:      Result Value   Potassium 2.8 (*)    Glucose, Bld 212 (*)    Total Protein 8.2 (*)    AST 166 (*)    ALT 205 (*)    Alkaline Phosphatase 200 (*)    Total Bilirubin 1.3 (*)    All other components within normal limits  LACTIC ACID, PLASMA - Abnormal; Notable for the following components:   Lactic Acid, Venous 2.5 (*)    All other components within normal limits  URINALYSIS, ROUTINE W REFLEX MICROSCOPIC - Abnormal; Notable for the following components:   Color, Urine STRAW (*)    Glucose, UA 50 (*)    All other components within normal limits  MAGNESIUM - Abnormal; Notable for the following components:   Magnesium 1.6 (*)    All other components within normal limits  RESP PANEL BY RT-PCR (FLU A&B, COVID) ARPGX2  LIPASE, BLOOD  LACTIC ACID, PLASMA  CBC WITH DIFFERENTIAL/PLATELET  HEMOGLOBIN A1C  CBC  COMPREHENSIVE METABOLIC PANEL  HEPATITIS PANEL, ACUTE  ACETAMINOPHEN LEVEL  SALICYLATE LEVEL    EKG None  Radiology CT ABDOMEN PELVIS W CONTRAST  Result Date: 05/01/2021 CLINICAL DATA:  Nausea and vomiting EXAM: CT ABDOMEN AND PELVIS WITH CONTRAST TECHNIQUE: Multidetector CT imaging of the abdomen and pelvis was performed using the standard protocol following bolus administration of intravenous contrast. RADIATION DOSE REDUCTION: This exam was performed according to the departmental dose-optimization program which includes automated exposure control, adjustment of the mA and/or kV according to patient size and/or use of iterative reconstruction technique. CONTRAST:  164m OMNIPAQUE IOHEXOL 300 MG/ML  SOLN COMPARISON:  CT abdomen and pelvis dated October 06, 2018 FINDINGS: Lower chest:  Right lower lobe linear opacity which is likely due to atelectasis. Hepatobiliary: Mildly nodular liver contour no focal liver abnormality is seen. Status post cholecystectomy. No biliary dilatation. Pancreas: Unremarkable. No pancreatic ductal dilatation or surrounding inflammatory changes. Spleen: Normal in size without focal abnormality. Adrenals/Urinary Tract: Cyst bilateral adrenal glands are unremarkable. Kidneys enhance symmetrically with no evidence of hydronephrosis renal vascular calcifications with no definite nephrolithiasis. Visualized bladder is unremarkable. Stomach/Bowel: Stomach is within normal limits. Appendix appears normal. Diverticulosis. No evidence of bowel wall thickening, distention, or inflammatory changes. Vascular/Lymphatic: Aortic atherosclerosis. No enlarged abdominal or pelvic lymph nodes. Reproductive: Status post hysterectomy. No adnexal masses. Other: Tiny fat containing umbilical hernia. No abdominopelvic ascites Musculoskeletal: Bilateral hip arthroplasties. No acute intracranial abnormality. IMPRESSION: 1. No acute findings in the abdomen or pelvis. 2. Diverticulosis with no  evidence of diverticulitis. 3. Mildly nodular liver contour which is suggestive of cirrhosis. 4.  Aortic Atherosclerosis (ICD10-I70.0). Electronically Signed   By: Yetta Glassman M.D.   On: 05/01/2021 16:55   US Abdomen Limited  Result Date: 05/01/2021 CLINICAL DATA:  Right upper quadrant pain. EXAM: ULTRASOUND ABDOMEN LIMITED RIGHT UPPER QUADRANT COMPARISON:  CT abdomen pelvis dated October 06, 2018. FINDINGS: Gallbladder: Surgically absent. Common bile duct: Diameter: 5 mm, normal. Liver: No focal lesion identified. Heterogeneously increased parenchymal echogenicity. Portal vein is patent on color Doppler imaging with normal direction of blood flow towards the liver. Other: None. IMPRESSION: 1. No acute abnormality. 2. Unchanged hepatic steatosis. Electronically Signed   By: Titus Dubin M.D.   On:  05/01/2021 16:47    Procedures Procedures    Medications Ordered in ED Medications  amLODipine (NORVASC) tablet 10 mg (has no administration in time range)  enoxaparin (LOVENOX) injection 40 mg (has no administration in time range)  insulin aspart (novoLOG) injection 0-9 Units (has no administration in time range)  0.9 % NaCl with KCl 40 mEq / L  infusion (has no administration in time range)  ondansetron (ZOFRAN) tablet 4 mg (has no administration in time range)    Or  ondansetron (ZOFRAN) injection 4 mg (has no administration in time range)  polyethylene glycol (MIRALAX / GLYCOLAX) packet 17 g (has no administration in time range)  sodium chloride 0.9 % bolus 1,000 mL (0 mLs Intravenous Stopped 05/01/21 1452)  ondansetron (ZOFRAN) injection 4 mg (4 mg Intravenous Given 05/01/21 1352)  iohexol (OMNIPAQUE) 300 MG/ML solution 100 mL (100 mLs Intravenous Contrast Given 05/01/21 1637)  potassium chloride 10 mEq in 100 mL IVPB (0 mEq Intravenous Stopped 05/01/21 2023)  potassium chloride SA (KLOR-CON M) CR tablet 40 mEq (40 mEq Oral Given 05/01/21 1826)  sodium chloride 0.9 % bolus 1,000 mL (1,000 mLs Intravenous Bolus 05/01/21 1823)  magnesium sulfate IVPB 2 g 50 mL (0 g Intravenous Stopped 05/01/21 2127)    ED Course/ Medical Decision Making/ A&P                           Medical Decision Making Amount and/or Complexity of Data Reviewed Labs: ordered. Radiology: ordered.  Risk Prescription drug management. Decision regarding hospitalization.   This patient presents to the ED for concern of side pain, this involves an extensive number of treatment options, and is a complaint that carries with it a high risk of complications and morbidity.  The differential diagnosis includes kidney stone, Pilo, choledocholithiasis, bowel obstruction, dissection    Additional history obtained:  Additional history obtained from daughter who is at bedside    Co morbidities that complicate the patient  evaluation  N/A  Social Determinants of Health:  N/A    Lab Tests:  I Ordered, and personally interpreted labs.  The pertinent results include: CBC unremarkable CMP shows potassium 2.8 glucose 212 AST 166 ALT 205 alk phos 200 T. bili 1.3 lactic 2.5 lipase 47, magnesium 1.6 respiratory panel unremarkable, unremarkable   Imaging Studies ordered:  I ordered imaging studies including limited ultrasound, CT abdomen pelvis I independently visualized and interpreted imaging which showed liver ultrasound negative for acute findings, CT abdomen pelvis reveals liver nodules I agree with the radiologist interpretation    Medicines ordered and prescription drug management:  I ordered medication including fluids, antiemetics for nausea I have reviewed the patients home medicines and have made adjustments as needed  Critical Interventions:  IV Potassium as well as magnesium   Reevaluation:  On initial evaluation patient was vomiting on my exam, started on fluids and antiemetics, patient is reassessed she is resting comfortably, has no complaints we will continue to monitor.  It was noted the patient elevated liver enzymes with tenderness on her right flank I am concerned for possible biliary abnormality, will obtain lipid ultrasound CT abdomen pelvis and reassess  Imaging unremarkable does not explain elevated liver enzymes we will consult with GI for further recommendations  Discussed recommendations with patient and she is agreement with admission.  Will consult with hospitalist team.  Consultations Obtained:  I requested consultation with the spoke with Dr. Abbey Chatters of GI,  and discussed lab and imaging findings as well as pertinent plan - they recommend: He does not feel MRCP is necessary at this time, recommends hospital admission and observation will see the patient in the morning. Spoke with Dr. Freddie Breech who will admit the patient.   Rule out Low suspicion for systemic  infection patient is nontoxic-appearing vital signs reassuring does not meet sepsis or SIRS criteria.  It is noted that she has an elevated lactic but I suspect this is likely secondary due to dehydration from nausea and vomiting.  I have low suspicion for cholangitis, choledocholithiasis as she has no ductal dilation she is afebrile nontachycardic no leukocytosis.  I have low suspicion for intra-abdominal infection, volvulus, obstruction, intra-abdominal mass CT imaging negative for these findings.  I have low suspicion for UTI Pilo or kidney stones UA is negative for signs of infection or hematuria.    Dispostion and problem list  After consideration of the diagnostic results and the patients response to treatment, I feel that the patent would benefit from admission.  Nausea vomiting-stable but unclear etiology patient need further evaluation. Elevated liver enzymes-possibly transient will need further evaluation by GI Electrolyte derailment-likely secondary due to nausea and vomiting currently getting IV potassium magnesium will need continued monitoring.            Final Clinical Impression(s) / ED Diagnoses Final diagnoses:  Hypokalemia  Hypomagnesemia  Elevated liver enzymes    Rx / DC Orders ED Discharge Orders     None         Marcello Fennel, PA-C 05/01/21 2226    Milton Ferguson, MD 05/02/21 [phone]

## 2021-05-02 DIAGNOSIS — R748 Abnormal levels of other serum enzymes: Secondary | ICD-10-CM | POA: Diagnosis not present

## 2021-05-02 LAB — COMPREHENSIVE METABOLIC PANEL
ALT: 174 U/L — ABNORMAL HIGH (ref 0–44)
AST: 134 U/L — ABNORMAL HIGH (ref 15–41)
Albumin: 3.5 g/dL (ref 3.5–5.0)
Alkaline Phosphatase: 184 U/L — ABNORMAL HIGH (ref 38–126)
Anion gap: 6 (ref 5–15)
BUN: 10 mg/dL (ref 8–23)
CO2: 27 mmol/L (ref 22–32)
Calcium: 9.2 mg/dL (ref 8.9–10.3)
Chloride: 104 mmol/L (ref 98–111)
Creatinine, Ser: 0.81 mg/dL (ref 0.44–1.00)
GFR, Estimated: >60 mL/min (ref >60)
Glucose, Bld: 137 mg/dL — ABNORMAL HIGH (ref 70–99)
Potassium: 3.6 mmol/L (ref 3.5–5.1)
Sodium: 137 mmol/L (ref 135–145)
Total Bilirubin: 0.9 mg/dL (ref 0.3–1.2)
Total Protein: 7.8 g/dL (ref 6.5–8.1)

## 2021-05-02 LAB — HEMOGLOBIN A1C
Hgb A1c MFr Bld: 8.2 % — ABNORMAL HIGH (ref 4.8–5.6)
Mean Plasma Glucose: 188.64 mg/dL

## 2021-05-02 LAB — GLUCOSE, CAPILLARY
Glucose-Capillary: 134 mg/dL — ABNORMAL HIGH (ref 70–99)
Glucose-Capillary: 143 mg/dL — ABNORMAL HIGH (ref 70–99)
Glucose-Capillary: 147 mg/dL — ABNORMAL HIGH (ref 70–99)
Glucose-Capillary: 156 mg/dL — ABNORMAL HIGH (ref 70–99)

## 2021-05-02 LAB — CBC
HCT: 41.4 % (ref 36.0–46.0)
Hemoglobin: 13.6 g/dL (ref 12.0–15.0)
MCH: 29.4 pg (ref 26.0–34.0)
MCHC: 32.9 g/dL (ref 30.0–36.0)
MCV: 89.6 fL (ref 80.0–100.0)
Platelets: 217 10*3/uL (ref 150–400)
RBC: 4.62 MIL/uL (ref 3.87–5.11)
RDW: 13.5 % (ref 11.5–15.5)
WBC: 7 10*3/uL (ref 4.0–10.5)
nRBC: 0 % (ref 0.0–0.2)

## 2021-05-02 LAB — HEPATITIS PANEL, ACUTE
HCV Ab: NONREACTIVE
Hep A IgM: NONREACTIVE
Hep B C IgM: NONREACTIVE
Hepatitis B Surface Ag: NONREACTIVE

## 2021-05-02 LAB — CBG MONITORING, ED: Glucose-Capillary: 139 mg/dL — ABNORMAL HIGH (ref 70–99)

## 2021-05-02 MED ORDER — BISACODYL 10 MG RE SUPP
10.0000 mg | Freq: Once | RECTAL | Status: AC
  Administered 2021-05-02: 10 mg via RECTAL
  Filled 2021-05-02: qty 1

## 2021-05-02 MED ORDER — POLYETHYLENE GLYCOL 3350 17 G PO PACK
17.0000 g | PACK | Freq: Two times a day (BID) | ORAL | Status: DC
  Administered 2021-05-02: 17 g via ORAL
  Filled 2021-05-02 (×2): qty 1

## 2021-05-02 MED ORDER — FENTANYL CITRATE PF 50 MCG/ML IJ SOSY
50.0000 ug | PREFILLED_SYRINGE | Freq: Once | INTRAMUSCULAR | Status: AC
  Administered 2021-05-02: 50 ug via INTRAVENOUS
  Filled 2021-05-02: qty 1

## 2021-05-02 MED ORDER — SODIUM CHLORIDE 0.9 % IV SOLN: INTRAVENOUS | Status: DC

## 2021-05-02 MED ORDER — LACTULOSE 10 GM/15ML PO SOLN
30.0000 g | Freq: Once | ORAL | Status: AC
  Administered 2021-05-02: 30 g via ORAL
  Filled 2021-05-02: qty 60

## 2021-05-02 MED ORDER — KETOROLAC TROMETHAMINE 15 MG/ML IJ SOLN: 15.0000 mg | Freq: Four times a day (QID) | INTRAMUSCULAR | Status: DC | PRN

## 2021-05-02 MED ORDER — LACTULOSE 10 GM/15ML PO SOLN
30.0000 g | Freq: Once | ORAL | Status: AC
Start: 2021-05-02 — End: 2021-05-02
  Administered 2021-05-02: 30 g via ORAL
  Filled 2021-05-02: qty 60

## 2021-05-02 MED ORDER — ACETAMINOPHEN 325 MG PO TABS
650.0000 mg | ORAL_TABLET | Freq: Four times a day (QID) | ORAL | Status: DC | PRN
  Administered 2021-05-02: 650 mg via ORAL
  Filled 2021-05-02 (×2): qty 2

## 2021-05-02 MED ORDER — KETOROLAC TROMETHAMINE 30 MG/ML IJ SOLN
30.0000 mg | Freq: Once | INTRAMUSCULAR | Status: AC
  Administered 2021-05-02: 30 mg via INTRAVENOUS
  Filled 2021-05-02: qty 1

## 2021-05-02 NOTE — Progress Notes (Addendum)
PROGRESS NOTE     Jessica Hoover, is a 76 y.o. female, DOB - July 10, 1945, MVH:[id number]  Admit date - 05/01/2021   Admitting Physician Onnie Boer, MD  Outpatient Primary MD for the patient is Jessica Robert, MD  LOS - 0  Chief Complaint  Patient presents with   Back Pain        Brief Narrative:  76 year old female who is status post prior cholecystectomy, as well as history of DM2, HTN and obesity who is admitted on 04/2021 with abdominal pain and intractable emesis. -Last BM 2 weeks ago   Assessment & Plan:   1)Persistent abdominal pain with intractable emesis--No BM in at least 2 weeks- -CT abdomen and pelvis without acute findings -No fevers, no leukocytosis -Laxatives, antiemetics and pain medication as ordered -Pain control remains challenging, IV Toradol not effective, okay to give IV fentanyl -intractable abdominal pain requiring IV pain control, intractable emesis requiring IV fluids and IV antiemetics   2)Hepatic Steatosis and Possible Liver Cirrhosis----- -Elevated LFTs noted -Acetaminophen level not elevated -no history of EtOH use -??  Fatty liver -Acute viral hepatitis profile is negative -Check fasting lipid profile in a.m. -Hold Lipitor for now -will Need outpatient follow-up with GI -Abdominal CT liver ultrasound with acute findings except  hepatic steatosis and status postcholecystectomy and possible liver cirrhosis  3)DM2-A1c 8.2 reflecting uncontrolled DM with hyperglycemia PTA -Use Novolog/Humalog Sliding scale insulin with Accu-Cheks/Fingersticks as ordered   4)HTN--- continue amlodipine,  Disposition/Need for in-Hospital Stay- patient unable to be discharged at this time due to --intractable abdominal pain requiring IV pain control, intractable emesis requiring IV fluids and IV antiemetics  Disposition: The patient is from: Home              Anticipated d/c is to: Home              Anticipated d/c date is: 1 day              Patient  currently is not medically stable to d/c. Barriers: Not Clinically Stable-   Code Status :  -  Code Status: Full Code   Family Communication:    (patient is alert, awake and coherent) Discussed with daughters Vernona Rieger and Malachi Bonds at bedside  Consults  :  Na  DVT Prophylaxis  :   - SCDs  enoxaparin (LOVENOX) injection 40 mg Start: 05/02/21 0600    Lab Results  Component Value Date   PLT 217 05/02/2021    Inpatient Medications  Scheduled Meds:  amLODipine  10 mg Oral Daily   enoxaparin (LOVENOX) injection  40 mg Subcutaneous Q24H   insulin aspart  0-9 Units Subcutaneous Q6H   lactulose  30 g Oral Once   polyethylene glycol  17 g Oral BID   Continuous Infusions: PRN Meds:.acetaminophen, ondansetron **OR** ondansetron (ZOFRAN) IV, polyethylene glycol   Anti-infectives (From admission, onward)    None       Subjective: Dehlia Weinhardt today has no fevers,   No chest pain,    -Pain control remains challenging, IV Toradol not effective, okay to give IV fentanyl -intractable abdominal pain requiring IV pain control, intractable emesis requiring IV fluids and IV antiemetics    Objective: Vitals:   05/02/21 0330 05/02/21 0630 05/02/21 0755 05/02/21 1207  BP: (!) 144/76 (!) 157/74 (!) 164/87 (!) 156/72  Pulse: 68 66 79 85  Resp: 14 14 18 18   Temp:   98.7 F (37.1 C) 98.7 F (37.1 C)  TempSrc:   Oral Oral  SpO2: 95% 95% 100% 97%  Weight:   85 kg   Height:   5\' 4"  (1.626 m)     Intake/Output Summary (Last 24 hours) at 05/02/2021 1639 Last data filed at 05/02/2021 1439 Gross per 24 hour  Intake 1774.87 ml  Output --  Net 1774.87 ml   Filed Weights   05/01/21 1230 05/02/21 0755  Weight: 85 kg 85 kg     Physical Exam  Gen:- Awake Alert,  in no apparent distress  HEENT:- [place].AT, No sclera icterus Neck-Supple Neck,No JVD,.  Lungs-  CTAB , fair symmetrical air movement CV- S1, S2 normal, regular  Abd-  +ve B.Sounds, Abd Soft, increased truncal adiposity, generalized  tenderness,    Extremity/Skin:- No  edema, pedal pulses present  Psych-affect is appropriate, oriented x3 Neuro-no new focal deficits, no tremors  Data Reviewed: I have personally reviewed following labs and imaging studies  CBC: Recent Labs  Lab 05/01/21 1508 05/02/21 0355  WBC 8.4 7.0  NEUTROABS 5.1  --   HGB 13.9 13.6  HCT 43.1 41.4  MCV 90.2 89.6  PLT 217 217   Basic Metabolic Panel: Recent Labs  Lab 05/01/21 1508 05/01/21 1737 05/02/21 0355  NA 136  --  137  K 2.8*  --  3.6  CL 101  --  104  CO2 25  --  27  GLUCOSE 212*  --  137*  BUN 13  --  10  CREATININE 0.91  --  0.81  CALCIUM 9.2  --  9.2  MG  --  1.6*  --    GFR: Estimated Creatinine Clearance: 63.3 mL/min (by C-G formula based on SCr of 0.81 mg/dL). Liver Function Tests: Recent Labs  Lab 05/01/21 1508 05/02/21 0355  AST 166* 134*  ALT 205* 174*  ALKPHOS 200* 184*  BILITOT 1.3* 0.9  PROT 8.2* 7.8  ALBUMIN 3.8 3.5   Recent Labs  Lab 05/01/21 1508  LIPASE 47   No results for input(s): AMMONIA in the last 168 hours. Coagulation Profile: No results for input(s): INR, PROTIME in the last 168 hours. Cardiac Enzymes: No results for input(s): CKTOTAL, CKMB, CKMBINDEX, TROPONINI in the last 168 hours. BNP (last 3 results) No results for input(s): PROBNP in the last 8760 hours. HbA1C: Recent Labs    05/02/21 0355  HGBA1C 8.2*   CBG: Recent Labs  Lab 05/01/21 2259 05/02/21 0435 05/02/21 1114  GLUCAP 171* 139* 156*   Lipid Profile: No results for input(s): CHOL, HDL, LDLCALC, TRIG, CHOLHDL, LDLDIRECT in the last 72 hours. Thyroid Function Tests: No results for input(s): TSH, T4TOTAL, FREET4, T3FREE, THYROIDAB in the last 72 hours. Anemia Panel: No results for input(s): VITAMINB12, FOLATE, FERRITIN, TIBC, IRON, RETICCTPCT in the last 72 hours. Urine analysis:    Component Value Date/Time   COLORURINE STRAW (A) 05/01/2021 1854   APPEARANCEUR CLEAR 05/01/2021 1854   LABSPEC 1.027  05/01/2021 1854   PHURINE 7.0 05/01/2021 1854   GLUCOSEU 50 (A) 05/01/2021 1854   HGBUR NEGATIVE 05/01/2021 1854   BILIRUBINUR NEGATIVE 05/01/2021 1854   KETONESUR NEGATIVE 05/01/2021 1854   PROTEINUR NEGATIVE 05/01/2021 1854   UROBILINOGEN 1.0 10/31/2014 2200   NITRITE NEGATIVE 05/01/2021 1854   LEUKOCYTESUR NEGATIVE 05/01/2021 1854   Sepsis Labs: @LABRCNTIP (procalcitonin:4,lacticidven:4)  ) Recent Results (from the past 240 hour(s))  Resp Panel by RT-PCR (Flu A&B, Covid) Nasopharyngeal Swab     Status: None   Collection Time: 05/01/21  6:45 PM   Specimen: Nasopharyngeal Swab; Nasopharyngeal(NP) swabs in vial transport  medium  Result Value Ref Range Status   SARS Coronavirus 2 by RT PCR NEGATIVE NEGATIVE Final    Comment: (NOTE) SARS-CoV-2 target nucleic acids are NOT DETECTED.  The SARS-CoV-2 RNA is generally detectable in upper respiratory specimens during the acute phase of infection. The lowest concentration of SARS-CoV-2 viral copies this assay can detect is 138 copies/mL. A negative result does not preclude SARS-Cov-2 infection and should not be used as the sole basis for treatment or other patient management decisions. A negative result may occur with  improper specimen collection/handling, submission of specimen other than nasopharyngeal swab, presence of viral mutation(s) within the areas targeted by this assay, and inadequate number of viral copies(<138 copies/mL). A negative result must be combined with clinical observations, patient history, and epidemiological information. The expected result is Negative.  Fact Sheet for Patients:  BloggerCourse.com  Fact Sheet for Healthcare Providers:  SeriousBroker.it  This test is no t yet approved or cleared by the [place] FDA and  has been authorized for detection and/or diagnosis of SARS-CoV-2 by FDA under an Emergency Use Authorization (EUA). This EUA will remain   in effect (meaning this test can be used) for the duration of the COVID-19 declaration under Section 564(b)(1) of the Act, 21 U.S.C.section 360bbb-3(b)(1), unless the authorization is terminated  or revoked sooner.       Influenza A by PCR NEGATIVE NEGATIVE Final   Influenza B by PCR NEGATIVE NEGATIVE Final    Comment: (NOTE) The Xpert Xpress SARS-CoV-2/FLU/RSV plus assay is intended as an aid in the diagnosis of influenza from Nasopharyngeal swab specimens and should not be used as a sole basis for treatment. Nasal washings and aspirates are unacceptable for Xpert Xpress SARS-CoV-2/FLU/RSV testing.  Fact Sheet for Patients: BloggerCourse.com  Fact Sheet for Healthcare Providers: SeriousBroker.it  This test is not yet approved or cleared by the [place] FDA and has been authorized for detection and/or diagnosis of SARS-CoV-2 by FDA under an Emergency Use Authorization (EUA). This EUA will remain in effect (meaning this test can be used) for the duration of the COVID-19 declaration under Section 564(b)(1) of the Act, 21 U.S.C. section 360bbb-3(b)(1), unless the authorization is terminated or revoked.  Performed at [facility], [address], [place], [place] [address]       Radiology Studies: CT ABDOMEN PELVIS W CONTRAST  Result Date: 05/01/2021 CLINICAL DATA:  Nausea and vomiting EXAM: CT ABDOMEN AND PELVIS WITH CONTRAST TECHNIQUE: Multidetector CT imaging of the abdomen and pelvis was performed using the standard protocol following bolus administration of intravenous contrast. RADIATION DOSE REDUCTION: This exam was performed according to the departmental dose-optimization program which includes automated exposure control, adjustment of the mA and/or kV according to patient size and/or use of iterative reconstruction technique. CONTRAST:  OMNIPAQUE IOHEXOL 300 MG/ML  SOLN COMPARISON:  CT abdomen and pelvis dated October 06, 2018 FINDINGS: Lower chest: Right lower lobe linear opacity which is likely due to atelectasis. Hepatobiliary: Mildly nodular liver contour no focal liver abnormality is seen. Status post cholecystectomy. No biliary dilatation. Pancreas: Unremarkable. No pancreatic ductal dilatation or surrounding inflammatory changes. Spleen: Normal in size without focal abnormality. Adrenals/Urinary Tract: Cyst bilateral adrenal glands are unremarkable. Kidneys enhance symmetrically with no evidence of hydronephrosis renal vascular calcifications with no definite nephrolithiasis. Visualized bladder is unremarkable. Stomach/Bowel: Stomach is within normal limits. Appendix appears normal. Diverticulosis. No evidence of bowel wall thickening, distention, or inflammatory changes. Vascular/Lymphatic: Aortic atherosclerosis. No enlarged abdominal or pelvic lymph nodes. Reproductive:  Status post hysterectomy. No adnexal masses. Other: Tiny fat containing umbilical hernia. No abdominopelvic ascites Musculoskeletal: Bilateral hip arthroplasties. No acute intracranial abnormality. IMPRESSION: 1. No acute findings in the abdomen or pelvis. 2. Diverticulosis with no evidence of diverticulitis. 3. Mildly nodular liver contour which is suggestive of cirrhosis. 4.  Aortic Atherosclerosis (ICD10-I70.0). Electronically Signed   By: Allegra LaiLeah  Strickland M.D.   On: 05/01/2021 16:55   US Abdomen Limited  Result Date: 05/01/2021 CLINICAL DATA:  Right upper quadrant pain. EXAM: ULTRASOUND ABDOMEN LIMITED RIGHT UPPER QUADRANT COMPARISON:  CT abdomen pelvis dated October 06, 2018. FINDINGS: Gallbladder: Surgically absent. Common bile duct: Diameter: 5 mm, normal. Liver: No focal lesion identified. Heterogeneously increased parenchymal echogenicity. Portal vein is patent on color Doppler imaging with normal direction of blood flow towards the liver. Other: None. IMPRESSION: 1. No acute abnormality. 2. Unchanged hepatic steatosis. Electronically Signed    By: Obie DredgeWilliam T Derry M.D.   On: 05/01/2021 16:47    Scheduled Meds:  amLODipine  10 mg Oral Daily   enoxaparin (LOVENOX) injection  40 mg Subcutaneous Q24H   insulin aspart  0-9 Units Subcutaneous Q6H   lactulose  30 g Oral Once   polyethylene glycol  17 g Oral BID   Continuous Infusions:   LOS: 0 days   Shon Haleourage [place] Kretz M.D on 05/02/2021 at 4:39 PM  Go to [email or web address] - for contact info  [facility] - Office  [phone]  If 7PM-7AM, please contact night-coverage [email or web address] Password [email or web address] 05/02/2021, 4:39 PM

## 2021-05-02 NOTE — Progress Notes (Addendum)
°  Transition of Care [facility]) Screening Note   Patient Details  Name: REILYN PEGGS Date of Birth: 07-Jul-1945   [facility]) CM/SW Contact:    Boneta Lucks, RN Phone Number: 05/02/2021, 3:43 PM  In [place] home - no needs.   Transition of Care Department [facility]) has reviewed patient and no TOC needs have been identified at this time. We will continue to monitor patient advancement through interdisciplinary progression rounds. If new patient transition needs arise, please place a TOC consult.

## 2021-05-02 NOTE — Care Management Obs Status (Signed)
MEDICARE OBSERVATION STATUS NOTIFICATION   Patient Details  Name: Jessica Hoover MRN: [id number] Date of Birth: 13-Feb-1946   Medicare Observation Status Notification Given:  Yes    Corey Harold 05/02/2021, 4:40 PM

## 2021-05-03 ENCOUNTER — Observation Stay (HOSPITAL_COMMUNITY): Payer: Medicare HMO

## 2021-05-03 DIAGNOSIS — R748 Abnormal levels of other serum enzymes: Secondary | ICD-10-CM | POA: Diagnosis not present

## 2021-05-03 LAB — COMPREHENSIVE METABOLIC PANEL
ALT: 146 U/L — ABNORMAL HIGH (ref 0–44)
AST: 114 U/L — ABNORMAL HIGH (ref 15–41)
Albumin: 3.4 g/dL — ABNORMAL LOW (ref 3.5–5.0)
Alkaline Phosphatase: 175 U/L — ABNORMAL HIGH (ref 38–126)
Anion gap: 6 (ref 5–15)
BUN: 8 mg/dL (ref 8–23)
CO2: 25 mmol/L (ref 22–32)
Calcium: 9.1 mg/dL (ref 8.9–10.3)
Chloride: 104 mmol/L (ref 98–111)
Creatinine, Ser: 0.8 mg/dL (ref 0.44–1.00)
GFR, Estimated: >60 mL/min (ref >60)
Glucose, Bld: 132 mg/dL — ABNORMAL HIGH (ref 70–99)
Potassium: 3.3 mmol/L — ABNORMAL LOW (ref 3.5–5.1)
Sodium: 135 mmol/L (ref 135–145)
Total Bilirubin: 1.4 mg/dL — ABNORMAL HIGH (ref 0.3–1.2)
Total Protein: 7.5 g/dL (ref 6.5–8.1)

## 2021-05-03 LAB — GLUCOSE, CAPILLARY
Glucose-Capillary: 126 mg/dL — ABNORMAL HIGH (ref 70–99)
Glucose-Capillary: 186 mg/dL — ABNORMAL HIGH (ref 70–99)

## 2021-05-03 LAB — LIPID PANEL
Cholesterol: 107 mg/dL (ref 0–200)
HDL: 29 mg/dL — ABNORMAL LOW (ref >40)
LDL Cholesterol: 64 mg/dL (ref 0–99)
Total CHOL/HDL Ratio: 3.7 RATIO
Triglycerides: 69 mg/dL (ref <150)
VLDL: 14 mg/dL (ref 0–40)

## 2021-05-03 LAB — TSH: TSH: 3.336 u[IU]/mL (ref 0.350–4.500)

## 2021-05-03 MED ORDER — HYDROCORTISONE ACETATE 25 MG RE SUPP: 25.0000 mg | Freq: Two times a day (BID) | RECTAL | 0 refills | Status: AC

## 2021-05-03 MED ORDER — AMLODIPINE BESYLATE 10 MG PO TABS: 10.0000 mg | ORAL_TABLET | Freq: Every day | ORAL | 3 refills | Status: DC

## 2021-05-03 MED ORDER — ASPIRIN EC 81 MG PO TBEC: 81.0000 mg | DELAYED_RELEASE_TABLET | Freq: Every day | ORAL | 3 refills | Status: DC

## 2021-05-03 MED ORDER — POTASSIUM CHLORIDE CRYS ER 20 MEQ PO TBCR
40.0000 meq | EXTENDED_RELEASE_TABLET | ORAL | Status: AC
  Administered 2021-05-03 (×2): 40 meq via ORAL
  Filled 2021-05-03 (×2): qty 2

## 2021-05-03 MED ORDER — SITAGLIPTIN PHOSPHATE 100 MG PO TABS: 100.0000 mg | ORAL_TABLET | Freq: Every day | ORAL | 3 refills | Status: DC

## 2021-05-03 MED ORDER — POLYETHYLENE GLYCOL 3350 17 G PO PACK: 17.0000 g | PACK | Freq: Every day | ORAL | 0 refills | Status: DC

## 2021-05-03 NOTE — Discharge Instructions (Signed)
1)follow up with your gastroenterologist Dr. Allena Katz in [place]--- scheduled for colonoscopy in March 2023 -Please also discuss with Dr. Allena Katz your gastroenterologist regarding findings of possible liver cirrhosis 2) repeat liver function test/CMP blood test advised in 1 to 2 weeks 3) hold Lipitor/atorvastatin for now until your liver function test is repeated 4) use Anusol HC suppositories as prescribed for hemorrhoids 5) avoid constipation 6) your diabetes is not well controlled--- hemoglobin A1C is 8.2 this should be less than 7

## 2021-05-03 NOTE — Discharge Summary (Signed)
Jessica Hoover, is a 76 y.o. female  DOB 1945-10-02  MRN [id number].  Admission date:  05/01/2021  Admitting Physician  Bethena Roys, MD  Discharge Date:  05/03/2021   Primary MD  Vidal Schwalbe, MD  Recommendations for primary care physician for things to follow:   1)follow up with your gastroenterologist Dr. Posey Pronto in [place]--- scheduled for colonoscopy in March 2023 -Please also discuss with Dr. Posey Pronto your gastroenterologist regarding findings of possible liver cirrhosis 2) repeat liver function test/CMP blood test advised in 1 to 2 weeks 3) hold Lipitor/atorvastatin for now until your liver function test is repeated 4) use Anusol HC suppositories as prescribed for hemorrhoids 5) avoid constipation 6) your diabetes is not well controlled--- hemoglobin A1C is 8.2 this should be less than 7   Admission Diagnosis  Hypokalemia [E87.6] Hypomagnesemia [E83.42] Elevated liver enzymes [R74.8]   Discharge Diagnosis  Hypokalemia [E87.6] Hypomagnesemia [E83.42] Elevated liver enzymes [R74.8]    Principal Problem:   Elevated liver enzymes Active Problems:   Essential hypertension   Electrolyte abnormality   Diabetes ([place])      Past Medical History:  Diagnosis Date   Arthritis    Chronic back pain    Chronic pelvic pain in female    pelvic joint and thigh   Chronic right hip pain    Chronic shoulder pain    Frequency of urination    Heart murmur    Hypertension    Lumbago    Neuralgia and neuritis    Osteoarthritis    Pneumonia    hx of   PONV (postoperative nausea and vomiting)    Radiculitis    Sciatica    Seizures ([place])    had seizures as a child    Past Surgical History:  Procedure Laterality Date   ABDOMINAL HYSTERECTOMY     partial   BACK SURGERY     CHOLECYSTECTOMY     COLONOSCOPY W/ POLYPECTOMY     JOINT REPLACEMENT     TOTAL HIP ARTHROPLASTY Right 10/26/2014    Procedure: TOTAL HIP ARTHROPLASTY;  Surgeon: Garald Balding, MD;  Location: [facility];  Service: Orthopedics;  Laterality: Right;   TOTAL HIP ARTHROPLASTY Left 10/09/2016   TOTAL HIP ARTHROPLASTY Left 10/09/2016   Procedure: LEFT TOTAL HIP ARTHROPLASTY;  Surgeon: Garald Balding, MD;  Location: [facility];  Service: Orthopedics;  Laterality: Left;      HPI  from the history and physical done on the day of admission:   Chief Complaint: Abdominal pain, vomiting   HPI: Jessica Hoover is a 76 y.o. female with medical history significant for hypertension, diabetes.  Patient presented to the ED with complaints of right sided, upper abdominal pain that started yesterday at about 2 PM.  She reports abdominal pain was so severe she could not move.  She reports associated multiple episodes of vomiting.  No loose stools.  She tells me she rarely ever takes any medication including Tylenol, she took only 1 tablet of tylenol once as  pain was too severe.  Denies aspirin use.  Denies alcohol use.   Patient tells me about 2 weeks ago, her primary care provider had discontinued one of her medications which she was has been on for at least 2 years she thinks the medications was for diabetes.  She was told the medication had caused her liver to be abnormal.  She was placed on a second medication, she has not started this new medication and does not know the name of either of these medications.   ED Course: Pressure 98.1.  Heart rate 70-87.  Respirate rate 16-18.  Blood pressure systolic [id number] to [id number].  O2 sat greater than 92% on room air.  Potassium 2.8.  Magnesium 6.  Lactic acid 2.5 > 1.9.  UA not suggestive of UTI. CT abdomen and pelvis with contrast without acute abnormality, suggests cirrhosis. RUQ ultrasound-hepatic steatosis. EDP talked with Dr. Abbey Chatters, recommended admission fluids replete electrolytes to see tomorrow.   Review of Systems: As per HPI all other systems reviewed and negative     Hospital Course:      Assessment and Plan: Brief Narrative:  76 year old female who is status post prior cholecystectomy, as well as history of DM2, HTN and obesity who is admitted on 04/2021 with abdominal pain and intractable emesis. -Last BM 2 weeks PTA     Assessment & Plan:   1)Persistent abdominal pain with intractable emesis--No BM in at least 2 weeks- -CT abdomen and pelvis done after pt had multiple BMs with Laxatives without acute findings -No fevers, no leukocytosis -improved with Laxatives, antiemetics and pain medication --abd pain and nausea resolved -?? 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  Fatty liver -Acute viral hepatitis profile is negative -LDL -64, HDL 29 -Hold Lipitor for now until LFTs are repeated as outpt -will Need outpatient follow-up with GI -Abdominal CT liver ultrasound with acute findings except  hepatic steatosis and status postcholecystectomy and possible liver cirrhosis   3)DM2-A1c 8.2 reflecting uncontrolled DM with hyperglycemia PTA   4)HTN--- continue amlodipine,   Disposition---home   Disposition: The patient is from: Home              Anticipated d/c is to: Home  Discharge Condition: stable  Diet and Activity recommendation:  As advised  Discharge Instructions    Discharge Instructions     Diet - low sodium heart healthy   Complete by: As directed    Discharge instructions   Complete by: As directed    1)follow up with your gastroenterologist Dr. Posey Pronto in [place]--- scheduled for colonoscopy in March 2023 -Please also discuss with Dr. Posey Pronto your gastroenterologist regarding findings of possible liver cirrhosis 2) repeat liver function test/CMP blood test advised in 1 to 2 weeks 3) hold Lipitor/atorvastatin for now until  your liver function test is repeated 4) use Anusol HC suppositories as prescribed for hemorrhoids 5) avoid constipation 6) your diabetes is not well controlled--- hemoglobin A1C is 8.2 this should be less than 7   Increase activity slowly   Complete by: As directed        Discharge Medications     Allergies as of 05/03/2021       Reactions   Clarithromycin Shortness Of Breath   Codeine Shortness Of Breath   Hydrocodone Shortness Of Breath   Penicillins  Shortness Of Breath, Other (See Comments)   PATIENT HAS HAD A PCN REACTION WITH IMMEDIATE RASH, FACIAL/TONGUE/THROAT SWELLING, SOB, OR LIGHTHEADEDNESS WITH HYPOTENSION:  #  #  #  YES  #  #  #   Has patient had a PCN reaction causing severe rash involving mucus membranes or skin necrosis: No Has patient had a PCN reaction that required hospitalization: No Has patient had a PCN reaction occurring within the last 10 years: No   Sulfa Antibiotics Shortness Of Breath   Dilaudid [hydromorphone Hcl] Other (See Comments)   ORAL SORES   Oxycodone Hcl Nausea Only        Medication List     STOP taking these medications    colchicine 0.6 MG tablet       TAKE these medications    amLODipine 10 MG tablet Commonly known as: NORVASC Take 1 tablet (10 mg total) by mouth daily.   aspirin EC 81 MG tablet Take 1 tablet (81 mg total) by mouth daily with breakfast. What changed: when to take this   diclofenac Sodium 1 % Gel Commonly known as: Voltaren Apply 2 g topically 4 (four) times daily.   febuxostat 40 MG tablet Commonly known as: Uloric One daily for gout   hydrocortisone 25 MG suppository Commonly known as: ANUSOL-HC Place 1 suppository (25 mg total) rectally every 12 (twelve) hours for 6 days.   polyethylene glycol 17 g packet Commonly known as: MIRALAX / GLYCOLAX Take 17 g by mouth daily. For constipation   sitaGLIPtin 100 MG tablet Commonly known as: JANUVIA Take 1 tablet (100 mg total) by mouth daily. What  changed:  how much to take how to take this when to take this        Major procedures and Radiology Reports - PLEASE review detailed and final reports for all details, in brief -   CT ABDOMEN PELVIS W CONTRAST  Result Date: 05/01/2021 CLINICAL DATA:  Nausea and vomiting EXAM: CT ABDOMEN AND PELVIS WITH CONTRAST TECHNIQUE: Multidetector CT imaging of the abdomen and pelvis was performed using the standard protocol following bolus administration of intravenous contrast. RADIATION DOSE REDUCTION: This exam was performed according to the departmental dose-optimization program which includes automated exposure control, adjustment of the mA and/or kV according to patient size and/or use of iterative reconstruction technique. CONTRAST:  140mL OMNIPAQUE IOHEXOL 300 MG/ML  SOLN COMPARISON:  CT abdomen and pelvis dated October 06, 2018 FINDINGS: Lower chest: Right lower lobe linear opacity which is likely due to atelectasis. Hepatobiliary: Mildly nodular liver contour no focal liver abnormality is seen. Status post cholecystectomy. No biliary dilatation. Pancreas: Unremarkable. No pancreatic ductal dilatation or surrounding inflammatory changes. Spleen: Normal in size without focal abnormality. Adrenals/Urinary Tract: Cyst bilateral adrenal glands are unremarkable. Kidneys enhance symmetrically with no evidence of hydronephrosis renal vascular calcifications with no definite nephrolithiasis. Visualized bladder is unremarkable. Stomach/Bowel: Stomach is within normal limits. Appendix appears normal. Diverticulosis. No evidence of bowel wall thickening, distention, or inflammatory changes. Vascular/Lymphatic: Aortic atherosclerosis. No enlarged abdominal or pelvic lymph nodes. Reproductive: Status post hysterectomy. No adnexal masses. Other: Tiny fat containing umbilical hernia. No abdominopelvic ascites Musculoskeletal: Bilateral hip arthroplasties. No acute intracranial abnormality. IMPRESSION: 1. No acute findings  in the abdomen or pelvis. 2. Diverticulosis with no evidence of diverticulitis. 3. Mildly nodular liver contour which is suggestive of cirrhosis. 4.  Aortic Atherosclerosis (ICD10-I70.0). Electronically Signed   By: Yetta Glassman M.D.   On: 05/01/2021 16:55   US Abdomen Limited  Result  Date: 05/01/2021 CLINICAL DATA:  Right upper quadrant pain. EXAM: ULTRASOUND ABDOMEN LIMITED RIGHT UPPER QUADRANT COMPARISON:  CT abdomen pelvis dated October 06, 2018. FINDINGS: Gallbladder: Surgically absent. Common bile duct: Diameter: 5 mm, normal. Liver: No focal lesion identified. Heterogeneously increased parenchymal echogenicity. Portal vein is patent on color Doppler imaging with normal direction of blood flow towards the liver. Other: None. IMPRESSION: 1. No acute abnormality. 2. Unchanged hepatic steatosis. Electronically Signed   By: Obie Dredge M.D.   On: 05/01/2021 16:47   DG Abd 2 Views  Result Date: 05/03/2021 CLINICAL DATA:  Abdominal pain. Intractable emesis. No bowel movement for 2 weeks. EXAM: ABDOMEN - 2 VIEW COMPARISON:  CT of the abdomen and pelvis 05/01/2021. CT chest 10/04/2016 FINDINGS: Scattered opacities are present in the right lung. These were not present on the prior chest CT. The left lung is clear. Heart size is exaggerated by low lung volumes. Bowel gas pattern is unremarkable. No obstruction or free air is present. Cholecystectomy clips are noted. Bilateral hip replacements are present. Axial skeleton is otherwise unremarkable. IMPRESSION: 1. Scattered opacities in the right lung concerning for pneumonia. 2. No acute intra-abdominal abnormality. Normal bowel gas pattern. No obstruction or constipation. Electronically Signed   By: Marin Roberts M.D.   On: 05/03/2021 06:17    Micro Results   Recent Results (from the past 240 hour(s))  Resp Panel by RT-PCR (Flu A&B, Covid) Nasopharyngeal Swab     Status: None   Collection Time: 05/01/21  6:45 PM   Specimen: Nasopharyngeal Swab;  Nasopharyngeal(NP) swabs in vial transport medium  Result Value Ref Range Status   SARS Coronavirus 2 by RT PCR NEGATIVE NEGATIVE Final    Comment: (NOTE) SARS-CoV-2 target nucleic acids are NOT DETECTED.  The SARS-CoV-2 RNA is generally detectable in upper respiratory specimens during the acute phase of infection. The lowest concentration of SARS-CoV-2 viral copies this assay can detect is 138 copies/mL. A negative result does not preclude SARS-Cov-2 infection and should not be used as the sole basis for treatment or other patient management decisions. A negative result may occur with  improper specimen collection/handling, submission of specimen other than nasopharyngeal swab, presence of viral mutation(s) within the areas targeted by this assay, and inadequate number of viral copies(<138 copies/mL). A negative result must be combined with clinical observations, patient history, and epidemiological information. The expected result is Negative.  Fact Sheet for Patients:  BloggerCourse.com  Fact Sheet for Healthcare Providers:  SeriousBroker.it  This test is no t yet approved or cleared by the [place] FDA and  has been authorized for detection and/or diagnosis of SARS-CoV-2 by FDA under an Emergency Use Authorization (EUA). This EUA will remain  in effect (meaning this test can be used) for the duration of the COVID-19 declaration under Section 564(b)(1) of the Act, 21 U.S.C.section 360bbb-3(b)(1), unless the authorization is terminated  or revoked sooner.       Influenza A by PCR NEGATIVE NEGATIVE Final   Influenza B by PCR NEGATIVE NEGATIVE Final    Comment: (NOTE) The Xpert Xpress SARS-CoV-2/FLU/RSV plus assay is intended as an aid in the diagnosis of influenza from Nasopharyngeal swab specimens and should not be used as a sole basis for treatment. Nasal washings and aspirates are unacceptable for Xpert Xpress  SARS-CoV-2/FLU/RSV testing.  Fact Sheet for Patients: BloggerCourse.com  Fact Sheet for Healthcare Providers: SeriousBroker.it  This test is not yet approved or cleared by the [place] FDA and has been authorized for detection  and/or diagnosis of SARS-CoV-2 by FDA under an Emergency Use Authorization (EUA). This EUA will remain in effect (meaning this test can be used) for the duration of the COVID-19 declaration under Section 564(b)(1) of the Act, 21 U.S.C. section 360bbb-3(b)(1), unless the authorization is terminated or revoked.  Performed at [facility], [address] [place], [place] [address]    Today   Subjective    Jessica Hoover today has no new complaints    --abd pain and nausea resolved No fever  Or chills  No further hematuria     Patient has been seen and examined prior to discharge   Objective   Blood pressure (!) 156/79, pulse 76, temperature 97.8 F (36.6 C), temperature source Oral, resp. rate 18, height 5\' 4"  (1.626 m), weight 85 kg, SpO2 97 %.   Intake/Output Summary (Last 24 hours) at 05/03/2021 1105 Last data filed at 05/03/2021 0900 Gross per 24 hour  Intake 2511.61 ml  Output 800 ml  Net 1711.61 ml   Exam Gen:- Awake Alert, no acute distress  HEENT:- [place].AT, No sclera icterus Neck-Supple Neck,No JVD,.  Lungs-  CTAB , good air movement bilaterally  CV- S1, S2 normal, regular Abd-  +ve B.Sounds, Abd Soft, No tenderness, No CVA Tenderness Extremity/Skin:- No  edema,   good pulses Psych-affect is appropriate, oriented x3 Neuro-no new focal deficits, no tremors    Data Review   CBC w Diff:  Lab Results  Component Value Date   WBC 7.0 05/02/2021   HGB 13.6 05/02/2021   HCT 41.4 05/02/2021   PLT 217 05/02/2021   LYMPHOPCT 31 05/01/2021   MONOPCT 7 05/01/2021   EOSPCT 1 05/01/2021   BASOPCT 1 05/01/2021    CMP:  Lab Results  Component Value Date   NA 135 05/03/2021   K 3.3 (L)  05/03/2021   CL 104 05/03/2021   CO2 25 05/03/2021   BUN 8 05/03/2021   CREATININE 0.80 05/03/2021   PROT 7.5 05/03/2021   ALBUMIN 3.4 (L) 05/03/2021   BILITOT 1.4 (H) 05/03/2021   ALKPHOS 175 (H) 05/03/2021   AST 114 (H) 05/03/2021   ALT 146 (H) 05/03/2021  .   Total Discharge time is about 33 minutes  Roxan Hockey M.D on 05/03/2021 at 11:05 AM  Go to [email or web address] -  for contact info  [facility] - Office  [phone]

## 2021-05-11 ENCOUNTER — Other Ambulatory Visit: Payer: Self-pay

## 2021-05-11 ENCOUNTER — Emergency Department (HOSPITAL_COMMUNITY)
Admission: EM | Admit: 2021-05-11 | Discharge: 2021-05-11 | Disposition: A | Discharge status: Home / Self Care | Payer: Medicare HMO | Attending: Student | Admitting: Student

## 2021-05-11 ENCOUNTER — Emergency Department (HOSPITAL_COMMUNITY): Payer: Medicare HMO

## 2021-05-11 ENCOUNTER — Encounter (HOSPITAL_COMMUNITY): Payer: Self-pay

## 2021-05-11 DIAGNOSIS — I10 Essential (primary) hypertension: Secondary | ICD-10-CM | POA: Insufficient documentation

## 2021-05-11 DIAGNOSIS — Z7982 Long term (current) use of aspirin: Secondary | ICD-10-CM | POA: Diagnosis not present

## 2021-05-11 DIAGNOSIS — M546 Pain in thoracic spine: Secondary | ICD-10-CM | POA: Insufficient documentation

## 2021-05-11 DIAGNOSIS — Z79899 Other long term (current) drug therapy: Secondary | ICD-10-CM | POA: Insufficient documentation

## 2021-05-11 DIAGNOSIS — R109 Unspecified abdominal pain: Secondary | ICD-10-CM | POA: Diagnosis not present

## 2021-05-11 LAB — URINALYSIS, ROUTINE W REFLEX MICROSCOPIC
Bilirubin Urine: NEGATIVE
Glucose, UA: 50 mg/dL — AB
Hgb urine dipstick: NEGATIVE
Ketones, ur: NEGATIVE mg/dL
Leukocytes,Ua: NEGATIVE
Nitrite: NEGATIVE
Protein, ur: NEGATIVE mg/dL
Specific Gravity, Urine: 1.01 (ref 1.005–1.030)
pH: 6 (ref 5.0–8.0)

## 2021-05-11 LAB — COMPREHENSIVE METABOLIC PANEL
ALT: 119 U/L — ABNORMAL HIGH (ref 0–44)
AST: 131 U/L — ABNORMAL HIGH (ref 15–41)
Albumin: 3.4 g/dL — ABNORMAL LOW (ref 3.5–5.0)
Alkaline Phosphatase: 142 U/L — ABNORMAL HIGH (ref 38–126)
Anion gap: 12 (ref 5–15)
BUN: 12 mg/dL (ref 8–23)
CO2: 24 mmol/L (ref 22–32)
Calcium: 9.4 mg/dL (ref 8.9–10.3)
Chloride: 99 mmol/L (ref 98–111)
Creatinine, Ser: 1.08 mg/dL — ABNORMAL HIGH (ref 0.44–1.00)
GFR, Estimated: 54 mL/min — ABNORMAL LOW (ref >60)
Glucose, Bld: 216 mg/dL — ABNORMAL HIGH (ref 70–99)
Potassium: 3.7 mmol/L (ref 3.5–5.1)
Sodium: 135 mmol/L (ref 135–145)
Total Bilirubin: 0.6 mg/dL (ref 0.3–1.2)
Total Protein: 7 g/dL (ref 6.5–8.1)

## 2021-05-11 LAB — CBC WITH DIFFERENTIAL/PLATELET
Abs Immature Granulocytes: 0.02 10*3/uL (ref 0.00–0.07)
Basophils Absolute: 0 10*3/uL (ref 0.0–0.1)
Basophils Relative: 1 %
Eosinophils Absolute: 0.1 10*3/uL (ref 0.0–0.5)
Eosinophils Relative: 2 %
HCT: 41.4 % (ref 36.0–46.0)
Hemoglobin: 13.1 g/dL (ref 12.0–15.0)
Immature Granulocytes: 0 %
Lymphocytes Relative: 35 %
Lymphs Abs: 1.6 10*3/uL (ref 0.7–4.0)
MCH: 28.3 pg (ref 26.0–34.0)
MCHC: 31.6 g/dL (ref 30.0–36.0)
MCV: 89.4 fL (ref 80.0–100.0)
Monocytes Absolute: 0.3 10*3/uL (ref 0.1–1.0)
Monocytes Relative: 6 %
Neutro Abs: 2.6 10*3/uL (ref 1.7–7.7)
Neutrophils Relative %: 56 %
Platelets: 171 10*3/uL (ref 150–400)
RBC: 4.63 MIL/uL (ref 3.87–5.11)
RDW: 13.3 % (ref 11.5–15.5)
WBC: 4.7 10*3/uL (ref 4.0–10.5)
nRBC: 0 % (ref 0.0–0.2)

## 2021-05-11 LAB — LIPASE, BLOOD: Lipase: 39 U/L (ref 11–51)

## 2021-05-11 MED ORDER — METHOCARBAMOL 500 MG PO TABS: 500.0000 mg | ORAL_TABLET | Freq: Two times a day (BID) | ORAL | 0 refills | Status: DC

## 2021-05-11 MED ORDER — LIDOCAINE 5 % EX PTCH
1.0000 | MEDICATED_PATCH | CUTANEOUS | Status: DC
  Administered 2021-05-11: 1 via TRANSDERMAL
  Filled 2021-05-11: qty 1

## 2021-05-11 MED ORDER — SODIUM CHLORIDE 0.9 % IV BOLUS
500.0000 mL | Freq: Once | INTRAVENOUS | Status: AC
  Administered 2021-05-11: 500 mL via INTRAVENOUS

## 2021-05-11 MED ORDER — MORPHINE SULFATE (PF) 4 MG/ML IV SOLN
4.0000 mg | Freq: Once | INTRAVENOUS | Status: AC
  Administered 2021-05-11: 4 mg via INTRAVENOUS
  Filled 2021-05-11: qty 1

## 2021-05-11 MED ORDER — ONDANSETRON HCL 4 MG/2ML IJ SOLN
4.0000 mg | Freq: Once | INTRAMUSCULAR | Status: AC
  Administered 2021-05-11: 4 mg via INTRAVENOUS
  Filled 2021-05-11: qty 2

## 2021-05-11 NOTE — ED Triage Notes (Signed)
Patient complaining of right flank pain for over a week, was admitted for kidney stone.

## 2021-05-11 NOTE — ED Provider Notes (Signed)
[facility] EMERGENCY DEPARTMENT Provider Note   CSN: [id number] Arrival date & time: 05/11/21  1014     History  Chief Complaint  Patient presents with   Flank Pain    Jessica Hoover is a 76 y.o. female.  Jessica Hoover is a 76 y.o. female with a history of hypertension, seizures, chronic back pain, radiculitis, who presents to the emergency department for evaluation of right flank pain for a week. Patient was admitted to the hospital with right upper quadrant pain and hypokalemia.  Reports current pain is similar to the pain she was experiencing in the hospital but is now more in her right mid back and flank rather than in the abdomen.  He is not having any nausea or vomiting at this time.  Denies any blood in her stool.  No fevers or chills.  No chest pain or shortness of breath.  Pain does not radiate into her legs.  No numbness, weakness or tingling.  No associated dysuria, urinary frequency or hematuria.  During recent hospitalization it was also questioned whether or not she could have a kidney stone that was not seen on the initial contrasted CT.  Seen by PCP 2 days ago for this pain and prescribed tamsulosin  for symptomatic treatment of kidney stone at that time.  Patient reports minimal improvement in pain.  The history is provided by the patient and medical records.      Home Medications Prior to Admission medications   Medication Sig Start Date End Date Taking? Authorizing Provider  amLODipine (NORVASC) 10 MG tablet Take 1 tablet (10 mg total) by mouth daily. 05/03/21  Yes Roxan Hockey, MD  aspirin EC 81 MG tablet Take 1 tablet (81 mg total) by mouth daily with breakfast. 05/03/21  Yes Emokpae, Courage, MD  diclofenac Sodium (VOLTAREN) 1 % GEL Apply 2 g topically 4 (four) times daily. 06/15/19  Yes Long, Wonda Olds, MD  methocarbamol (ROBAXIN) 500 MG tablet Take 1 tablet (500 mg total) by mouth 2 (two) times daily. 05/11/21  Yes Jacqlyn Larsen, PA-C  polyethylene glycol (MIRALAX /  GLYCOLAX) 17 g packet Take 17 g by mouth daily. For constipation 05/03/21  Yes Emokpae, Courage, MD  sitaGLIPtin (JANUVIA) 100 MG tablet Take 1 tablet (100 mg total) by mouth daily. 05/03/21  Yes Emokpae, Courage, MD  tamsulosin (FLOMAX) 0.4 MG CAPS capsule Take 0.4 mg by mouth daily. 05/05/21  Yes [provider]  febuxostat (ULORIC) 40 MG tablet One daily for gout Patient not taking: Reported on 05/01/2021 09/01/19   Sanjuana Kava, MD      Allergies    Clarithromycin, Codeine, Hydrocodone, Penicillins, Sulfa antibiotics, Dilaudid [hydromorphone hcl], and Oxycodone hcl    Review of Systems   Review of Systems  Constitutional:  Negative for chills and fever.  HENT: Negative.    Respiratory:  Negative for cough and shortness of breath.   Cardiovascular:  Negative for chest pain.  Gastrointestinal:  Negative for abdominal pain, constipation, diarrhea, nausea and vomiting.  Genitourinary:  Positive for flank pain. Negative for dysuria, frequency and hematuria.  Musculoskeletal:  Positive for back pain.  Neurological:  Negative for dizziness, syncope, weakness, light-headedness and numbness.  All other systems reviewed and are negative.  Physical Exam Updated Vital Signs BP 139/72 (BP Location: Right Arm)    Pulse 74    Temp 98.2 F (36.8 C) (Oral)    Resp 18    Ht 5\' 4"  (1.626 m)    Wt 84.8  kg    SpO2 97%    BMI 32.10 kg/m  Physical Exam Vitals and nursing note reviewed.  Constitutional:      General: She is not in acute distress.    Appearance: Normal appearance. She is well-developed. She is not diaphoretic.  HENT:     Head: Normocephalic and atraumatic.  Eyes:     General:        Right eye: No discharge.        Left eye: No discharge.  Cardiovascular:     Rate and Rhythm: Normal rate and regular rhythm.     Pulses: Normal pulses.     Heart sounds: Normal heart sounds.  Pulmonary:     Effort: Pulmonary effort is normal. No respiratory distress.     Breath sounds: Normal  breath sounds. No wheezing or rales.     Comments: Respirations equal and unlabored, patient able to speak in full sentences, lungs clear to auscultation bilaterally  Abdominal:     General: Bowel sounds are normal. There is no distension.     Palpations: Abdomen is soft. There is no mass.     Tenderness: There is no abdominal tenderness. There is no guarding.     Comments: Abdomen soft, nondistended, nontender to palpation in all quadrants without guarding or peritoneal signs, no RUQ tenderness, mild right flank tenderness but no frank CVA tenderness  Musculoskeletal:        General: Tenderness present. No deformity.     Cervical back: Neck supple.     Comments: Tenderness over the right flank and right paraspinal muscles in the mid-back, no midline spinal tenderness, no overlying skin changes  Skin:    General: Skin is warm and dry.     Capillary Refill: Capillary refill takes less than 2 seconds.  Neurological:     Mental Status: She is alert and oriented to person, place, and time.     Coordination: Coordination normal.     Comments: Speech is clear, able to follow commands CN III-XII intact Normal strength in upper and lower extremities bilaterally including dorsiflexion and plantar flexion, strong and equal grip strength Sensation normal to light and sharp touch Moves extremities without ataxia, coordination intact  Psychiatric:        Mood and Affect: Mood normal.        Behavior: Behavior normal.    ED Results / Procedures / Treatments   Labs (all labs ordered are listed, but only abnormal results are displayed) Labs Reviewed  COMPREHENSIVE METABOLIC PANEL - Abnormal; Notable for the following components:      Result Value   Glucose, Bld 216 (*)    Creatinine, Ser 1.08 (*)    Albumin 3.4 (*)    AST 131 (*)    ALT 119 (*)    Alkaline Phosphatase 142 (*)    GFR, Estimated 54 (*)    All other components within normal limits  URINALYSIS, ROUTINE W REFLEX MICROSCOPIC -  Abnormal; Notable for the following components:   Glucose, UA 50 (*)    All other components within normal limits  LIPASE, BLOOD  CBC WITH DIFFERENTIAL/PLATELET    EKG None  Radiology CT Renal Stone Study  Result Date: 05/11/2021 CLINICAL DATA:  Acute right flank pain, hematuria. EXAM: CT ABDOMEN AND PELVIS WITHOUT CONTRAST TECHNIQUE: Multidetector CT imaging of the abdomen and pelvis was performed following the standard protocol without IV contrast. RADIATION DOSE REDUCTION: This exam was performed according to the departmental dose-optimization program which includes automated  exposure control, adjustment of the mA and/or kV according to patient size and/or use of iterative reconstruction technique. COMPARISON:  May 01, 2021. FINDINGS: Lower chest: No acute abnormality. Hepatobiliary: Status post cholecystectomy. No biliary dilatation is noted. Nodular hepatic contours are noted suggesting hepatic cirrhosis. Pancreas: Unremarkable. No pancreatic ductal dilatation or surrounding inflammatory changes. Spleen: Normal in size without focal abnormality. Adrenals/Urinary Tract: Adrenal glands are unremarkable. Kidneys are normal, without renal calculi, focal lesion, or hydronephrosis. Bladder is unremarkable. Stomach/Bowel: Stomach is within normal limits. Appendix appears normal. No evidence of bowel wall thickening, distention, or inflammatory changes. Diverticulosis of descending and sigmoid colon is noted without inflammation. Vascular/Lymphatic: Aortic atherosclerosis. No enlarged abdominal or pelvic lymph nodes. Reproductive: Status post hysterectomy. No adnexal masses. Other: No abdominal wall hernia or abnormality. No abdominopelvic ascites. Musculoskeletal: Status post bilateral total hip arthroplasties. No acute osseous abnormality is noted. IMPRESSION: Findings consistent with hepatic cirrhosis. Diverticulosis of descending and sigmoid colon is noted without inflammation. No acute abnormality  is noted in the abdomen or pelvis. Aortic Atherosclerosis (ICD10-I70.0). Electronically Signed   By: Marijo Conception M.D.   On: 05/11/2021 11:55     Procedures Procedures    Medications Ordered in ED Medications  ondansetron (ZOFRAN) injection 4 mg (4 mg Intravenous Given 05/11/21 1112)  morphine (PF) 4 MG/ML injection 4 mg (4 mg Intravenous Given 05/11/21 1112)  sodium chloride 0.9 % bolus 500 mL (0 mLs Intravenous Stopped 05/11/21 1320)    ED Course/ Medical Decision Making/ A&P                           76 y.o. female presents to the ED with complaints of right flank pain, this involves an extensive number of treatment options, and is a complaint that carries with it a high risk of complications and morbidity.  The differential diagnosis includes nephrolithiasis, pyelonephritis, hepatitis, musculoskeletal pain, AAA, dissection, hx of prior cholecystectomy so no concern for gallbladder pathology  On arrival pt is nontoxic, vitals WNL. Exam significant for mild right flank and paraspinal tenderness, no anterior abdominal tenderness  Additional history obtained from medical records. Outside records obtained and reviewed including recent admission, and PCP follow up  I ordered IV morphine and zofran for pain management  Lab Tests:  I Ordered, reviewed, and interpreted labs, which included: No leukocytosis, hyperglycemia, improving transaminitis, normal LFTs, no hematuria or signs of UTI on UA  Imaging Studies ordered:  I ordered imaging studies which included CT renal stone study, I independently visualized and interpreted imaging which showed no renal stones for hydronephrosis, more detailed report from radiologist as above  ED Course:   Patient presents with ongoing right flank and back pain, recently admitted for right upper quadrant pain and had transaminitis which seems to be improving, the rest of patient's lab work today has been reassuring, there was some concern during recent  admission for possible kidney stone, but CT renal stone study shows no evidence of stone or hydro-, and urinalysis does not suggest pyelonephritis.  Patient's pain is reproducible with palpation and seems to be more so located in the paraspinal region and is also reproducible with movement.  Suspect this may be more so due to musculoskeletal back pain.  No associated red flag symptoms.  Pain improved with treatment here in the ED.  Will encourage patient to use Robaxin, Lidoderm and Tylenol at home and follow-up closely with her primary care provider.  Given reassuring work-up today  do not feel that patient will require admission.  Discharged home in good condition.  Return precautions provided.    Portions of this note were generated with [employer]. Dictation errors may occur despite best attempts at proofreading.         Final Clinical Impression(s) / ED Diagnoses Final diagnoses:  Flank pain    Rx / DC Orders ED Discharge Orders          Ordered    methocarbamol (ROBAXIN) 500 MG tablet  2 times daily        05/11/21 1424              Janet Berlin 05/14/21 2359    Teressa Lower, MD 05/16/21 [phone]

## 2021-05-11 NOTE — ED Notes (Signed)
Pt transported to CT ?

## 2021-05-11 NOTE — Discharge Instructions (Signed)
I suspect her flank pain is more musculoskeletal rather than related to a kidney stone, we do not see any signs of stones on your scan today or any signs of infection in your urine, your liver function test seem to be improving please follow-up with your doctor for recheck of these.  In the meantime to help with back pain you can use Tylenol 1000 mg every 6 hours, Salonpas lidocaine patches every 12 hours and Robaxin to help with spasm and pain.  This medication can cause some drowsiness, do not take before driving.  Follow-up with your regular doctor if symptoms or not improving.

## 2021-07-25 ENCOUNTER — Ambulatory Visit: Payer: Medicare HMO | Admitting: Orthopaedic Surgery

## 2021-07-25 ENCOUNTER — Encounter: Payer: Self-pay | Admitting: Orthopaedic Surgery

## 2021-07-25 VITALS — BP 142/88 | HR 68 | Ht 64.0 in | Wt 183.0 lb

## 2021-07-25 DIAGNOSIS — M65342 Trigger finger, left ring finger: Secondary | ICD-10-CM

## 2021-07-25 DIAGNOSIS — G5603 Carpal tunnel syndrome, bilateral upper limbs: Secondary | ICD-10-CM | POA: Diagnosis not present

## 2021-07-25 MED ORDER — PREDNISONE 5 MG (21) PO TBPK: ORAL_TABLET | ORAL | 0 refills | Status: DC

## 2021-07-25 NOTE — Patient Instructions (Signed)
Follow up 1 month Bilat CTS ?

## 2021-07-25 NOTE — Progress Notes (Signed)
My hands go numb. ? ?She has had nocturnal pain and numbness of the median nerve distribution of both hands for many months.  She has seen Dr. Gerilyn Pilgrim and had EMGs. She was told about possible surgery.  She did not bring in the EMG report. ? ?She has some pain of the left ring finger at times but no triggering yet. ? ?She has no trauma. ? ?Nothing has helped her numbness. ? ?Exam shows bilateral Tinel and Phalen signs.  She has decreased sensation in the median nerve distribution.  The left ring finger has pain at the A1 pulley but no triggering.  NV otherwise intact. ? ?Encounter Diagnoses  ?Name Primary?  ? Carpal tunnel syndrome, bilateral Yes  ? Trigger finger, left ring finger   ? ?I got the reports from Dr. Gerilyn Pilgrim.  She has severe carpal tunnel right and moderate left. ? ?I will give prednisone dose pack. ? ?I told her I no longer do surgery.  She will most likely need carpal tunnel surgery. ? ?Return in one month. ? ?Call if any problem. ? ?Precautions discussed. ? ?Electronically Signed ?Darreld Mclean, MD ?5/2/20239:27 AM ? ?

## 2021-09-05 ENCOUNTER — Ambulatory Visit: Payer: Medicare HMO | Admitting: Orthopaedic Surgery

## 2021-09-05 ENCOUNTER — Encounter: Payer: Self-pay | Admitting: Orthopaedic Surgery

## 2021-09-05 VITALS — BP 129/76 | HR 68 | Ht 64.0 in | Wt 183.0 lb

## 2021-09-05 DIAGNOSIS — G5603 Carpal tunnel syndrome, bilateral upper limbs: Secondary | ICD-10-CM

## 2021-09-05 DIAGNOSIS — M65342 Trigger finger, left ring finger: Secondary | ICD-10-CM

## 2021-09-05 NOTE — Progress Notes (Signed)
My right hand is better but my left is tender  Her right carpal tunnel is better.  She does not want any surgery.  The left ring finger is not triggering much.  She does not want surgery.  She uses Voltaren Gel and it helps.  Tinel is negative on the right and Phalen is positive.  She has decreased sensation median nerve distribution right.  Left ring finger has good motion today. NV intact.  Encounter Diagnoses  Name Primary?   Carpal tunnel syndrome, bilateral Yes   Trigger finger, left ring finger    Return in six weeks.  Call if any problem.  Precautions discussed.  Electronically Signed Sanjuana Kava, MD 6/13/20239:58 AM

## 2021-10-17 ENCOUNTER — Ambulatory Visit: Payer: Medicare HMO | Admitting: Orthopaedic Surgery

## 2021-10-23 ENCOUNTER — Other Ambulatory Visit: Payer: Self-pay

## 2021-12-21 ENCOUNTER — Other Ambulatory Visit (HOSPITAL_COMMUNITY): Payer: Self-pay | Admitting: Emergency Medicine

## 2021-12-21 ENCOUNTER — Other Ambulatory Visit: Payer: Self-pay | Admitting: Emergency Medicine

## 2021-12-21 DIAGNOSIS — Z8719 Personal history of other diseases of the digestive system: Secondary | ICD-10-CM

## 2022-01-02 ENCOUNTER — Ambulatory Visit (HOSPITAL_COMMUNITY)
Admission: RE | Admit: 2022-01-02 | Discharge: 2022-01-02 | Disposition: A | Discharge status: Home / Self Care | Payer: Medicare HMO | Source: Ambulatory Visit | Attending: Emergency Medicine | Admitting: Emergency Medicine

## 2022-01-02 ENCOUNTER — Encounter (HOSPITAL_COMMUNITY): Payer: Self-pay

## 2022-01-02 DIAGNOSIS — K7460 Unspecified cirrhosis of liver: Secondary | ICD-10-CM | POA: Insufficient documentation

## 2022-01-02 DIAGNOSIS — Z8719 Personal history of other diseases of the digestive system: Secondary | ICD-10-CM | POA: Diagnosis present

## 2022-01-02 DIAGNOSIS — K5730 Diverticulosis of large intestine without perforation or abscess without bleeding: Secondary | ICD-10-CM | POA: Diagnosis not present

## 2022-01-02 DIAGNOSIS — K760 Fatty (change of) liver, not elsewhere classified: Secondary | ICD-10-CM | POA: Diagnosis not present

## 2022-01-02 MED ORDER — IOHEXOL 300 MG/ML  SOLN
100.0000 mL | Freq: Once | INTRAMUSCULAR | Status: AC | PRN
  Administered 2022-01-02: 100 mL via INTRAVENOUS

## 2022-01-02 MED ORDER — SODIUM CHLORIDE (PF) 0.9 % IJ SOLN
INTRAMUSCULAR | Status: AC
  Filled 2022-01-02: qty 50

## 2022-02-13 ENCOUNTER — Encounter: Payer: Self-pay | Admitting: Cardiology

## 2022-02-13 ENCOUNTER — Ambulatory Visit: Payer: Medicare HMO | Attending: Cardiology | Admitting: Cardiology

## 2022-02-13 VITALS — BP 165/90 | HR 66 | Ht 64.0 in | Wt 183.0 lb

## 2022-02-13 DIAGNOSIS — R011 Cardiac murmur, unspecified: Secondary | ICD-10-CM | POA: Diagnosis not present

## 2022-02-13 DIAGNOSIS — I6529 Occlusion and stenosis of unspecified carotid artery: Secondary | ICD-10-CM

## 2022-02-13 NOTE — Progress Notes (Signed)
Clinical Summary Ms. Anselm Lisnoch is a 76 y.o.female seen today for follow up of the following medical problems.    1. 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    - no SOB/DOE   2. Hyperlipidemia - compliant with meds 04/2021 TC 107 TG 69 HDL 29 LDL 64 -she reports she stopped taking statin on her own last week to see if was affecting her bp   3. Carotid stenosis - 01/2019 mild disease bilaterally   4. TIA - left sided numbness back in March, head to toe. Symptoms lasted about 2 hours.  - she was seen by pcp few months later - intermittent episodes since that time, last episode 3 months ago.    5. HTN   - home bp's daily, sbp's 200 - she reports some recent kidney issues, pcp started losartan 50mg  daily 2 months - she reports pcp stopped norvasc, she reports was causing frequent urination and was affecting her kidneys. Reports pcp changed her to losartan.    6. CKD      SH: [place] [employer] [place]   Past Medical History:  Diagnosis Date   Arthritis    Chronic back pain    Chronic pelvic pain in female    pelvic joint and thigh   Chronic right hip pain    Chronic shoulder pain    Frequency of urination    Heart murmur    Hypertension    Lumbago    Neuralgia and neuritis    Osteoarthritis    Pneumonia    hx of   PONV (postoperative nausea and vomiting)    Radiculitis    Sciatica    Seizures (HCC)    had seizures as a child     Allergies  Allergen Reactions   Clarithromycin Shortness Of Breath   Codeine Shortness Of Breath   Hydrocodone Shortness Of Breath   Penicillins Shortness Of Breath and Other (See Comments)     PATIENT HAS HAD A PCN REACTION WITH IMMEDIATE RASH, FACIAL/TONGUE/THROAT SWELLING, SOB, OR LIGHTHEADEDNESS WITH HYPOTENSION:  #  #  #  YES  #  #  #   Has patient had a PCN reaction causing severe rash  involving mucus membranes or skin necrosis: No Has patient had a PCN reaction that required hospitalization: No Has patient had a PCN reaction occurring within the last 10 years: No    Sulfa Antibiotics Shortness Of Breath   Dilaudid [Hydromorphone Hcl] Other (See Comments)    ORAL SORES   Oxycodone Hcl Nausea Only     Current Outpatient Medications  Medication Sig Dispense Refill   amLODipine (NORVASC) 10 MG tablet Take 1 tablet (10 mg total) by mouth daily. 90 tablet 3   aspirin EC 81 MG tablet Take 1 tablet (81 mg total) by mouth daily with breakfast. 90 tablet 3   diclofenac Sodium (VOLTAREN) 1 % GEL Apply 2 g topically 4 (four) times daily. 50 g 0   febuxostat (ULORIC) 40 MG tablet One daily for gout 30 tablet 5   methocarbamol (ROBAXIN) 500 MG tablet Take 1 tablet (500 mg total) by mouth 2 (two) times daily. 20 tablet 0   polyethylene glycol (MIRALAX / GLYCOLAX) 17 g packet Take 17 g by mouth daily. For constipation 30 each 0   sitaGLIPtin (JANUVIA) 100 MG tablet Take 1 tablet (100 mg total) by mouth  daily. 30 tablet 3   tamsulosin (FLOMAX) 0.4 MG CAPS capsule Take 0.4 mg by mouth daily.     No current facility-administered medications for this visit.     Past Surgical History:  Procedure Laterality Date   ABDOMINAL HYSTERECTOMY     partial   BACK SURGERY     CHOLECYSTECTOMY     COLONOSCOPY W/ POLYPECTOMY     JOINT REPLACEMENT     TOTAL HIP ARTHROPLASTY Right 10/26/2014   Procedure: TOTAL HIP ARTHROPLASTY;  Surgeon: Valeria Batman, MD;  Location: [facility] OR;  Service: Orthopedics;  Laterality: Right;   TOTAL HIP ARTHROPLASTY Left 10/09/2016   TOTAL HIP ARTHROPLASTY Left 10/09/2016   Procedure: LEFT TOTAL HIP ARTHROPLASTY;  Surgeon: Valeria Batman, MD;  Location: [facility] OR;  Service: Orthopedics;  Laterality: Left;     Allergies  Allergen Reactions   Clarithromycin Shortness Of Breath   Codeine Shortness Of Breath   Hydrocodone Shortness Of Breath   Penicillins  Shortness Of Breath and Other (See Comments)     PATIENT HAS HAD A PCN REACTION WITH IMMEDIATE RASH, FACIAL/TONGUE/THROAT SWELLING, SOB, OR LIGHTHEADEDNESS WITH HYPOTENSION:  #  #  #  YES  #  #  #   Has patient had a PCN reaction causing severe rash involving mucus membranes or skin necrosis: No Has patient had a PCN reaction that required hospitalization: No Has patient had a PCN reaction occurring within the last 10 years: No    Sulfa Antibiotics Shortness Of Breath   Dilaudid [Hydromorphone Hcl] Other (See Comments)    ORAL SORES   Oxycodone Hcl Nausea Only      Family History  Problem Relation Age of Onset   Hypertension Mother    Depression Mother    Hypertension Father    Cancer Father      Social History Ms. Splinter reports that she has been smoking cigarettes. She started smoking about 56 years ago. She has a 14.00 pack-year smoking history. She has never used smokeless tobacco. Ms. Colgate reports no history of alcohol use.   Review of Systems CONSTITUTIONAL: No weight loss, fever, chills, weakness or fatigue.  HEENT: Eyes: No visual loss, blurred vision, double vision or yellow sclerae.No hearing loss, sneezing, congestion, runny nose or sore throat.  SKIN: No rash or itching.  CARDIOVASCULAR: per hpi RESPIRATORY: No shortness of breath, cough or sputum.  GASTROINTESTINAL: No anorexia, nausea, vomiting or diarrhea. No abdominal pain or blood.  GENITOURINARY: No burning on urination, no polyuria NEUROLOGICAL: No headache, dizziness, syncope, paralysis, ataxia, numbness or tingling in the extremities. No change in bowel or bladder control.  MUSCULOSKELETAL: No muscle, back pain, joint pain or stiffness.  LYMPHATICS: No enlarged nodes. No history of splenectomy.  PSYCHIATRIC: No history of depression or anxiety.  ENDOCRINOLOGIC: No reports of sweating, cold or heat intolerance. No polyuria or polydipsia.  Marland Kitchen   Physical Examination Today's Vitals   02/13/22 1551  02/13/22 1613  BP: (!) 158/110 (!) 165/90  Pulse: 66   Weight: 183 lb (83 kg)   Height: 5\' 4"  (1.626 m)    Body mass index is 31.41 kg/m.  Gen: resting comfortably, no acute distress HEENT: no scleral icterus, pupils equal round and reactive, no palptable cervical adenopathy,  CV: RRR, 3/6 systolic murmur rusb, no jvd. +bilateral carotid bruits Resp: Clear to auscultation bilaterally GI: abdomen is soft, non-tender, non-distended, normal bowel sounds, no hepatosplenomegaly MSK: extremities are warm, no edema.  Skin: warm, no rash Neuro:  no focal  deficits Psych: appropriate affect   Diagnostic Studies  05/2015 echo Study Conclusions   - Left ventricle: The cavity size was normal. Wall thickness was   increased in a pattern of moderate LVH. Systolic function was   normal. The estimated ejection fraction was in the range of 60%   to 65%. Wall motion was normal; there were no regional wall   motion abnormalities. Doppler parameters are consistent with   abnormal left ventricular relaxation (grade 1 diastolic   dysfunction). - Aortic valve: Mildly thickened, moderately calcified leaflets.   Aortic valve sclerosis noted. Morphologically, there appears to   be at least mild stenosis. No hemodynamic parameters obtained. - Mitral valve: Mildly calcified annulus.     01/2019 echo IMPRESSIONS     1. Left ventricular ejection fraction, by visual estimation, is 60 to  65%. The left ventricle has normal function. There is mildly increased  left ventricular hypertrophy.   2. Left ventricular diastolic parameters are consistent with Grade I  diastolic dysfunction (impaired relaxation).   3. Global right ventricle has normal systolic function.The right  ventricular size is normal. No increase in right ventricular wall  thickness.   4. Left atrial size was normal.   5. Right atrial size was normal.   6. Mild aortic valve annular calcification.   7. Mild mitral annular  calcification.   8. The mitral valve is grossly normal. Trace mitral valve regurgitation.   9. The tricuspid valve is grossly normal. Tricuspid valve regurgitation  is trivial.  10. The aortic valve is tricuspid. Aortic valve regurgitation is not  visualized. Mild to moderate aortic valve sclerosis/calcification without  any evidence of aortic stenosis. Limited excursion of noncoronary cusp  with upper normal transvalvular  gradients.  11. The pulmonic valve was grossly normal. Pulmonic valve regurgitation is  not visualized.  12. The inferior vena cava is normal in size with greater than 50%  respiratory variability, suggesting right atrial pressure of 3 mmHg.      01/2019 carotid US Right:   Color duplex indicates moderate heterogeneous and calcified plaque, with no hemodynamically significant stenosis by duplex criteria in the extracranial cerebrovascular circulation.   Left:   Heterogeneous and calcified plaque at the left carotid bifurcation with less than 50% stenosis by established duplex criteria. Note that the flow velocities of the left ICA were obtained from an area distal to the maximum narrowing due to the presence of anterior wall plaque with shadowing and may be underestimating the percentage of ICA stenosis. If establishing a more accurate degree of stenosis is required, cerebral angiogram should be considered, or as a second best test, CTA.       Assessment and Plan   1. Aortic sclerosis/heart murmur - heart murmur seems more prominent on exam, will recheck echo     2. Hyperlipidemia - at goal, advised to restart her statin as that would not be causing any elevations in her bp - she is on atorvastastin 40mg  daily.   3. HTN - I have not seen her in 2 years. Appears off norvasc and now on losartan 50mg  daily. I am unclear what led to coming off norvasc, the symptosm she reported of frequent urination and changes in kidney function would not be from  norvasc as she reported, I wonder if she may be confusing it with another medication - obtain clinic notes and labs from pcp. Would either restart norvasc or titrate up losartan pending on the info we get, we will have a virtual  visit 3 weeks to reassess and make plan for bp       Antoine Poche, M.D.

## 2022-02-13 NOTE — Patient Instructions (Signed)
Medication Instructions:  Your physician recommends that you continue on your current medications as directed. Please refer to the Current Medication list given to you today.   Labwork: None  Testing/Procedures: Your physician has requested that you have an echocardiogram. Echocardiography is a painless test that uses sound waves to create images of your heart. It provides your doctor with information about the size and shape of your heart and how well your heart's chambers and valves are working. This procedure takes approximately one hour. There are no restrictions for this procedure. Please do NOT wear cologne, perfume, aftershave, or lotions (deodorant is allowed). Please arrive 15 minutes prior to your appointment time.  Your physician has requested that you have a carotid duplex. This test is an ultrasound of the carotid arteries in your neck. It looks at blood flow through these arteries that supply the brain with blood. Allow one hour for this exam. There are no restrictions or special instructions.   Follow-Up: Follow up with Dr. Wyline Mood in 3 weeks with a Virtual Visit  Any Other Special Instructions Will Be Listed Below (If Applicable).     If you need a refill on your cardiac medications before your next appointment, please call your pharmacy.

## 2022-02-28 ENCOUNTER — Ambulatory Visit (HOSPITAL_BASED_OUTPATIENT_CLINIC_OR_DEPARTMENT_OTHER)
Admission: RE | Admit: 2022-02-28 | Discharge: 2022-02-28 | Disposition: A | Discharge status: Home / Self Care | Payer: Medicare HMO | Source: Ambulatory Visit | Attending: Cardiology | Admitting: Cardiology

## 2022-02-28 ENCOUNTER — Ambulatory Visit (HOSPITAL_COMMUNITY)
Admission: RE | Admit: 2022-02-28 | Discharge: 2022-02-28 | Disposition: A | Discharge status: Home / Self Care | Payer: Medicare HMO | Source: Ambulatory Visit | Attending: Cardiology | Admitting: Cardiology

## 2022-02-28 DIAGNOSIS — R011 Cardiac murmur, unspecified: Secondary | ICD-10-CM

## 2022-02-28 DIAGNOSIS — I119 Hypertensive heart disease without heart failure: Secondary | ICD-10-CM | POA: Insufficient documentation

## 2022-02-28 DIAGNOSIS — I6529 Occlusion and stenosis of unspecified carotid artery: Secondary | ICD-10-CM | POA: Insufficient documentation

## 2022-02-28 DIAGNOSIS — I3481 Nonrheumatic mitral (valve) annulus calcification: Secondary | ICD-10-CM | POA: Diagnosis not present

## 2022-02-28 DIAGNOSIS — E119 Type 2 diabetes mellitus without complications: Secondary | ICD-10-CM | POA: Diagnosis not present

## 2022-02-28 LAB — ECHOCARDIOGRAM COMPLETE
AR max vel: 1.57 cm2
AV Area VTI: 1.48 cm2
AV Area mean vel: 1.59 cm2
AV Mean grad: 7 mmHg
AV Peak grad: 13.1 mmHg
Ao pk vel: 1.81 m/s
Area-P 1/2: 2.69 cm2
S' Lateral: 2.4 cm

## 2022-02-28 NOTE — Progress Notes (Signed)
*  PRELIMINARY RESULTS* Echocardiogram 2D Echocardiogram has been performed.  Stacey Drain 02/28/2022, 12:41 PM

## 2022-03-07 ENCOUNTER — Emergency Department (HOSPITAL_COMMUNITY): Payer: Medicare HMO

## 2022-03-07 ENCOUNTER — Encounter (HOSPITAL_COMMUNITY): Payer: Self-pay

## 2022-03-07 ENCOUNTER — Emergency Department (HOSPITAL_COMMUNITY)
Admission: EM | Admit: 2022-03-07 | Discharge: 2022-03-07 | Disposition: A | Discharge status: Home / Self Care | Payer: Medicare HMO | Attending: Emergency Medicine | Admitting: Emergency Medicine

## 2022-03-07 ENCOUNTER — Other Ambulatory Visit: Payer: Self-pay

## 2022-03-07 DIAGNOSIS — I10 Essential (primary) hypertension: Secondary | ICD-10-CM | POA: Diagnosis not present

## 2022-03-07 DIAGNOSIS — I739 Peripheral vascular disease, unspecified: Secondary | ICD-10-CM | POA: Insufficient documentation

## 2022-03-07 DIAGNOSIS — Z7984 Long term (current) use of oral hypoglycemic drugs: Secondary | ICD-10-CM | POA: Insufficient documentation

## 2022-03-07 DIAGNOSIS — E119 Type 2 diabetes mellitus without complications: Secondary | ICD-10-CM | POA: Diagnosis not present

## 2022-03-07 DIAGNOSIS — I998 Other disorder of circulatory system: Secondary | ICD-10-CM | POA: Insufficient documentation

## 2022-03-07 DIAGNOSIS — Z7982 Long term (current) use of aspirin: Secondary | ICD-10-CM | POA: Insufficient documentation

## 2022-03-07 DIAGNOSIS — Z79899 Other long term (current) drug therapy: Secondary | ICD-10-CM | POA: Diagnosis not present

## 2022-03-07 LAB — COMPREHENSIVE METABOLIC PANEL
ALT: 24 U/L (ref 0–44)
AST: 23 U/L (ref 15–41)
Albumin: 3.8 g/dL (ref 3.5–5.0)
Alkaline Phosphatase: 116 U/L (ref 38–126)
Anion gap: 9 (ref 5–15)
BUN: 13 mg/dL (ref 8–23)
CO2: 27 mmol/L (ref 22–32)
Calcium: 9.5 mg/dL (ref 8.9–10.3)
Chloride: 103 mmol/L (ref 98–111)
Creatinine, Ser: 1.13 mg/dL — ABNORMAL HIGH (ref 0.44–1.00)
GFR, Estimated: 50 mL/min — ABNORMAL LOW (ref >60)
Glucose, Bld: 170 mg/dL — ABNORMAL HIGH (ref 70–99)
Potassium: 4.2 mmol/L (ref 3.5–5.1)
Sodium: 139 mmol/L (ref 135–145)
Total Bilirubin: 0.5 mg/dL (ref 0.3–1.2)
Total Protein: 7.5 g/dL (ref 6.5–8.1)

## 2022-03-07 LAB — CBC WITH DIFFERENTIAL/PLATELET
Abs Immature Granulocytes: 0.01 10*3/uL (ref 0.00–0.07)
Basophils Absolute: 0 10*3/uL (ref 0.0–0.1)
Basophils Relative: 1 %
Eosinophils Absolute: 0.2 10*3/uL (ref 0.0–0.5)
Eosinophils Relative: 3 %
HCT: 41.8 % (ref 36.0–46.0)
Hemoglobin: 13.3 g/dL (ref 12.0–15.0)
Immature Granulocytes: 0 %
Lymphocytes Relative: 32 %
Lymphs Abs: 2.1 10*3/uL (ref 0.7–4.0)
MCH: 28.4 pg (ref 26.0–34.0)
MCHC: 31.8 g/dL (ref 30.0–36.0)
MCV: 89.1 fL (ref 80.0–100.0)
Monocytes Absolute: 0.3 10*3/uL (ref 0.1–1.0)
Monocytes Relative: 5 %
Neutro Abs: 4 10*3/uL (ref 1.7–7.7)
Neutrophils Relative %: 59 %
Platelets: 201 10*3/uL (ref 150–400)
RBC: 4.69 MIL/uL (ref 3.87–5.11)
RDW: 13.2 % (ref 11.5–15.5)
WBC: 6.7 10*3/uL (ref 4.0–10.5)
nRBC: 0 % (ref 0.0–0.2)

## 2022-03-07 LAB — URIC ACID: Uric Acid, Serum: 5.7 mg/dL (ref 2.5–7.1)

## 2022-03-07 MED ORDER — ONDANSETRON 4 MG PO TBDP: ORAL_TABLET | ORAL | 0 refills | Status: DC

## 2022-03-07 MED ORDER — OXYCODONE-ACETAMINOPHEN 5-325 MG PO TABS: ORAL_TABLET | ORAL | 0 refills | Status: DC

## 2022-03-07 MED ORDER — IOHEXOL 350 MG/ML SOLN
100.0000 mL | Freq: Once | INTRAVENOUS | Status: AC | PRN
  Administered 2022-03-07: 100 mL via INTRAVENOUS

## 2022-03-07 NOTE — Discharge Instructions (Signed)
Take 1 baby aspirin a day.  Follow-up with Dr. Sherral Hammers or one of his associates in the next couple weeks.  If your right foot becomes severely painful or blue again,  you should go to [facility].  Make sure you take your blood pressure medicine and follow-up with your family doctor for your blood pressure

## 2022-03-07 NOTE — ED Triage Notes (Signed)
Pt sent by PCP, states right foot is hurting and pt states was blue yesterday. Pt also states her BP 200/100. Pt states "I just couldn't make it yesterday"

## 2022-03-07 NOTE — ED Provider Notes (Signed)
[facility] EMERGENCY DEPARTMENT Provider Note   CSN: [id number] Arrival date & time: 03/07/22  0759     History  Chief Complaint  Patient presents with   Circulatory Problem    Jessica Hoover is a 76 y.o. female.  Patient has a history of hypertension and diabetes.  Right foot wound on the bottom and painful yesterday.  Now she has some pain in the MCPs and  The history is provided by the patient and medical records. No language interpreter was used.  Foot Pain This is a new problem. The problem occurs constantly. The problem has not changed since onset.Pertinent negatives include no chest pain, no abdominal pain and no headaches. Nothing relieves the symptoms. She has tried nothing for the symptoms.       Home Medications Prior to Admission medications   Medication Sig Start Date End Date Taking? Authorizing Provider  aspirin EC 81 MG tablet Take 1 tablet (81 mg total) by mouth daily with breakfast. 05/03/21  Yes Emokpae, Courage, MD  atorvastatin (LIPITOR) 40 MG tablet Take 40 mg by mouth daily. 11/02/21  Yes [provider]  glipiZIDE (GLUCOTROL) 5 MG tablet Take 5 mg by mouth daily before breakfast.   Yes [provider]  losartan (COZAAR) 50 MG tablet Take 50 mg by mouth 2 (two) times daily.   Yes [provider]  ondansetron (ZOFRAN-ODT) 4 MG disintegrating tablet 4mg  ODT q4 hours prn nausea/vomit 03/07/22  Yes Bethann BerkshireZammit, Ladarious Kresse, MD  oxyCODONE-acetaminophen (PERCOCET/ROXICET) 5-325 MG tablet Take 1 every 6 hours as needed for pain not relieved by Tylenol 1 03/07/22  Yes Bethann BerkshireZammit, Younique Casad, MD  sitaGLIPtin (JANUVIA) 100 MG tablet Take 1 tablet (100 mg total) by mouth daily. 05/03/21  Yes Emokpae, Courage, MD  amLODipine (NORVASC) 10 MG tablet Take 1 tablet (10 mg total) by mouth daily. Patient not taking: Reported on 03/07/2022 05/03/21   Shon HaleEmokpae, Courage, MD  diclofenac Sodium (VOLTAREN) 1 % GEL Apply 2 g topically 4 (four) times daily. Patient not taking:  Reported on 03/07/2022 06/15/19   Maia PlanLong, Joshua G, MD  febuxostat Thyra Breed(ULORIC) 40 MG tablet One daily for gout Patient not taking: Reported on 03/07/2022 09/01/19   Darreld McleanKeeling, Wayne, MD  methocarbamol (ROBAXIN) 500 MG tablet Take 1 tablet (500 mg total) by mouth 2 (two) times daily. Patient not taking: Reported on 03/07/2022 05/11/21   Dartha LodgeFord, Kelsey N, PA-C  polyethylene glycol (MIRALAX / GLYCOLAX) 17 g packet Take 17 g by mouth daily. For constipation Patient not taking: Reported on 03/07/2022 05/03/21   Shon HaleEmokpae, Courage, MD      Allergies    Clarithromycin, Codeine, Hydrocodone, Penicillins, Sulfa antibiotics, Dilaudid [hydromorphone hcl], and Oxycodone hcl    Review of Systems   Review of Systems  Constitutional:  Negative for appetite change and fatigue.  HENT:  Negative for congestion, ear discharge and sinus pressure.   Eyes:  Negative for discharge.  Respiratory:  Negative for cough.   Cardiovascular:  Negative for chest pain.  Gastrointestinal:  Negative for abdominal pain and diarrhea.  Genitourinary:  Negative for frequency and hematuria.  Musculoskeletal:  Negative for back pain.       Right foot pain  Skin:  Negative for rash.  Neurological:  Negative for seizures and headaches.  Psychiatric/Behavioral:  Negative for hallucinations.     Physical Exam Updated Vital Signs BP (!) 206/104   Pulse 60   Temp 98 F (36.7 C) (Oral)   Resp 18   Ht 5\' 4"  (1.626 m)  Wt 82.6 kg   SpO2 96%   BMI 31.24 kg/m  Physical Exam Vitals and nursing note reviewed.  Constitutional:      Appearance: She is well-developed.  HENT:     Head: Normocephalic.     Mouth/Throat:     Mouth: Mucous membranes are moist.  Eyes:     General: No scleral icterus.    Conjunctiva/sclera: Conjunctivae normal.  Neck:     Thyroid: No thyromegaly.  Cardiovascular:     Rate and Rhythm: Normal rate and regular rhythm.     Heart sounds: No murmur heard.    No friction rub. No gallop.  Pulmonary:     Breath  sounds: No stridor. No wheezing or rales.  Chest:     Chest wall: No tenderness.  Abdominal:     General: There is no distension.     Tenderness: There is no abdominal tenderness. There is no rebound.  Musculoskeletal:        General: Normal range of motion.     Cervical back: Neck supple.     Comments: 1+ dorsalis pedis right foot with tenderness to MCP joint mild swelling.  Warm foot  Lymphadenopathy:     Cervical: No cervical adenopathy.  Skin:    Findings: No erythema or rash.  Neurological:     Mental Status: She is alert and oriented to person, place, and time.     Motor: No abnormal muscle tone.     Coordination: Coordination normal.  Psychiatric:        Behavior: Behavior normal.     ED Results / Procedures / Treatments   Labs (all labs ordered are listed, but only abnormal results are displayed) Labs Reviewed  COMPREHENSIVE METABOLIC PANEL - Abnormal; Notable for the following components:      Result Value   Glucose, Bld 170 (*)    Creatinine, Ser 1.13 (*)    GFR, Estimated 50 (*)    All other components within normal limits  CBC WITH DIFFERENTIAL/PLATELET  URIC ACID    EKG None  Radiology DG Foot Complete Right  Result Date: 03/07/2022 CLINICAL DATA:  pain EXAM: RIGHT FOOT COMPLETE - 3+ VIEW COMPARISON:  09/12/2019 FINDINGS: There is no evidence of fracture or dislocation. Stable early degenerative change in the first MTP joint manifest by some subchondral sclerosis and cystic change, without hallux valgus deformity. There is no other evidence of arthropathy or other focal bone abnormality. Soft tissues are unremarkable. IMPRESSION: No acute findings. Early first MTP DJD. Electronically Signed   By: Corlis Leak M.D.   On: 03/07/2022 13:41   CT ANGIO LOWER EXT BILAT W &/OR WO CONTRAST  Result Date: 03/07/2022 CLINICAL DATA:  76 year old female with right foot pain and discoloration. EXAM: CT ANGIOGRAPHY OF ABDOMINAL AORTA WITH ILIOFEMORAL RUNOFF TECHNIQUE:  Multidetector CT imaging of the abdomen, pelvis and lower extremities was performed using the standard protocol during bolus administration of intravenous contrast. Multiplanar CT image reconstructions and MIPs were obtained to evaluate the vascular anatomy. RADIATION DOSE REDUCTION: This exam was performed according to the departmental dose-optimization program which includes automated exposure control, adjustment of the mA and/or kV according to patient size and/or use of iterative reconstruction technique. CONTRAST:  OMNIPAQUE IOHEXOL 350 MG/ML SOLN COMPARISON:  CT abdomen pelvis from 01/02/2022 FINDINGS: VASCULAR Aorta: Visualized distal aorta is patent and caliber with near circumferential atherosclerotic calcifications RIGHT Lower Extremity Inflow: Common, internal and external iliac arteries are patent without evidence of aneurysm, dissection, vasculitis or significant  stenosis. Scattered atherosclerotic calcifications. Outflow: The common femoral artery is patent. The fundus patent. Scattered fibrofatty and calcific atherosclerotic changes throughout the superficial femoral artery with a least mild multifocal stenoses. There is a severe focal stenosis secondary to atherosclerotic plaque level of Hunter's canal. The popliteal artery is patent. Runoff: The anterior tibial artery is patent proximally with proximal long segment occlusion until distal reconstitution at the level of the ankle into the dorsalis pedis artery. Focal occlusion of the proximal tibioperoneal trunk. The peroneal artery is patent to the level of the foot. There is faint opacification of the proximal posterior tibial artery which is otherwise occluded. LEFT Lower Extremity Inflow: Common, internal and external iliac arteries are patent without evidence of aneurysm, dissection, vasculitis or significant stenosis. Scattered atherosclerotic calcifications. Outflow: The common femoral artery is patent. The profunda is patent. Scattered  fibrofatty and calcific atherosclerotic changes throughout the proximal left superficial femoral artery with an approximately 6.4 cm occlusion in its mid to distal portion. There is distal reconstitution of the level of Hunter's canal. The popliteal artery is patent with scattered atherosclerotic calcifications. Runoff: Approximally 1 cm patent anterior tibial artery prior to total occlusion. The peroneal artery is patent to the level of the ankle. The posterior tibial artery is occluded throughout. Veins: No obvious venous abnormality within the limitations of this arterial phase study. Review of the MIP images confirms the above findings. NON-VASCULAR Scattered colonic diverticula without surrounding inflammatory changes. Limited visualization of prostate gland due to streak artifact. The visualized kidneys are within normal limits. The bladder is distended, also partially obstructive by streak artifact. No ascites. Tiny fat containing umbilical hernia. No evidence of pelvic lymphadenopathy. No acute osseous abnormality. Status post bilateral total hip arthroplasty without complicating features. IMPRESSION: VASCULAR 1. Right lower extremity: Patent inflow. Severe focal stenosis superficial artery at the level of Hunter's canal secondary to calcified atherosclerotic plaque. Single-vessel runoff via the peroneal artery. 2. Left lower extremity: Patent inflow. Long segment occlusion of the mid to distal superficial femoral artery with reconstitution at the level of Hunter's canal with associated atherosclerotic calcifications. Single-vessel runoff via the peroneal artery. 3.  Aortic Atherosclerosis (ICD10-I70.0). NON-VASCULAR 1. No acute intra pelvic abnormality. 2. Diverticulosis. Marliss Coots, MD [facility] Radiology Electronically Signed   By: Marliss Coots M.D.   On: 03/07/2022 12:24    Procedures Procedures    Medications Ordered in ED Medications   iohexol (OMNIPAQUE) 350 MG/ML injection 100 mL (100 mLs Intravenous Contrast Given 03/07/22 1132)    ED Course/ Medical Decision Making/ A&P  I spoke with vascular surgery Dr. Sherral Hammers and he stated to start the patient on aspirin and 40 mg of Lipitor, which she is already taking.  She will follow-up in the office.  If she has any acute changes she is to go to [facility]                         Medical Decision Making Amount and/or Complexity of Data Reviewed Labs: ordered. Radiology: ordered.  Risk Prescription drug management.  This patient presents to the ED for concern of foot pain, this involves an extensive number of treatment options, and is a complaint that carries with it a high risk of complications and morbidity.  The differential diagnosis includes peripheral vascular disease, gout, osteoarthritis   Co morbidities that complicate the patient evaluation  Hypertension   Additional history obtained:  Additional history obtained from patient External  records from outside source obtained and reviewed including hospital records   Lab Tests:  I Ordered, and personally interpreted labs.  The pertinent results include: BC unremarkable, glucose 170   Imaging Studies ordered:  I ordered imaging studies including CT angio lower extremities I independently visualized and interpreted imaging which showed referral vascular disease I agree with the radiologist interpretation   Cardiac Monitoring: / EKG:  The patient was maintained on a cardiac monitor.  I personally viewed and interpreted the cardiac monitored which showed an underlying rhythm of: Normal sinus rhythm   Consultations Obtained:  I requested consultation with the vascular surgery,  and discussed lab and imaging findings as well as pertinent plan - they recommend: Start aspirin and follow-up   Problem List / ED Course / Critical interventions / Medication management  Peripheral vascular disease  and hypertension I ordered medication including aspirin for the peripheral vascular disease Reevaluation of the patient after these medicines showed that the patient stayed the same I have reviewed the patients home medicines and have made adjustments as needed   Social Determinants of Health:  None   Test / Admission - Considered:  Admission not needed now  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  She will start an aspirin a day and follow-up with vascular surgery        Final Clinical Impression(s) / ED Diagnoses Final diagnoses:  PVD (peripheral vascular disease) (HCC)    Rx / DC Orders ED Discharge Orders          Ordered    oxyCODONE-acetaminophen (PERCOCET/ROXICET) 5-325 MG tablet        03/07/22 1428    ondansetron (ZOFRAN-ODT) 4 MG disintegrating tablet        03/07/22 1428              Bethann Berkshire, MD 03/07/22 1723

## 2022-03-08 ENCOUNTER — Telehealth: Payer: Self-pay

## 2022-03-08 NOTE — Telephone Encounter (Signed)
-----   Message from Antoine Poche, MD sent at 03/07/2022  1:19 PM EST ----- Mild blcokages in the arteries of the neck, just something to monitor at this time  Dominga Ferry MD

## 2022-03-08 NOTE — Telephone Encounter (Signed)
Patient notified and verbalized understanding. Patient had no questions or concerns at this time. PCP copied 

## 2022-03-09 ENCOUNTER — Other Ambulatory Visit: Payer: Self-pay | Admitting: *Deleted

## 2022-03-09 DIAGNOSIS — M79671 Pain in right foot: Secondary | ICD-10-CM

## 2022-03-13 ENCOUNTER — Encounter: Payer: Self-pay | Admitting: Vascular Surgery

## 2022-03-13 ENCOUNTER — Ambulatory Visit: Payer: Medicare HMO | Admitting: Vascular Surgery

## 2022-03-13 ENCOUNTER — Ambulatory Visit (HOSPITAL_COMMUNITY)
Admission: RE | Admit: 2022-03-13 | Discharge: 2022-03-13 | Disposition: A | Discharge status: Home / Self Care | Payer: Medicare HMO | Source: Ambulatory Visit | Attending: Vascular Surgery | Admitting: Vascular Surgery

## 2022-03-13 VITALS — BP 173/91 | HR 63 | Temp 97.1°F | Resp 14 | Ht 64.0 in | Wt 180.0 lb

## 2022-03-13 DIAGNOSIS — M79671 Pain in right foot: Secondary | ICD-10-CM | POA: Diagnosis present

## 2022-03-13 DIAGNOSIS — I739 Peripheral vascular disease, unspecified: Secondary | ICD-10-CM | POA: Diagnosis not present

## 2022-03-13 NOTE — Progress Notes (Signed)
Patient name: Jessica Hoover MRN: [id number] DOB: 26-Dec-1945 Sex: female  REASON FOR CONSULT: PAD  HPI: Jessica Hoover is a 76 y.o. female, with hx of HTN that presents for evaluation of PAD.  Patient acutely had pain in her right first MTP joint last week.  She was prescribed Uloric for gout.  She says this feels very similar to previous gout episodes.  She was also referred to our office for evaluation of PAD.  She works at [employer] and is on her feet for long periods of time.  She has no pain in her legs when walking or standing.  No pain in the right great toe and all the pain is in the joint.  No open wounds at this time.  Past Medical History:  Diagnosis Date   Arthritis    Chronic back pain    Chronic pelvic pain in female    pelvic joint and thigh   Chronic right hip pain    Chronic shoulder pain    Frequency of urination    Heart murmur    Hypertension    Lumbago    Neuralgia and neuritis    Osteoarthritis    Pneumonia    hx of   PONV (postoperative nausea and vomiting)    Radiculitis    Sciatica    Seizures (HCC)    had seizures as a child    Past Surgical History:  Procedure Laterality Date   ABDOMINAL HYSTERECTOMY     partial   BACK SURGERY     CHOLECYSTECTOMY     COLONOSCOPY W/ POLYPECTOMY     JOINT REPLACEMENT     TOTAL HIP ARTHROPLASTY Right 10/26/2014   Procedure: TOTAL HIP ARTHROPLASTY;  Surgeon: Valeria Batman, MD;  Location: [facility] OR;  Service: Orthopedics;  Laterality: Right;   TOTAL HIP ARTHROPLASTY Left 10/09/2016   TOTAL HIP ARTHROPLASTY Left 10/09/2016   Procedure: LEFT TOTAL HIP ARTHROPLASTY;  Surgeon: Valeria Batman, MD;  Location: [facility] OR;  Service: Orthopedics;  Laterality: Left;    Family History  Problem Relation Age of Onset   Hypertension Mother    Depression Mother    Hypertension Father    Cancer Father     SOCIAL HISTORY: Social History   Socioeconomic History   Marital status: Widowed    Spouse name: Not on file   Number of  children: Not on file   Years of education: Not on file   Highest education level: Not on file  Occupational History   Not on file  Tobacco Use   Smoking status: Some Days    Packs/day: 0.25    Years: 56.00    Total pack years: 14.00    Types: Cigarettes    Start date: 07/11/1965   Smokeless tobacco: Never   Tobacco comments:    3 per week  Vaping Use   Vaping Use: Never used  Substance and Sexual Activity   Alcohol use: No    Alcohol/week: 0.0 standard drinks of alcohol   Drug use: No   Sexual activity: Not Currently  Other Topics Concern   Not on file  Social History Narrative   Not on file   Social Determinants of Health   Financial Resource Strain: Not on file  Food Insecurity: Not on file  Transportation Needs: Not on file  Physical Activity: Not on file  Stress: Not on file  Social Connections: Not on file  Intimate Partner Violence: Not on file    Allergies  Allergen Reactions   Clarithromycin Shortness Of Breath   Codeine Shortness Of Breath   Hydrocodone Shortness Of Breath   Penicillins Shortness Of Breath and Other (See Comments)     PATIENT HAS HAD A PCN REACTION WITH IMMEDIATE RASH, FACIAL/TONGUE/THROAT SWELLING, SOB, OR LIGHTHEADEDNESS WITH HYPOTENSION:  #  #  #  YES  #  #  #   Has patient had a PCN reaction causing severe rash involving mucus membranes or skin necrosis: No Has patient had a PCN reaction that required hospitalization: No Has patient had a PCN reaction occurring within the last 10 years: No    Sulfa Antibiotics Shortness Of Breath   Dilaudid [Hydromorphone Hcl] Other (See Comments)    ORAL SORES   Oxycodone Hcl Nausea Only    Current Outpatient Medications  Medication Sig Dispense Refill   aspirin EC 81 MG tablet Take 1 tablet (81 mg total) by mouth daily with breakfast. 90 tablet 3   atorvastatin (LIPITOR) 40 MG tablet Take 40 mg by mouth daily.     glipiZIDE (GLUCOTROL) 5 MG tablet Take 5 mg by mouth daily before breakfast.      losartan (COZAAR) 50 MG tablet Take 50 mg by mouth 2 (two) times daily.     ondansetron (ZOFRAN-ODT) 4 MG disintegrating tablet 4mg  ODT q4 hours prn nausea/vomit 10 tablet 0   oxyCODONE-acetaminophen (PERCOCET/ROXICET) 5-325 MG tablet Take 1 every 6 hours as needed for pain not relieved by Tylenol 1 20 tablet 0   sitaGLIPtin (JANUVIA) 100 MG tablet Take 1 tablet (100 mg total) by mouth daily. 30 tablet 3   amLODipine (NORVASC) 10 MG tablet Take 1 tablet (10 mg total) by mouth daily. (Patient not taking: Reported on 03/07/2022) 90 tablet 3   diclofenac Sodium (VOLTAREN) 1 % GEL Apply 2 g topically 4 (four) times daily. (Patient not taking: Reported on 03/07/2022) 50 g 0   febuxostat (ULORIC) 40 MG tablet One daily for gout (Patient not taking: Reported on 03/07/2022) 30 tablet 5   methocarbamol (ROBAXIN) 500 MG tablet Take 1 tablet (500 mg total) by mouth 2 (two) times daily. (Patient not taking: Reported on 03/07/2022) 20 tablet 0   polyethylene glycol (MIRALAX / GLYCOLAX) 17 g packet Take 17 g by mouth daily. For constipation (Patient not taking: Reported on 03/07/2022) 30 each 0   No current facility-administered medications for this visit.    REVIEW OF SYSTEMS:  [X]  denotes positive finding, [ ]  denotes negative finding Cardiac  Comments:  Chest pain or chest pressure:    Shortness of breath upon exertion:    Short of breath when lying flat:    Irregular heart rhythm:        Vascular    Pain in calf, thigh, or hip brought on by ambulation:    Pain in feet at night that wakes you up from your sleep:     Blood clot in your veins:    Leg swelling:         Pulmonary    Oxygen at home:    Productive cough:     Wheezing:         Neurologic    Sudden weakness in arms or legs:     Sudden numbness in arms or legs:     Sudden onset of difficulty speaking or slurred speech:    Temporary loss of vision in one eye:     Problems with dizziness:         Gastrointestinal  Blood in  stool:     Vomited blood:         Genitourinary    Burning when urinating:     Blood in urine:        Psychiatric    Major depression:         Hematologic    Bleeding problems:    Problems with blood clotting too easily:        Skin    Rashes or ulcers:        Constitutional    Fever or chills:      PHYSICAL EXAM: Vitals:   03/13/22 1150  BP: (!) 173/91  Pulse: 63  Resp: 14  Temp: (!) 97.1 F (36.2 C)  TempSrc: Temporal  SpO2: 95%  Weight: 180 lb (81.6 kg)  Height: 5\' 4"  (1.626 m)    GENERAL: The patient is a well-nourished female, in no acute distress. The vital signs are documented above. CARDIAC: There is a regular rate and rhythm.  VASCULAR:  Palpable femoral pulses bilaterally No palpable pedal pulses Pain at right 1st MTP No tissue loss PULMONARY: No respiratory distress. ABDOMEN: Soft and non-tender. NEUROLOGIC: No focal weakness or paresthesias are detected. SKIN: There are no ulcers or rashes noted. PSYCHIATRIC: The patient has a normal affect.  DATA:   ABI's today are 0.73 right monophasic and 0.80 left monophasic  Assessment/Plan:  76 year old female presents for evaluation of PAD.  Discussed that her noninvasive imaging does show evidence of moderate PAD particularly on the right with an ABI of 0.73 monophasic at the ankle.  That being said her exam is consistent with gout.  She has no pain in the right great toe and no distal ulcerations.  All of her pain is at the right first MTP joint.  This is consistent with her previous gout episodes.  She is already on Uloric.  Discussed that I will have her see me again in a month to make sure her symptoms improve because I do not think her symptoms are related to PAD and I do not think she needs an invasive arteriogram at this time.  Certainly if her gout is adequately treated and she has ongoing issues the next step would be an aortogram with lower extremity arteriogram with a focus on the right leg.  She is  on aspirin statin for risk reduction.  Discussed smoking cessation and walking therapies as well.   73, MD [facility] [place] Office: [phone]

## 2022-03-14 ENCOUNTER — Ambulatory Visit: Payer: Medicare HMO | Attending: Cardiology | Admitting: Cardiology

## 2022-03-14 ENCOUNTER — Encounter: Payer: Self-pay | Admitting: Cardiology

## 2022-03-14 VITALS — BP 176/83 | HR 87 | Ht 64.0 in | Wt 182.0 lb

## 2022-03-14 DIAGNOSIS — I10 Essential (primary) hypertension: Secondary | ICD-10-CM

## 2022-03-14 MED ORDER — AMLODIPINE BESYLATE 10 MG PO TABS: 10.0000 mg | ORAL_TABLET | Freq: Every day | ORAL | 1 refills | Status: DC

## 2022-03-14 NOTE — Progress Notes (Signed)
Virtual Visit via Telephone Note   Because of Jessica Hoover's co-morbid illnesses, she is at least at moderate risk for complications without adequate follow up.  This format is felt to be most appropriate for this patient at this time.  The patient did not have access to video technology/had technical difficulties with video requiring transitioning to audio format only (telephone).  All issues noted in this document were discussed and addressed.  No physical exam could be performed with this format.  Please refer to the patient's chart for her consent to telehealth for [facility].    Date:  03/14/2022   ID:  URVI IMES, DOB [birth year], MRN [id number] The patient was identified using 2 identifiers.  Patient Location: Home Provider Location: Office/Clinic   PCP:  [name], MD   [facility] Providers Cardiologist:  Dina Rich, MD     Evaluation Performed:  Follow-Up Visit  Chief Complaint:  Follow up visit  History of Present Illness:    Jessica Hoover is a 76 y.o. female seen today for follow up of the following medical problems.   1. HTN - home bp's daily, sbp's 200 - she reports some recent kidney issues, pcp started losartan 50mg  daily 2 months - she reports pcp stopped norvasc, she reports was causing frequent urination and was affecting her kidneys. Reports pcp changed her to losartan. - records reviewed since last visit, do not see indication why norvasc was stopped.    Other medical issues not addressed this visit   1. 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  02/2022 echo aortic sclerosis without stenosis   - no SOB/DOE   2. Hyperlipidemia - compliant with meds 04/2021 TC 107 TG 69 HDL 29 LDL 64 -she reports she stopped taking statin on her own last week to see if was affecting her bp   3. Carotid  stenosis - 01/2019 mild disease bilaterally   4. TIA - left sided numbness back in March, head to toe. Symptoms lasted about 2 hours.  - she was seen by pcp few months later - intermittent episodes since that time, last episode 3 months ago.         6. CKD      7. Aortic sclerosis 02/2022 echo aortic valve sclerosis without stenosis.    8. PAD - followed by vascular   SH: works [employer] in 03/2022 Past Medical History:  Diagnosis Date   Arthritis    Chronic back pain    Chronic pelvic pain in female    pelvic joint and thigh   Chronic right hip pain    Chronic shoulder pain    Frequency of urination    Heart murmur    Hypertension    Lumbago    Neuralgia and neuritis    Osteoarthritis    Pneumonia    hx of   PONV (postoperative nausea and vomiting)    Radiculitis    Sciatica    Seizures (HCC)    had seizures as a child   Past Surgical History:  Procedure Laterality Date   ABDOMINAL HYSTERECTOMY     partial   BACK SURGERY     CHOLECYSTECTOMY     COLONOSCOPY W/ POLYPECTOMY     JOINT REPLACEMENT     TOTAL HIP ARTHROPLASTY Right 10/26/2014   Procedure: TOTAL HIP ARTHROPLASTY;  Surgeon: 12/26/2014, MD;  Location: [facility] OR;  Service: Orthopedics;  Laterality: Right;   TOTAL HIP ARTHROPLASTY Left 10/09/2016   TOTAL HIP ARTHROPLASTY Left 10/09/2016   Procedure: LEFT TOTAL HIP ARTHROPLASTY;  Surgeon: Valeria Batman, MD;  Location: [facility] OR;  Service: Orthopedics;  Laterality: Left;     No outpatient medications have been marked as taking for the 03/14/22 encounter (Appointment) with Antoine Poche, MD.     Allergies:   Clarithromycin, Codeine, Hydrocodone, Penicillins, Sulfa antibiotics, Dilaudid [hydromorphone hcl], and Oxycodone hcl   Social History   Tobacco Use   Smoking status: Some Days    Packs/day: 0.25    Years: 56.00    Total pack years: 14.00    Types: Cigarettes    Start date: 07/11/1965   Smokeless tobacco: Never   Tobacco comments:     3 per week  Vaping Use   Vaping Use: Never used  Substance Use Topics   Alcohol use: No    Alcohol/week: 0.0 standard drinks of alcohol   Drug use: No     Family Hx: The patient's family history includes Cancer in her father; Depression in her mother; Hypertension in her father and mother.  ROS:   Please see the history of present illness.     All other systems reviewed and are negative.   Prior CV studies:   The following studies were reviewed today:  05/2015 echo Study Conclusions   - Left ventricle: The cavity size was normal. Wall thickness was   increased in a pattern of moderate LVH. Systolic function was   normal. The estimated ejection fraction was in the range of 60%   to 65%. Wall motion was normal; there were no regional wall   motion abnormalities. Doppler parameters are consistent with   abnormal left ventricular relaxation (grade 1 diastolic   dysfunction). - Aortic valve: Mildly thickened, moderately calcified leaflets.   Aortic valve sclerosis noted. Morphologically, there appears to   be at least mild stenosis. No hemodynamic parameters obtained. - Mitral valve: Mildly calcified annulus.     01/2019 echo IMPRESSIONS     1. Left ventricular ejection fraction, by visual estimation, is 60 to  65%. The left ventricle has normal function. There is mildly increased  left ventricular hypertrophy.   2. Left ventricular diastolic parameters are consistent with Grade I  diastolic dysfunction (impaired relaxation).   3. Global right ventricle has normal systolic function.The right  ventricular size is normal. No increase in right ventricular wall  thickness.   4. Left atrial size was normal.   5. Right atrial size was normal.   6. Mild aortic valve annular calcification.   7. Mild mitral annular calcification.   8. The mitral valve is grossly normal. Trace mitral valve regurgitation.   9. The tricuspid valve is grossly normal. Tricuspid valve regurgitation   is trivial.  10. The aortic valve is tricuspid. Aortic valve regurgitation is not  visualized. Mild to moderate aortic valve sclerosis/calcification without  any evidence of aortic stenosis. Limited excursion of noncoronary cusp  with upper normal transvalvular  gradients.  11. The pulmonic valve was grossly normal. Pulmonic valve regurgitation is  not visualized.  12. The inferior vena cava is normal in size with greater than 50%  respiratory variability, suggesting right atrial pressure of 3 mmHg.      01/2019 carotid US Right:   Color duplex indicates moderate heterogeneous and calcified plaque, with no hemodynamically significant stenosis by duplex criteria in the extracranial cerebrovascular circulation.  Left:   Heterogeneous and calcified plaque at the left carotid bifurcation with less than 50% stenosis by established duplex criteria. Note that the flow velocities of the left ICA were obtained from an area distal to the maximum narrowing due to the presence of anterior wall plaque with shadowing and may be underestimating the percentage of ICA stenosis. If establishing a more accurate degree of stenosis is required, cerebral angiogram should be considered, or as a second best test, CTA.  02/2022 echo 1. Left ventricular ejection fraction, by estimation, is 60 to 65%. The  left ventricle has normal function. The left ventricle has no regional  wall motion abnormalities. There is moderate left ventricular hypertrophy.  Left ventricular diastolic  parameters are consistent with Grade I diastolic dysfunction (impaired  relaxation).   2. Right ventricular systolic function is normal. The right ventricular  size is normal.   3. The mitral valve is abnormal. No evidence of mitral valve  regurgitation. No evidence of mitral stenosis. Mild mitral annular  calcification.   4. The aortic valve is calcified with restricted mobility of non-coronary  cusp. Aortic valve  regurgitation is not visualized. No aortic stenosis is  present.   5. The inferior vena cava is normal in size with greater than 50%  respiratory variability, suggesting right atrial pressure of 3 mmHg.    Labs/Other Tests and Data Reviewed:    EKG:  No ECG reviewed.  Recent Labs: 05/01/2021: Magnesium 1.6 05/03/2021: TSH 3.336 03/07/2022: ALT 24; BUN 13; Creatinine, Ser 1.13; Hemoglobin 13.3; Platelets 201; Potassium 4.2; Sodium 139   Recent Lipid Panel Lab Results  Component Value Date/Time   CHOL 107 05/03/2021 05:00 AM   TRIG 69 05/03/2021 05:00 AM   HDL 29 (L) 05/03/2021 05:00 AM   CHOLHDL 3.7 05/03/2021 05:00 AM   LDLCALC 64 05/03/2021 05:00 AM    Wt Readings from Last 3 Encounters:  03/13/22 180 lb (81.6 kg)  03/07/22 182 lb (82.6 kg)  02/13/22 183 lb (83 kg)     Risk Assessment/Calculations:          Objective:    Vital Signs:   Today's Vitals   03/14/22 1334  BP: (!) 176/83  Pulse: 87  Weight: 182 lb (82.6 kg)  Height: 5\' 4"  (1.626 m)   Body mass index is 31.24 kg/m. Normal affect. Normal speech pattern and tone. Comfortable, no apparent distress. No audible signs of sob or wheezing.   ASSESSMENT & PLAN:    .    1. HTN - I have not seen her in 2 years. Appears off norvasc and now on losartan 50mg  daily. I am unclear what led to coming off norvasc, the symptosm she reported of frequent urination and changes in kidney function would not be from norvasc as she reported, I wonder if she may be confusing it with another medication - do not see indications from pcp clinic note why off norvasc as far as a side effect. BP's remain elevated, add back norvasc 10mg  daily -update on bps 1 week.           Time:   Today, I have spent 12 minutes with the patient with telehealth technology discussing the above problems.     Medication Adjustments/Labs and Tests Ordered: Current medicines are reviewed at length with the patient today.  Concerns regarding  medicines are outlined above.   Tests Ordered: No orders of the defined types were placed in this encounter.   Medication Changes: No orders of the  defined types were placed in this encounter.   Follow Up:  In Person 6 months  Signed, Dina RichBranch, Lalonnie Shaffer, MD  03/14/2022 9:21 AM    [facility]

## 2022-03-14 NOTE — Patient Instructions (Addendum)
Medication Instructions:  Your physician has recommended you make the following change in your medication:  Start amlodipine 10 mg daily Continue other medications the same  Labwork: none  Testing/Procedures: none  Follow-Up: Your physician recommends that you schedule a follow-up appointment in: 6 months  Any Other Special Instructions Will Be Listed Below (If Applicable). Your physician has requested that you regularly monitor and record your blood pressure readings at home. Please use the same machine at the same time of day to check your readings and record them. Call office in one week with your home blood pressure readings  If you need a refill on your cardiac medications before your next appointment, please call your pharmacy.

## 2022-03-29 ENCOUNTER — Telehealth: Payer: Self-pay | Admitting: Cardiology

## 2022-03-29 DIAGNOSIS — Z79899 Other long term (current) drug therapy: Secondary | ICD-10-CM

## 2022-03-29 DIAGNOSIS — I10 Essential (primary) hypertension: Secondary | ICD-10-CM

## 2022-03-29 NOTE — Telephone Encounter (Signed)
   Pt c/o BP issue: STAT if pt c/o blurred vision, one-sided weakness or slurred speech  1. What are your last 5 BP readings?  12.25  171/84   12.26 141/79  12.27 164/78  12.28 172/68  12.29 169/78  12.30 154/78  12.31 146/67  01.01.24 154/71  01.02 157/76  01.03 140/66  2. Are you having any other symptoms (ex. Dizziness, headache, blurred vision, passed out)?   3. What is your BP issue? Pt provided her BP reading.

## 2022-04-02 MED ORDER — SPIRONOLACTONE 25 MG PO TABS: 25.0000 mg | ORAL_TABLET | Freq: Every day | ORAL | 6 refills | Status: DC

## 2022-04-02 NOTE — Telephone Encounter (Signed)
  Laurine Blazer, LPN 04/30/9561 87:56 AM EST Back to Top    Notified, copy to pcp.

## 2022-04-02 NOTE — Telephone Encounter (Signed)
-----   Message from Arnoldo Lenis, MD sent at 03/30/2022 10:10 AM EST ----- Echo shows normal heart pumping function, aortic valve slightly thicker than normal but working fine which creates her murmur. Nothing of concern at this time  J BrancH MD

## 2022-04-02 NOTE — Telephone Encounter (Signed)
Patient notified and verbalized understanding.  New prescription sent to [facility] now.  Will mail order for labs ([facility]) to be done in 2 weeks & she will call back with update on her BP readings at that time.

## 2022-04-02 NOTE — Addendum Note (Signed)
Addended by: Laurine Blazer on: 04/02/2022 01:22 PM   Modules accepted: Orders

## 2022-04-02 NOTE — Telephone Encounter (Signed)
BP too high, can she start aldactone 25mg  daily and get bmet 2 weeks. Update [place] on bp's 2 weeks  Zandra Abts MD

## 2022-04-10 ENCOUNTER — Encounter: Payer: Self-pay | Admitting: Vascular Surgery

## 2022-04-10 ENCOUNTER — Ambulatory Visit: Payer: Medicare HMO | Admitting: Vascular Surgery

## 2022-04-10 VITALS — BP 140/72 | HR 74 | Temp 97.7°F | Resp 14 | Ht 64.0 in | Wt 179.0 lb

## 2022-04-10 DIAGNOSIS — I739 Peripheral vascular disease, unspecified: Secondary | ICD-10-CM

## 2022-04-10 NOTE — Progress Notes (Signed)
Patient name: Jessica Hoover MRN: [id number] DOB: Mar 20, 1946 Sex: female  REASON FOR CONSULT: one month follow-up PAD  HPI: Jessica Hoover is a 77 y.o. female, with hx of HTN that presents for 3 month follow-up of her PAD.  Patient previously presented with acute pain in her right first MTP joint last month.  She was prescribed Uloric for gout.  She said this felt very similar to previous gout episodes.  She was also referred to our office for evaluation of PAD.  She works at [employer] and is on her feet for long periods of time.  She had no pain in her legs when walking or standing.    On follow-up today the pain in her right great toe has resolved with gout treatment.  She has no complaints today.  No pain in either foot.  No pain with walking.  She does smoke.  Past Medical History:  Diagnosis Date   Arthritis    Chronic back pain    Chronic pelvic pain in female    pelvic joint and thigh   Chronic right hip pain    Chronic shoulder pain    Frequency of urination    Heart murmur    Hypertension    Lumbago    Neuralgia and neuritis    Osteoarthritis    Pneumonia    hx of   PONV (postoperative nausea and vomiting)    Radiculitis    Sciatica    Seizures (HCC)    had seizures as a child    Past Surgical History:  Procedure Laterality Date   ABDOMINAL HYSTERECTOMY     partial   BACK SURGERY     CHOLECYSTECTOMY     COLONOSCOPY W/ POLYPECTOMY     JOINT REPLACEMENT     TOTAL HIP ARTHROPLASTY Right 10/26/2014   Procedure: TOTAL HIP ARTHROPLASTY;  Surgeon: Valeria Batman, MD;  Location: [facility] OR;  Service: Orthopedics;  Laterality: Right;   TOTAL HIP ARTHROPLASTY Left 10/09/2016   TOTAL HIP ARTHROPLASTY Left 10/09/2016   Procedure: LEFT TOTAL HIP ARTHROPLASTY;  Surgeon: Valeria Batman, MD;  Location: [facility] OR;  Service: Orthopedics;  Laterality: Left;    Family History  Problem Relation Age of Onset   Hypertension Mother    Depression Mother    Hypertension Father    Cancer Father      SOCIAL HISTORY: Social History   Socioeconomic History   Marital status: Widowed    Spouse name: Not on file   Number of children: Not on file   Years of education: Not on file   Highest education level: Not on file  Occupational History   Not on file  Tobacco Use   Smoking status: Some Days    Packs/day: 0.25    Years: 56.00    Total pack years: 14.00    Types: Cigarettes    Start date: 07/11/1965   Smokeless tobacco: Never   Tobacco comments:    3 per week  Vaping Use   Vaping Use: Never used  Substance and Sexual Activity   Alcohol use: No    Alcohol/week: 0.0 standard drinks of alcohol   Drug use: No   Sexual activity: Not Currently  Other Topics Concern   Not on file  Social History Narrative   Not on file   Social Determinants of Health   Financial Resource Strain: Not on file  Food Insecurity: Not on file  Transportation Needs: Not on file  Physical Activity: Not  on file  Stress: Not on file  Social Connections: Not on file  Intimate Partner Violence: Not on file    Allergies  Allergen Reactions   Clarithromycin Shortness Of Breath   Codeine Shortness Of Breath   Hydrocodone Shortness Of Breath   Penicillins Shortness Of Breath and Other (See Comments)     PATIENT HAS HAD A PCN REACTION WITH IMMEDIATE RASH, FACIAL/TONGUE/THROAT SWELLING, SOB, OR LIGHTHEADEDNESS WITH HYPOTENSION:  #  #  #  YES  #  #  #   Has patient had a PCN reaction causing severe rash involving mucus membranes or skin necrosis: No Has patient had a PCN reaction that required hospitalization: No Has patient had a PCN reaction occurring within the last 10 years: No    Sulfa Antibiotics Shortness Of Breath   Dilaudid [Hydromorphone Hcl] Other (See Comments)    ORAL SORES   Oxycodone Hcl Nausea Only    Current Outpatient Medications  Medication Sig Dispense Refill   amLODipine (NORVASC) 10 MG tablet Take 1 tablet (10 mg total) by mouth daily. 90 tablet 1   atorvastatin  (LIPITOR) 40 MG tablet Take 40 mg by mouth daily.     febuxostat (ULORIC) 40 MG tablet One daily for gout 30 tablet 5   glipiZIDE (GLUCOTROL) 5 MG tablet Take 5 mg by mouth daily before breakfast.     losartan (COZAAR) 50 MG tablet Take 50 mg by mouth 2 (two) times daily.     Menthol, Topical Analgesic, (BIOFREEZE) 4 % GEL Apply 1 Application topically as needed.     spironolactone (ALDACTONE) 25 MG tablet Take 1 tablet (25 mg total) by mouth daily. 30 tablet 6   No current facility-administered medications for this visit.    REVIEW OF SYSTEMS:  [X]  denotes positive finding, [ ]  denotes negative finding Cardiac  Comments:  Chest pain or chest pressure:    Shortness of breath upon exertion:    Short of breath when lying flat:    Irregular heart rhythm:        Vascular    Pain in calf, thigh, or hip brought on by ambulation:    Pain in feet at night that wakes you up from your sleep:     Blood clot in your veins:    Leg swelling:         Pulmonary    Oxygen at home:    Productive cough:     Wheezing:         Neurologic    Sudden weakness in arms or legs:     Sudden numbness in arms or legs:     Sudden onset of difficulty speaking or slurred speech:    Temporary loss of vision in one eye:     Problems with dizziness:         Gastrointestinal    Blood in stool:     Vomited blood:         Genitourinary    Burning when urinating:     Blood in urine:        Psychiatric    Major depression:         Hematologic    Bleeding problems:    Problems with blood clotting too easily:        Skin    Rashes or ulcers:        Constitutional    Fever or chills:      PHYSICAL EXAM: Vitals:   04/10/22 0857  BP: (!) 140/72  Pulse: 74  Resp: 14  Temp: 97.7 F (36.5 C)  TempSrc: Temporal  SpO2: 96%  Weight: 179 lb (81.2 kg)  Height: 5\' 4"  (1.626 m)    GENERAL: The patient is a well-nourished female, in no acute distress. The vital signs are documented above. CARDIAC:  There is a regular rate and rhythm.  VASCULAR:  Palpable femoral pulses bilaterally No palpable pedal pulses Right DP monophasic with no palpable pedal pulses No lower extremity tissue loss PULMONARY: No respiratory distress. ABDOMEN: Soft and non-tender. NEUROLOGIC: No focal weakness or paresthesias are detected. PSYCHIATRIC: The patient has a normal affect.  DATA:   ABI's last month were 0.73 right monophasic and 0.80 left monophasic  Assessment/Plan:  77 year old female presents for one month follow-up of her PAD.  I saw her last month with pain in the right great toe that was very similar to previous gout episodes.  I thought this was related to gout and recommended treatment of her gout first.  She has now had complete resolution of symptoms.  Her PAD is asymptomatic.  She does have ABIs of 0.73 on the right and 0.8 on the left monophasic but has no pain in her legs when walking and now all of her great toe pain is resolved after treatment of her gout episode.  I recommended smoking cessation.  I will have her follow-up in 1 year in the PA clinic with ABIs for ongoing surveillance.  Discussed aspirin and statin for risk reduction.  Discussed no indication for intervention for asymptomatic PAD.  Marty Heck, MD [facility] Office: [phone]

## 2022-04-16 ENCOUNTER — Other Ambulatory Visit: Payer: Self-pay | Admitting: Cardiology

## 2022-04-17 LAB — BASIC METABOLIC PANEL WITH GFR
BUN/Creatinine Ratio: 14 (calc) (ref 6–22)
BUN: 17 mg/dL (ref 7–25)
CO2: 24 mmol/L (ref 20–32)
Calcium: 10.1 mg/dL (ref 8.6–10.4)
Chloride: 105 mmol/L (ref 98–110)
Creat: 1.19 mg/dL — ABNORMAL HIGH (ref 0.60–1.00)
Glucose, Bld: 144 mg/dL — ABNORMAL HIGH (ref 65–99)
Potassium: 4.2 mmol/L (ref 3.5–5.3)
Sodium: 138 mmol/L (ref 135–146)
eGFR: 47 mL/min/{1.73_m2} — ABNORMAL LOW (ref >=60)

## 2022-04-30 ENCOUNTER — Telehealth: Payer: Self-pay

## 2022-04-30 NOTE — Telephone Encounter (Signed)
-----   Message from Arnoldo Lenis, MD sent at 04/29/2022  6:31 PM EST ----- Slight decrease in kidney function, needs to be sure to stay well hdyrated and drink at least 2-3 liters of water daily  Zandra Abts MD

## 2022-04-30 NOTE — Telephone Encounter (Signed)
Patient notified and verbalized understanding. Patient had no questions or concerns at this time. PCP copied 

## 2022-06-25 ENCOUNTER — Other Ambulatory Visit (HOSPITAL_COMMUNITY): Payer: Self-pay | Admitting: Nephrology

## 2022-06-25 DIAGNOSIS — E1122 Type 2 diabetes mellitus with diabetic chronic kidney disease: Secondary | ICD-10-CM

## 2022-06-25 DIAGNOSIS — R809 Proteinuria, unspecified: Secondary | ICD-10-CM

## 2022-07-02 ENCOUNTER — Ambulatory Visit (HOSPITAL_COMMUNITY)
Admission: RE | Admit: 2022-07-02 | Discharge: 2022-07-02 | Disposition: A | Discharge status: Home / Self Care | Payer: Medicare HMO | Source: Ambulatory Visit | Attending: Nephrology | Admitting: Nephrology

## 2022-07-02 DIAGNOSIS — E1129 Type 2 diabetes mellitus with other diabetic kidney complication: Secondary | ICD-10-CM | POA: Diagnosis present

## 2022-07-02 DIAGNOSIS — E1122 Type 2 diabetes mellitus with diabetic chronic kidney disease: Secondary | ICD-10-CM | POA: Insufficient documentation

## 2022-07-02 DIAGNOSIS — R809 Proteinuria, unspecified: Secondary | ICD-10-CM | POA: Insufficient documentation

## 2022-08-25 ENCOUNTER — Other Ambulatory Visit: Payer: Self-pay | Admitting: Cardiology

## 2022-10-21 ENCOUNTER — Other Ambulatory Visit: Payer: Self-pay | Admitting: Cardiology

## 2022-11-01 ENCOUNTER — Encounter: Payer: Self-pay | Admitting: Student

## 2022-11-01 ENCOUNTER — Ambulatory Visit: Payer: Medicare HMO | Admitting: Student

## 2022-11-01 ENCOUNTER — Ambulatory Visit: Payer: Medicare HMO | Attending: Student | Admitting: Student

## 2022-11-01 VITALS — BP 126/70 | HR 80 | Ht 64.0 in | Wt 186.6 lb

## 2022-11-01 DIAGNOSIS — Z79899 Other long term (current) drug therapy: Secondary | ICD-10-CM

## 2022-11-01 DIAGNOSIS — I10 Essential (primary) hypertension: Secondary | ICD-10-CM | POA: Diagnosis not present

## 2022-11-01 DIAGNOSIS — E785 Hyperlipidemia, unspecified: Secondary | ICD-10-CM

## 2022-11-01 DIAGNOSIS — I358 Other nonrheumatic aortic valve disorders: Secondary | ICD-10-CM

## 2022-11-01 DIAGNOSIS — N1831 Chronic kidney disease, stage 3a: Secondary | ICD-10-CM

## 2022-11-01 DIAGNOSIS — I6523 Occlusion and stenosis of bilateral carotid arteries: Secondary | ICD-10-CM

## 2022-11-01 MED ORDER — AMLODIPINE BESYLATE 10 MG PO TABS: 10.0000 mg | ORAL_TABLET | Freq: Every day | ORAL | 3 refills | Status: DC

## 2022-11-01 MED ORDER — SPIRONOLACTONE 25 MG PO TABS: 25.0000 mg | ORAL_TABLET | Freq: Every day | ORAL | 3 refills | Status: DC

## 2022-11-01 MED ORDER — LOSARTAN POTASSIUM 50 MG PO TABS: 50.0000 mg | ORAL_TABLET | Freq: Two times a day (BID) | ORAL | 3 refills | Status: DC

## 2022-11-01 NOTE — Patient Instructions (Signed)
Medication Instructions:  Your physician recommends that you continue on your current medications as directed. Please refer to the Current Medication list given to you today.  *If you need a refill on your cardiac medications before your next appointment, please call your pharmacy*   Lab Work: NONE   If you have labs (blood work) drawn today and your tests are completely normal, you will receive your results only by: MyChart Message (if you have MyChart) OR A paper copy in the mail If you have any lab test that is abnormal or we need to change your treatment, we will call you to review the results.   Testing/Procedures: Your physician has requested that you have a carotid duplex. This test is an ultrasound of the carotid arteries in your neck. It looks at blood flow through these arteries that supply the brain with blood. Allow one hour for this exam. There are no restrictions or special instructions.    Follow-Up: At [facility], you and your health needs are our priority.  As part of our continuing mission to provide you with exceptional heart care, we have created designated Provider Care Teams.  These Care Teams include your primary Cardiologist (physician) and Advanced Practice Providers (APPs -  Physician Assistants and Nurse Practitioners) who all work together to provide you with the care you need, when you need it.  We recommend signing up for the patient portal called "MyChart".  Sign up information is provided on this After Visit Summary.  MyChart is used to connect with patients for Virtual Visits (Telemedicine).  Patients are able to view lab/test results, encounter notes, upcoming appointments, etc.  Non-urgent messages can be sent to your provider as well.   To learn more about what you can do with MyChart, go to [email or web address].    Your next appointment:   6 month(s)  Provider:   You may see Dina Rich, MD or one of the following Advanced Practice  Providers on your designated Care Team:   Randall An, PA-C  Jacolyn Reedy, [place]     Other Instructions Thank you for choosing [facility]!

## 2022-11-01 NOTE — Progress Notes (Deleted)
Cardiology Office Note    Date:  11/01/2022  ID:  Jessica, Hoover [birth year], MRN [id number] Cardiologist: Dina Rich, MD    History of Present Illness:    Jessica Hoover is a 77 y.o. female with past medical history of HTN, HLD, aortic sclerosis, carotid artery stenosis, PAD, Stage 3 CKD and history of TIA who presents to the office today for 75-month follow-up.  She most recently had a telehealth visit with Dr. Wyline Mood in 02/2022 and reported that her BP had been elevated at home with SBP in the 200s at times.  Amlodipine had been discontinued in the interim and she was taking losartan 50 mg daily.  It was recommended to restart amlodipine 10 mg daily and follow-up with BP readings in 1 week.  She did report back with readings and BP remained elevated, therefore she was started on Spironolactone 25 mg daily in 03/2022.  ROS: ***  Studies Reviewed:   EKG: EKG is*** ordered today and demonstrates ***   EKG Interpretation Date/Time:    Ventricular Rate:    PR Interval:    QRS Duration:    QT Interval:    QTC Calculation:   R Axis:      Text Interpretation:         Carotid Dopplers: 02/2022 IMPRESSION: 1. Bilateral carotid arteries demonstrate similar moderate atherosclerotic plaque of the carotid bulbs, which remain less than 50% stenosis by Doppler criteria. 2. Bilateral vertebral arteries demonstrate normal antegrade flow.  Echocardiogram: 02/2022 IMPRESSIONS     1. Left ventricular ejection fraction, by estimation, is 60 to 65%. The  left ventricle has normal function. The left ventricle has no regional  wall motion abnormalities. There is moderate left ventricular hypertrophy.  Left ventricular diastolic  parameters are consistent with Grade I diastolic dysfunction (impaired  relaxation).   2. Right ventricular systolic function is normal. The right ventricular  size is normal.   3. The mitral valve is abnormal. No evidence of mitral valve  regurgitation. No  evidence of mitral stenosis. Mild mitral annular  calcification.   4. The aortic valve is calcified with restricted mobility of non-coronary  cusp. Aortic valve regurgitation is not visualized. No aortic stenosis is  present.   5. The inferior vena cava is normal in size with greater than 50%  respiratory variability, suggesting right atrial pressure of 3 mmHg.   Comparison(s): No significant change from prior study.    Risk Assessment/Calculations:   {Does this patient have ATRIAL FIBRILLATION?:[id number]} No BP recorded.  {Refresh Note OR Click here to enter BP  :1}***         Physical Exam:   VS:  There were no vitals taken for this visit.   Wt Readings from Last 3 Encounters:  04/10/22 179 lb (81.2 kg)  03/14/22 182 lb (82.6 kg)  03/13/22 180 lb (81.6 kg)     GEN: Well nourished, well developed in no acute distress NECK: No JVD; No carotid bruits CARDIAC: ***RRR, no murmurs, rubs, gallops RESPIRATORY:  Clear to auscultation without rales, wheezing or rhonchi  ABDOMEN: Appears non-distended. No obvious abdominal masses. EXTREMITIES: No clubbing or cyanosis. No edema.  Distal pedal pulses are 2+ bilaterally.   Assessment and Plan:   1. HTN - ***  2. HLD - Followed by her PCP. She remains on Atorvastatin 40 mg daily.  3. Aortic Sclerosis - Echocardiogram in 02/2022 showed aortic valve sclerosis without stenosis.  4. Carotid Artery Stenosis - Dopplers in 02/2022 showed moderate  atherosclerotic plaque with less than 50% stenosis bilaterally.  5. Stage 3 CKD - Followed by Dr. Wolfgang Phoenix. Creatinine was stable at 1.17 in 06/2022.   Signed, Ellsworth Lennox, PA-C

## 2022-11-01 NOTE — Progress Notes (Addendum)
Cardiology Office Note    Date:  11/01/2022  ID:  Jessica Hoover, Jessica Hoover 1945-09-22, MRN [id number] Cardiologist: Dina Rich, MD    History of Present Illness:    Jessica Hoover is a 77 y.o. female with past medical history of HTN, HLD, aortic sclerosis, carotid artery stenosis, PAD, Stage 3 CKD and history of TIA who presents to the office today for 55-month follow-up.  She most recently had a telehealth visit with Dr. Wyline Mood in 02/2022 and reported that her BP had been elevated at home with SBP in the 200's at times. Amlodipine had been discontinued in the interim and she was taking Losartan 50 mg daily. It was recommended to restart Amlodipine 10 mg daily and follow-up with BP readings in 1 week. She did report back with readings and BP remained elevated, therefore she was started on Spironolactone 25 mg daily in 03/2022.  In talking with the patient today, she reports overall doing well since her last office visit.  She continues to work part-time at [employer] and enjoys this. She denies any recent chest pain or dyspnea on exertion while at work or when doing routine chores around her home. No recent palpitations, orthopnea, PND or pitting edema. Reports her blood pressure has been well-controlled when checked at home over the past several months. She is scheduled for follow-up labs with her PCP next week.   Studies Reviewed:   EKG: EKG is ordered today and demonstrates:   EKG Interpretation Date/Time:  Thursday November 01 2022 12:30:07 EDT Ventricular Rate:  75 PR Interval:  190 QRS Duration:  94 QT Interval:  388 QTC Calculation: 433 R Axis:   -37  Text Interpretation: Normal sinus rhythm Left axis deviation Minimal voltage criteria for LVH, may be normal variant ( R in aVL ) Confirmed by Randall An ([id number]) on 11/01/2022 12:33:54 PM       Carotid Dopplers: 02/2022 IMPRESSION: 1. Bilateral carotid arteries demonstrate similar moderate atherosclerotic plaque of the carotid  bulbs, which remain less than 50% stenosis by Doppler criteria. 2. Bilateral vertebral arteries demonstrate normal antegrade flow.  Echocardiogram: 02/2022 IMPRESSIONS     1. Left ventricular ejection fraction, by estimation, is 60 to 65%. The  left ventricle has normal function. The left ventricle has no regional  wall motion abnormalities. There is moderate left ventricular hypertrophy.  Left ventricular diastolic  parameters are consistent with Grade I diastolic dysfunction (impaired  relaxation).   2. Right ventricular systolic function is normal. The right ventricular  size is normal.   3. The mitral valve is abnormal. No evidence of mitral valve  regurgitation. No evidence of mitral stenosis. Mild mitral annular  calcification.   4. The aortic valve is calcified with restricted mobility of non-coronary  cusp. Aortic valve regurgitation is not visualized. No aortic stenosis is  present.   5. The inferior vena cava is normal in size with greater than 50%  respiratory variability, suggesting right atrial pressure of 3 mmHg.   Comparison(s): No significant change from prior study.    Physical Exam:   VS:  BP 126/70   Pulse 80   Ht 5\' 4"  (1.626 m)   Wt 186 lb 9.6 oz (84.6 kg)   SpO2 97%   BMI 32.03 kg/m    Wt Readings from Last 3 Encounters:  11/01/22 186 lb 9.6 oz (84.6 kg)  04/10/22 179 lb (81.2 kg)  03/14/22 182 lb (82.6 kg)     GEN: Well nourished, well developed female  appearing in no acute distress NECK: No JVD; No carotid bruits CARDIAC: RRR, 2/6 systolic murmur along RUSB.  RESPIRATORY:  Clear to auscultation without rales, wheezing or rhonchi  ABDOMEN: Appears non-distended. No obvious abdominal masses. EXTREMITIES: No clubbing or cyanosis. No pitting edema.  Distal pedal pulses are 2+ bilaterally.   Assessment and Plan:   1. HTN - Her blood pressure is well-controlled at 126/70 during today's visit and has also been well-controlled when checked at  home. Continue current medical therapy with Amlodipine 10 mg daily, Losartan 50 mg twice daily and Spironolactone 25 mg daily.  2. HLD - Followed by her PCP. She is scheduled for follow-up labs next week. She remains on Atorvastatin 40 mg daily.  3. Aortic Sclerosis - Echocardiogram in 02/2022 showed aortic valve sclerosis without stenosis. No indication for repeat imaging at this time.   4. Carotid Artery Stenosis - Dopplers in 02/2022 showed moderate atherosclerotic plaque with less than 50% stenosis bilaterally. Will plan for repeat carotid dopplers after 02/2023. Continue statin therapy.   5. Stage 3 CKD - Followed by Dr. Wolfgang Phoenix. Creatinine was stable at 1.17 in 06/2022.   Signed, Ellsworth Lennox, PA-C

## 2022-11-02 ENCOUNTER — Ambulatory Visit: Payer: Medicare HMO | Admitting: Student

## 2023-03-07 ENCOUNTER — Ambulatory Visit (HOSPITAL_COMMUNITY)
Admission: RE | Admit: 2023-03-07 | Discharge: 2023-03-07 | Disposition: A | Discharge status: Home / Self Care | Payer: Medicare HMO | Source: Ambulatory Visit | Attending: Student | Admitting: Student

## 2023-03-07 DIAGNOSIS — I6523 Occlusion and stenosis of bilateral carotid arteries: Secondary | ICD-10-CM | POA: Diagnosis present

## 2023-03-11 ENCOUNTER — Encounter: Payer: Self-pay | Admitting: Internal Medicine

## 2023-03-11 ENCOUNTER — Telehealth: Payer: Self-pay | Admitting: *Deleted

## 2023-03-11 ENCOUNTER — Telehealth: Payer: Self-pay | Admitting: Cardiology

## 2023-03-11 DIAGNOSIS — I6523 Occlusion and stenosis of bilateral carotid arteries: Secondary | ICD-10-CM

## 2023-03-11 DIAGNOSIS — E785 Hyperlipidemia, unspecified: Secondary | ICD-10-CM

## 2023-03-11 DIAGNOSIS — I10 Essential (primary) hypertension: Secondary | ICD-10-CM

## 2023-03-11 NOTE — Telephone Encounter (Signed)
error 

## 2023-03-11 NOTE — Telephone Encounter (Signed)
Returned call to pt with test results.

## 2023-03-11 NOTE — Telephone Encounter (Signed)
Pt returning call to nurse for results

## 2023-03-11 NOTE — Telephone Encounter (Signed)
Pt notified and orders placed  

## 2023-03-11 NOTE — Telephone Encounter (Signed)
-----   Message from [place] sent at 03/08/2023 10:41 AM EST ----- Please let the patient know that her repeat carotid doppler study showed a large amount of plaque bilaterally and was read as having progressed since 02/2022.  Full examination was degraded due to shadowing from the plaque and a CTA Neck was recommended for further evaluation. If she is in agreement with this, please order a CTA Neck she will need a repeat BMET with this.

## 2023-03-25 ENCOUNTER — Ambulatory Visit (HOSPITAL_COMMUNITY)
Admission: RE | Admit: 2023-03-25 | Discharge: 2023-03-25 | Disposition: A | Discharge status: Home / Self Care | Payer: Medicare HMO | Source: Ambulatory Visit | Attending: Student | Admitting: Student

## 2023-03-25 ENCOUNTER — Encounter (HOSPITAL_COMMUNITY): Payer: Self-pay | Admitting: Radiology

## 2023-03-25 DIAGNOSIS — I6523 Occlusion and stenosis of bilateral carotid arteries: Secondary | ICD-10-CM | POA: Insufficient documentation

## 2023-03-25 MED ORDER — IOHEXOL 350 MG/ML SOLN
75.0000 mL | Freq: Once | INTRAVENOUS | Status: AC | PRN
  Administered 2023-03-25: 60 mL via INTRAVENOUS

## 2023-03-26 LAB — POCT I-STAT CREATININE: Creatinine, Ser: 1.4 mg/dL — ABNORMAL HIGH (ref 0.44–1.00)

## 2023-04-08 ENCOUNTER — Telehealth: Payer: Self-pay

## 2023-04-08 DIAGNOSIS — I6523 Occlusion and stenosis of bilateral carotid arteries: Secondary | ICD-10-CM

## 2023-04-08 NOTE — Telephone Encounter (Signed)
-----   Message from Laymon CHRISTELLA Qua sent at 04/06/2023 11:18 AM EST ----- Please let the patient know her CTA Neck showed up to 60% plaque along her left internal carotid artery and plaque appeared to overall be less along the right side but there is an area which showed more significant plaque suggesting this was limiting blood flow. Given the significant progression from 2023, I would recommend she be referred to [facility] (can refer to the [facility] location) to see if they think this needs treatment currently or will need to be followed over time. Continue statin therapy.

## 2023-04-08 NOTE — Telephone Encounter (Signed)
 Results discussed with patient. She agrees to see [facility] in [place], referral placed,pcp copied

## 2023-04-12 ENCOUNTER — Emergency Department
Admission: EM | Admit: 2023-04-12 | Discharge: 2023-04-12 | Disposition: A | Discharge status: Home / Self Care | Payer: Medicare HMO | Attending: Emergency Medicine | Admitting: Emergency Medicine

## 2023-04-12 ENCOUNTER — Other Ambulatory Visit: Payer: Self-pay

## 2023-04-12 DIAGNOSIS — R112 Nausea with vomiting, unspecified: Secondary | ICD-10-CM | POA: Diagnosis present

## 2023-04-12 DIAGNOSIS — E7800 Pure hypercholesterolemia, unspecified: Secondary | ICD-10-CM | POA: Diagnosis not present

## 2023-04-12 DIAGNOSIS — R1010 Upper abdominal pain, unspecified: Secondary | ICD-10-CM | POA: Insufficient documentation

## 2023-04-12 DIAGNOSIS — Z20822 Contact with and (suspected) exposure to covid-19: Secondary | ICD-10-CM | POA: Insufficient documentation

## 2023-04-12 LAB — COMPREHENSIVE METABOLIC PANEL
ALT: 16 U/L (ref 0–44)
AST: 20 U/L (ref 15–41)
Albumin: 4.3 g/dL (ref 3.5–5.0)
Alkaline Phosphatase: 70 U/L (ref 38–126)
Anion gap: 9 (ref 5–15)
BUN: 24 mg/dL — ABNORMAL HIGH (ref 8–23)
CO2: 25 mmol/L (ref 22–32)
Calcium: 10 mg/dL (ref 8.9–10.3)
Chloride: 104 mmol/L (ref 98–111)
Creatinine, Ser: 1.37 mg/dL — ABNORMAL HIGH (ref 0.44–1.00)
GFR, Estimated: 40 mL/min — ABNORMAL LOW (ref >60)
Glucose, Bld: 119 mg/dL — ABNORMAL HIGH (ref 70–99)
Potassium: 4.9 mmol/L (ref 3.5–5.1)
Sodium: 138 mmol/L (ref 135–145)
Total Bilirubin: 0.4 mg/dL (ref 0.0–1.2)
Total Protein: 7.8 g/dL (ref 6.5–8.1)

## 2023-04-12 LAB — URINALYSIS, ROUTINE W REFLEX MICROSCOPIC
Bilirubin Urine: NEGATIVE
Glucose, UA: NEGATIVE mg/dL
Hgb urine dipstick: NEGATIVE
Ketones, ur: NEGATIVE mg/dL
Leukocytes,Ua: NEGATIVE
Nitrite: NEGATIVE
Protein, ur: NEGATIVE mg/dL
Specific Gravity, Urine: 1.01 (ref 1.005–1.030)
pH: 5 (ref 5.0–8.0)

## 2023-04-12 LAB — RESP PANEL BY RT-PCR (RSV, FLU A&B, COVID)  RVPGX2
Influenza A by PCR: NEGATIVE
Influenza B by PCR: NEGATIVE
Resp Syncytial Virus by PCR: NEGATIVE
SARS Coronavirus 2 by RT PCR: NEGATIVE

## 2023-04-12 LAB — CBC
HCT: 41.5 % (ref 36.0–46.0)
Hemoglobin: 12.9 g/dL (ref 12.0–15.0)
MCH: 28.5 pg (ref 26.0–34.0)
MCHC: 31.1 g/dL (ref 30.0–36.0)
MCV: 91.6 fL (ref 80.0–100.0)
Platelets: 251 10*3/uL (ref 150–400)
RBC: 4.53 MIL/uL (ref 3.87–5.11)
RDW: 12.9 % (ref 11.5–15.5)
WBC: 6.8 10*3/uL (ref 4.0–10.5)
nRBC: 0 % (ref 0.0–0.2)

## 2023-04-12 LAB — TROPONIN I (HIGH SENSITIVITY)
Troponin I (High Sensitivity): <2 ng/L (ref <18)
Troponin I (High Sensitivity): 3 ng/L (ref <18)

## 2023-04-12 LAB — LIPASE, BLOOD: Lipase: 45 U/L (ref 11–51)

## 2023-04-12 MED ORDER — ONDANSETRON 4 MG PO TBDP: 4.0000 mg | ORAL_TABLET | Freq: Three times a day (TID) | ORAL | 0 refills | Status: DC | PRN

## 2023-04-12 MED ORDER — ACETAMINOPHEN 500 MG PO TABS
1000.0000 mg | ORAL_TABLET | Freq: Once | ORAL | Status: AC
  Administered 2023-04-12: 1000 mg via ORAL
  Filled 2023-04-12: qty 2

## 2023-04-12 MED ORDER — ONDANSETRON 4 MG PO TBDP
4.0000 mg | ORAL_TABLET | Freq: Once | ORAL | Status: AC
  Administered 2023-04-12: 4 mg via ORAL
  Filled 2023-04-12: qty 1

## 2023-04-12 MED ORDER — OMEPRAZOLE MAGNESIUM 20 MG PO TBEC: 20.0000 mg | DELAYED_RELEASE_TABLET | Freq: Every day | ORAL | 0 refills | Status: DC

## 2023-04-12 NOTE — Discharge Instructions (Signed)
You were seen in the emergency department for nausea and vomiting.  Your EKG and your heart enzymes were normal.  Your lab work was normal.  Your COVID and influenza test were negative.  You could have bad gastritis or symptoms of a side effect from the new medication that you started.  You are given a prescription for nausea medication.  Follow-up closely with your primary care physician.  You were started on an acid reducing medication.  Return to the emergency department if you have any worsening symptoms.  Thank you for choosing [place] for your health care, it was my pleasure to care for you today!  Corena Herter, MD

## 2023-04-12 NOTE — ED Provider Notes (Signed)
[facility] Provider Note    Event Date/Time   First MD Initiated Contact with Patient 04/12/23 1803     (approximate)   History   Emesis and Medication Reaction   HPI  Jessica Hoover is a 78 y.o. female past medical history significant for elevated cholesterol, who presents to the emergency department with abdominal pain and vomiting.  Patient states that she was started on a statin yesterday by her primary care physician.  States that she took her first dose yesterday.  Since that time endorses ongoing nausea and a couple episodes of vomiting.  Abdominal pain in her upper abdomen.  States that this has happened in the past whenever she was started on this medication.  Denies any diarrhea or constipation.  Denies any significant abdominal pain at this time.  No fever or chills.  Denies any dysuria, urinary urgency or frequency.  Denies any chest pain or shortness of breath.     Physical Exam   Triage Vital Signs: ED Triage Vitals [04/12/23 1510]  Encounter Vitals Group     BP (!) 161/83     Systolic BP Percentile      Diastolic BP Percentile      Pulse Rate 80     Resp 18     Temp 98 F (36.7 C)     Temp Source Oral     SpO2 98 %     Weight      Height      Head Circumference      Peak Flow      Pain Score 0     Pain Loc      Pain Education      Exclude from Growth Chart     Most recent vital signs: Vitals:   04/12/23 1917 04/12/23 2103  BP: (!) 155/86 (!) 140/87  Pulse: 76 63  Resp: 17 17  Temp:    SpO2: 98% 96%    Physical Exam Constitutional:      Appearance: She is well-developed.  HENT:     Head: Atraumatic.  Eyes:     Conjunctiva/sclera: Conjunctivae normal.  Cardiovascular:     Rate and Rhythm: Regular rhythm.  Pulmonary:     Effort: No respiratory distress.  Abdominal:     General: There is no distension.     Tenderness: There is abdominal tenderness (Mild epigastric abdominal tenderness palpation.  Negative McBurney's  point.  Negative Murphy sign.).  Musculoskeletal:        General: Normal range of motion.     Cervical back: Normal range of motion.  Skin:    General: Skin is warm.     Capillary Refill: Capillary refill takes less than 2 seconds.  Neurological:     Mental Status: She is alert. Mental status is at baseline.     IMPRESSION / MDM / ASSESSMENT AND PLAN / ED COURSE  I reviewed the triage vital signs and the nursing notes.  Differential diagnosis including ACS, viral gastroenteritis, viral illness including COVID/influenza, gastritis/PUD, constipation, medication side effect     LABS (all labs ordered are listed, but only abnormal results are displayed) Labs interpreted as -    Labs Reviewed  COMPREHENSIVE METABOLIC PANEL - Abnormal; Notable for the following components:      Result Value   Glucose, Bld 119 (*)    BUN 24 (*)    Creatinine, Ser 1.37 (*)    GFR, Estimated 40 (*)    All other components  within normal limits  URINALYSIS, ROUTINE W REFLEX MICROSCOPIC - Abnormal; Notable for the following components:   Color, Urine STRAW (*)    APPearance CLEAR (*)    All other components within normal limits  RESP PANEL BY RT-PCR (RSV, FLU A&B, COVID)  RVPGX2  LIPASE, BLOOD  CBC  TROPONIN I (HIGH SENSITIVITY)  TROPONIN I (HIGH SENSITIVITY)     MDM    Troponin negative.  Urine with no signs of urinary tract infection.  No leukocytosis.  Creatinine at her baseline.  No significant electrolyte abnormality.  Normal LFTs.  Normal lipase.  Patient was given ODT Zofran  On reevaluation patient states she is feeling much better.  Tolerating p.o.  States that she wants to go home.  Repeat abdominal exam with no tenderness to palpation.  No rebound or guarding.  Will start the patient on low-dose PPI and give a prescription for Zofran.  Discussed close follow-up with primary care physician and discussed return precautions for any ongoing or worsening  symptoms.   PROCEDURES:  Critical Care performed: No  Procedures  Patient's presentation is most consistent with acute complicated illness / injury requiring diagnostic workup.   MEDICATIONS ORDERED IN ED: Medications  acetaminophen (TYLENOL) tablet 1,000 mg (1,000 mg Oral Given 04/12/23 1916)  ondansetron (ZOFRAN-ODT) disintegrating tablet 4 mg (4 mg Oral Given 04/12/23 1917)    FINAL CLINICAL IMPRESSION(S) / ED DIAGNOSES   Final diagnoses:  Nausea and vomiting, unspecified vomiting type     Rx / DC Orders   ED Discharge Orders          Ordered    ondansetron (ZOFRAN-ODT) 4 MG disintegrating tablet  Every 8 hours PRN        04/12/23 1853    omeprazole (PRILOSEC OTC) 20 MG tablet  Daily        04/12/23 2053             Note:  This document was prepared using Dragon voice recognition software and may include unintentional dictation errors.   Corena Herter, MD 04/12/23 2337

## 2023-04-12 NOTE — ED Notes (Signed)
..  The patient is A&OX4, ambulatory at d/c with independent steady gait, NAD. Pt verbalized understanding of d/c instructions, prescriptions and follow up care.  

## 2023-04-12 NOTE — ED Notes (Signed)
Pt given coca cola and crackers for PO challenge.

## 2023-04-12 NOTE — ED Notes (Signed)
Pt was able to tolerate the coca cola and saltine crackers without vomiting. Pt reports she is feeling better and is ready to go home. Dr. Arnoldo Morale informed.

## 2023-04-12 NOTE — ED Triage Notes (Signed)
Pt to ed from work via POV for emesis. Pt sysmptoms started last night after she started a new PO medication. Pt is unsure what the name of the meds are. Pt is caox4, in no acute distress and ambulatory in triage. Pt did go to work today at [employer] and worked her shift as normal and then came here.

## 2023-04-24 NOTE — Progress Notes (Signed)
Concern for possible viral illness -covid/influenza

## 2023-05-01 ENCOUNTER — Encounter: Payer: Self-pay | Admitting: Cardiology

## 2023-05-01 ENCOUNTER — Ambulatory Visit: Payer: Medicare HMO | Attending: Cardiology | Admitting: Cardiology

## 2023-05-01 VITALS — BP 124/72 | HR 77 | Ht 64.0 in | Wt 174.8 lb

## 2023-05-01 DIAGNOSIS — E782 Mixed hyperlipidemia: Secondary | ICD-10-CM | POA: Diagnosis not present

## 2023-05-01 DIAGNOSIS — I6523 Occlusion and stenosis of bilateral carotid arteries: Secondary | ICD-10-CM

## 2023-05-01 DIAGNOSIS — I10 Essential (primary) hypertension: Secondary | ICD-10-CM

## 2023-05-01 MED ORDER — ASPIRIN 81 MG PO TBEC: 81.0000 mg | DELAYED_RELEASE_TABLET | Freq: Every day | ORAL | 3 refills | Status: AC

## 2023-05-01 NOTE — Patient Instructions (Addendum)
 Medication Instructions:  Your physician has recommended you make the following change in your medication:   -Start Aspirin  81 mg once daily   *If you need a refill on your cardiac medications before your next appointment, please call your pharmacy*   Lab Work: None If you have labs (blood work) drawn today and your tests are completely normal, you will receive your results only by: MyChart Message (if you have MyChart) OR A paper copy in the mail If you have any lab test that is abnormal or we need to change your treatment, we will call you to review the results.   Testing/Procedures: None   Follow-Up: At [facility], you and your health needs are our priority.  As part of our continuing mission to provide you with exceptional heart care, we have created designated Provider Care Teams.  These Care Teams include your primary Cardiologist (physician) and Advanced Practice Providers (APPs -  Physician Assistants and Nurse Practitioners) who all work together to provide you with the care you need, when you need it.  We recommend signing up for the patient portal called MyChart.  Sign up information is provided on this After Visit Summary.  MyChart is used to connect with patients for Virtual Visits (Telemedicine).  Patients are able to view lab/test results, encounter notes, upcoming appointments, etc.  Non-urgent messages can be sent to your provider as well.   To learn more about what you can do with MyChart, go to [email or web address].    Your next appointment:   4 month(s)  Provider:   You may see Alvan Carrier, MD or one of the following Advanced Practice Providers on your designated Care Team:   Laymon Qua, PA-C  Olivia Pavy, [place]     Other Instructions

## 2023-05-01 NOTE — Progress Notes (Signed)
 Clinical Summary Jessica Hoover is a 78 y.o.female seen today for follow up of the following medical problems.    1. HTN - compliant with meds - home bp's typically 120s/70s    2. 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  02/2022 echo aortic sclerosis without stenosis   - denies any symptoms   2. Hyperlipidemia - compliant with meds 04/2021 TC 107 TG 69 HDL 29 LDL 64 -she reports she stopped taking statin on her own last week to see if was affecting her bp  - restarted statin, reports stomach pain, N/V on statin. Seen in ER Jan 2025 for symptoms     3. Carotid stenosis - 01/2019 mild disease bilaterally - 02/2023 carotid US : difficult evaluation, significant plaque bilaterally suggesting significant stenosis - 02/2023 CTA: LICA 60%, RICA string sign - referred to vascular, has appt 05/28/23   4. TIA - left sided numbness back in March, head to toe. Symptoms lasted about 2 hours.  - she was seen by pcp few months later - intermittent episodes since that time, last episode 3 months ago.          6. CKD  - followed by Dr Rachele     7. Aortic sclerosis 02/2022 echo aortic valve sclerosis without stenosis.    8. PAD - followed by vascular   SH: works [employer] in [employer] Past Medical History:  Diagnosis Date   Arthritis    Chronic back pain    Chronic pelvic pain in female    pelvic joint and thigh   Chronic right hip pain    Chronic shoulder pain    Frequency of urination    Heart murmur    Hypertension    Lumbago    Neuralgia and neuritis    Osteoarthritis    Pneumonia    hx of   PONV (postoperative nausea and vomiting)    Radiculitis    Sciatica    Seizures (HCC)    had seizures as a child     Allergies  Allergen Reactions   Clarithromycin Shortness Of Breath   Codeine Shortness Of Breath   Hydrocodone  Shortness Of Breath   Penicillins Shortness Of Breath and Other (See Comments)     PATIENT HAS HAD A PCN REACTION WITH IMMEDIATE RASH, FACIAL/TONGUE/THROAT SWELLING, SOB, OR LIGHTHEADEDNESS WITH HYPOTENSION:  #  #  #  YES  #  #  #   Has patient had a PCN reaction causing severe rash involving mucus membranes or skin necrosis: No Has patient had a PCN reaction that required hospitalization: No Has patient had a PCN reaction occurring within the last 10 years: No    Sulfa Antibiotics Shortness Of Breath   Dilaudid  [Hydromorphone  Hcl] Other (See Comments)    ORAL SORES   Oxycodone  Hcl Nausea Only     Current Outpatient Medications  Medication Sig Dispense Refill   amLODipine  (NORVASC ) 10 MG tablet Take 1 tablet (10 mg total) by mouth daily. 90 tablet 3   atorvastatin (LIPITOR) 40 MG tablet Take 40 mg by mouth daily.     febuxostat  (ULORIC ) 40 MG tablet One daily for gout 30 tablet 5   glipiZIDE (GLUCOTROL) 5 MG tablet Take 10 mg by mouth daily before breakfast.     losartan  (COZAAR ) 50 MG tablet Take 1 tablet (50 mg total) by mouth 2 (two) times daily.  180 tablet 3   Menthol , Topical Analgesic, (BIOFREEZE) 4 % GEL Apply 1 Application topically as needed.     omeprazole  (PRILOSEC  OTC) 20 MG tablet Take 1 tablet (20 mg total) by mouth daily. 30 tablet 0   ondansetron  (ZOFRAN -ODT) 4 MG disintegrating tablet Take 1 tablet (4 mg total) by mouth every 8 (eight) hours as needed for nausea or vomiting. 30 tablet 0   spironolactone  (ALDACTONE ) 25 MG tablet Take 1 tablet (25 mg total) by mouth daily. 90 tablet 3   No current facility-administered medications for this visit.     Past Surgical History:  Procedure Laterality Date   ABDOMINAL HYSTERECTOMY     partial   BACK SURGERY     CHOLECYSTECTOMY     COLONOSCOPY W/ POLYPECTOMY     JOINT REPLACEMENT     TOTAL HIP ARTHROPLASTY Right 10/26/2014   Procedure: TOTAL HIP ARTHROPLASTY;  Surgeon: Maude LELON Right, MD;  Location: [facility] OR;  Service:  Orthopedics;  Laterality: Right;   TOTAL HIP ARTHROPLASTY Left 10/09/2016   TOTAL HIP ARTHROPLASTY Left 10/09/2016   Procedure: LEFT TOTAL HIP ARTHROPLASTY;  Surgeon: Right Maude LELON, MD;  Location: [facility] OR;  Service: Orthopedics;  Laterality: Left;     Allergies  Allergen Reactions   Clarithromycin Shortness Of Breath   Codeine Shortness Of Breath   Hydrocodone Shortness Of Breath   Penicillins Shortness Of Breath and Other (See Comments)     PATIENT HAS HAD A PCN REACTION WITH IMMEDIATE RASH, FACIAL/TONGUE/THROAT SWELLING, SOB, OR LIGHTHEADEDNESS WITH HYPOTENSION:  #  #  #  YES  #  #  #   Has patient had a PCN reaction causing severe rash involving mucus membranes or skin necrosis: No Has patient had a PCN reaction that required hospitalization: No Has patient had a PCN reaction occurring within the last 10 years: No    Sulfa Antibiotics Shortness Of Breath   Dilaudid  [Hydromorphone  Hcl] Other (See Comments)    ORAL SORES   Oxycodone  Hcl Nausea Only      Family History  Problem Relation Age of Onset   Hypertension Mother    Depression Mother    Hypertension Father    Cancer Father      Social History Jessica Hoover reports that she has been smoking cigarettes. She started smoking about 57 years ago. She has a 14.5 pack-year smoking history. She has never used smokeless tobacco. Jessica Hoover reports no history of alcohol use.    Physical Examination Today's Vitals   05/01/23 1447  BP: 124/72  Pulse: 77  SpO2: 97%  Weight: 174 lb 12.8 oz (79.3 kg)  Height: 5' 4 (1.626 m)   Body mass index is 30 kg/m.  Gen: resting comfortably, no acute distress HEENT: no scleral icterus, pupils equal round and reactive, no palptable cervical adenopathy,  CV: RRR, 2/6 systolic murmur rusb, no jvd Resp: Clear to auscultation bilaterally GI: abdomen is soft, non-tender, non-distended, normal bowel sounds, no hepatosplenomegaly MSK: extremities are warm, no edema.  Skin: warm, no  rash Neuro:  no focal deficits Psych: appropriate affect   Diagnostic Studies  05/2015 echo Study Conclusions   - Left ventricle: The cavity size was normal. Wall thickness was   increased in a pattern of moderate LVH. Systolic function was   normal. The estimated ejection fraction was in the range of 60%   to 65%. Wall motion was normal; there were no regional wall   motion abnormalities. Doppler parameters are consistent with  abnormal left ventricular relaxation (grade 1 diastolic   dysfunction). - Aortic valve: Mildly thickened, moderately calcified leaflets.   Aortic valve sclerosis noted. Morphologically, there appears to   be at least mild stenosis. No hemodynamic parameters obtained. - Mitral valve: Mildly calcified annulus.     01/2019 echo IMPRESSIONS     1. Left ventricular ejection fraction, by visual estimation, is 60 to  65%. The left ventricle has normal function. There is mildly increased  left ventricular hypertrophy.   2. Left ventricular diastolic parameters are consistent with Grade I  diastolic dysfunction (impaired relaxation).   3. Global right ventricle has normal systolic function.The right  ventricular size is normal. No increase in right ventricular wall  thickness.   4. Left atrial size was normal.   5. Right atrial size was normal.   6. Mild aortic valve annular calcification.   7. Mild mitral annular calcification.   8. The mitral valve is grossly normal. Trace mitral valve regurgitation.   9. The tricuspid valve is grossly normal. Tricuspid valve regurgitation  is trivial.  10. The aortic valve is tricuspid. Aortic valve regurgitation is not  visualized. Mild to moderate aortic valve sclerosis/calcification without  any evidence of aortic stenosis. Limited excursion of noncoronary cusp  with upper normal transvalvular  gradients.  11. The pulmonic valve was grossly normal. Pulmonic valve regurgitation is  not visualized.  12. The  inferior vena cava is normal in size with greater than 50%  respiratory variability, suggesting right atrial pressure of 3 mmHg.      01/2019 carotid US  Right:   Color duplex indicates moderate heterogeneous and calcified plaque, with no hemodynamically significant stenosis by duplex criteria in the extracranial cerebrovascular circulation.   Left:   Heterogeneous and calcified plaque at the left carotid bifurcation with less than 50% stenosis by established duplex criteria. Note that the flow velocities of the left ICA were obtained from an area distal to the maximum narrowing due to the presence of anterior wall plaque with shadowing and may be underestimating the percentage of ICA stenosis. If establishing a more accurate degree of stenosis is required, cerebral angiogram should be considered, or as a second best test, CTA.   02/2022 echo 1. Left ventricular ejection fraction, by estimation, is 60 to 65%. The  left ventricle has normal function. The left ventricle has no regional  wall motion abnormalities. There is moderate left ventricular hypertrophy.  Left ventricular diastolic  parameters are consistent with Grade I diastolic dysfunction (impaired  relaxation).   2. Right ventricular systolic function is normal. The right ventricular  size is normal.   3. The mitral valve is abnormal. No evidence of mitral valve  regurgitation. No evidence of mitral stenosis. Mild mitral annular  calcification.   4. The aortic valve is calcified with restricted mobility of non-coronary  cusp. Aortic valve regurgitation is not visualized. No aortic stenosis is  present.   5. The inferior vena cava is normal in size with greater than 50%  respiratory variability, suggesting right atrial pressure of 3 mmHg.      Assessment and Plan  1. HTN -at goal, continue current med  2. HLD - recent adverse reaction to reinstroduction of statin, primarily GI side effects that are still ongoing   - consider zetia  in the near future, would wait for current GI issues to resolve  3. Carotid stenosis - upcoming appt with vascular        Jessica Hoover, M.D.

## 2023-05-01 NOTE — Addendum Note (Signed)
 Addended by: Casper Clement on: 05/01/2023 03:14 PM   Modules accepted: Orders

## 2023-05-21 ENCOUNTER — Ambulatory Visit: Payer: Medicare HMO | Admitting: Cardiology

## 2023-05-27 NOTE — Progress Notes (Unsigned)
 Patient name: Jessica Hoover MRN: [id number] DOB: October 24, 1945 Sex: female  REASON FOR CONSULT: Carotid artery disease  HPI: Jessica Hoover is a 78 y.o. female, with history hypertension that presents for evaluation of carotid artery disease.  She had an ultrasound on 03/08/2023 showing a large amount amount large amount of bilateral plaque left greater than right but velocities were degraded due to shadowing.  Subsequently had a CTA neck on 03/25/2023 showing 60% left and less right ICA plaque with significant right ICA siphon stenosis with radiographic string sign.  She has been seen for PAD in our practice in the past.  Past Medical History:  Diagnosis Date   Arthritis    Chronic back pain    Chronic pelvic pain in female    pelvic joint and thigh   Chronic right hip pain    Chronic shoulder pain    Frequency of urination    Heart murmur    Hypertension    Lumbago    Neuralgia and neuritis    Osteoarthritis    Pneumonia    hx of   PONV (postoperative nausea and vomiting)    Radiculitis    Sciatica    Seizures (HCC)    had seizures as a child    Past Surgical History:  Procedure Laterality Date   ABDOMINAL HYSTERECTOMY     partial   BACK SURGERY     CHOLECYSTECTOMY     COLONOSCOPY W/ POLYPECTOMY     JOINT REPLACEMENT     TOTAL HIP ARTHROPLASTY Right 10/26/2014   Procedure: TOTAL HIP ARTHROPLASTY;  Surgeon: Valeria Batman, MD;  Location: [facility] OR;  Service: Orthopedics;  Laterality: Right;   TOTAL HIP ARTHROPLASTY Left 10/09/2016   TOTAL HIP ARTHROPLASTY Left 10/09/2016   Procedure: LEFT TOTAL HIP ARTHROPLASTY;  Surgeon: Valeria Batman, MD;  Location: [facility] OR;  Service: Orthopedics;  Laterality: Left;    Family History  Problem Relation Age of Onset   Hypertension Mother    Depression Mother    Hypertension Father    Cancer Father     SOCIAL HISTORY: Social History   Socioeconomic History   Marital status: Widowed    Spouse name: Not on file   Number of  children: Not on file   Years of education: Not on file   Highest education level: Not on file  Occupational History   Not on file  Tobacco Use   Smoking status: Some Days    Current packs/day: 0.25    Average packs/day: 0.3 packs/day for 57.9 years (14.5 ttl pk-yrs)    Types: Cigarettes    Start date: 07/11/1965   Smokeless tobacco: Never   Tobacco comments:    3 per week  Vaping Use   Vaping status: Never Used  Substance and Sexual Activity   Alcohol use: No    Alcohol/week: 0.0 standard drinks of alcohol   Drug use: No   Sexual activity: Not Currently  Other Topics Concern   Not on file  Social History Narrative   Not on file   Social Drivers of Health   Financial Resource Strain: Not on file  Food Insecurity: Not on file  Transportation Needs: Not on file  Physical Activity: Not on file  Stress: Not on file  Social Connections: Unknown (08/06/2021)   Received from [facility]   Social Network    Social Network: Not on file  Intimate Partner Violence: Unknown (06/28/2021)   [facility]   HITS    Physically Hurt: Not on file    Insult or Talk Down To: Not on file    Threaten Physical Harm: Not on file    Scream or Curse: Not on file    Allergies  Allergen Reactions   Clarithromycin Shortness Of Breath   Codeine Shortness Of Breath   Hydrocodone Shortness Of Breath   Penicillins Shortness Of Breath and Other (See Comments)     PATIENT HAS HAD A PCN REACTION WITH IMMEDIATE RASH, FACIAL/TONGUE/THROAT SWELLING, SOB, OR LIGHTHEADEDNESS WITH HYPOTENSION:  #  #  #  YES  #  #  #   Has patient had a PCN reaction causing severe rash involving mucus membranes or skin necrosis: No Has patient had a PCN reaction that required hospitalization: No Has patient had a PCN reaction occurring within the last 10 years: No    Sulfa Antibiotics Shortness Of Breath   Dilaudid [Hydromorphone Hcl] Other (See Comments)    ORAL SORES    Oxycodone Hcl Nausea Only    Current Outpatient Medications  Medication Sig Dispense Refill   amLODipine (NORVASC) 10 MG tablet Take 1 tablet (10 mg total) by mouth daily. 90 tablet 3   aspirin EC 81 MG tablet Take 1 tablet (81 mg total) by mouth daily. Swallow whole. 90 tablet 3   glipiZIDE (GLUCOTROL) 5 MG tablet Take 10 mg by mouth daily before breakfast.     losartan (COZAAR) 50 MG tablet Take 1 tablet (50 mg total) by mouth 2 (two) times daily. 180 tablet 3   Menthol, Topical Analgesic, (BIOFREEZE) 4 % GEL Apply 1 Application topically as needed.     metroNIDAZOLE (FLAGYL) 500 MG tablet Take 500 mg by mouth 3 (three) times daily.     ondansetron (ZOFRAN-ODT) 4 MG disintegrating tablet Take 1 tablet (4 mg total) by mouth every 8 (eight) hours as needed for nausea or vomiting. 30 tablet 0   pioglitazone (ACTOS) 30 MG tablet Take 30 mg by mouth daily.     spironolactone (ALDACTONE) 25 MG tablet Take 1 tablet (25 mg total) by mouth daily. 90 tablet 3   No current facility-administered medications for this visit.    REVIEW OF SYSTEMS:  [X]  denotes positive finding, [ ]  denotes negative finding Cardiac  Comments:  Chest pain or chest pressure: ***   Shortness of breath upon exertion:    Short of breath when lying flat:    Irregular heart rhythm:        Vascular    Pain in calf, thigh, or hip brought on by ambulation:    Pain in feet at night that wakes you up from your sleep:     Blood clot in your veins:    Leg swelling:         Pulmonary    Oxygen at home:    Productive cough:     Wheezing:         Neurologic    Sudden weakness in arms or legs:     Sudden numbness in arms or legs:     Sudden onset of difficulty speaking or slurred speech:    Temporary loss of vision in one eye:     Problems with dizziness:         Gastrointestinal    Blood in stool:     Vomited blood:         Genitourinary    Burning when urinating:     Blood in urine:  Psychiatric    Major  depression:         Hematologic    Bleeding problems:    Problems with blood clotting too easily:        Skin    Rashes or ulcers:        Constitutional    Fever or chills:      PHYSICAL EXAM: There were no vitals filed for this visit.  GENERAL: The patient is a well-nourished female, in no acute distress. The vital signs are documented above. CARDIAC: There is a regular rate and rhythm.  VASCULAR: *** PULMONARY: There is good air exchange bilaterally without wheezing or rales. ABDOMEN: Soft and non-tender with normal pitched bowel sounds.  MUSCULOSKELETAL: There are no major deformities or cyanosis. NEUROLOGIC: No focal weakness or paresthesias are detected. SKIN: There are no ulcers or rashes noted. PSYCHIATRIC: The patient has a normal affect.  DATA:   ***  Assessment/Plan:  78 y.o. female, with history hypertension that presents for evaluation of carotid artery disease.  She had an ultrasound on 03/08/2023 showing a large amount amount large amount of bilateral plaque left greater than right but velocities were degraded due to shadowing.  Subsequently had a CTA neck on 03/25/2023 showing 60% left and less right ICA plaque with significant right ICA siphon stenosis with radiographic string sign.   Cephus Shelling, MD [facility] Office: [phone]

## 2023-05-28 ENCOUNTER — Encounter: Payer: Self-pay | Admitting: Vascular Surgery

## 2023-05-28 ENCOUNTER — Ambulatory Visit: Payer: Medicare HMO | Admitting: Vascular Surgery

## 2023-05-28 VITALS — BP 128/76 | HR 71 | Resp 20 | Ht 64.0 in | Wt 188.0 lb

## 2023-05-28 DIAGNOSIS — I6523 Occlusion and stenosis of bilateral carotid arteries: Secondary | ICD-10-CM

## 2023-05-28 DIAGNOSIS — I779 Disorder of arteries and arterioles, unspecified: Secondary | ICD-10-CM | POA: Insufficient documentation

## 2023-07-29 ENCOUNTER — Ambulatory Visit: Attending: Cardiology | Admitting: Cardiology

## 2023-07-29 ENCOUNTER — Encounter: Payer: Self-pay | Admitting: Cardiology

## 2023-07-29 VITALS — BP 132/78 | HR 75 | Ht 64.0 in | Wt 192.6 lb

## 2023-07-29 DIAGNOSIS — I10 Essential (primary) hypertension: Secondary | ICD-10-CM | POA: Diagnosis not present

## 2023-07-29 DIAGNOSIS — I6523 Occlusion and stenosis of bilateral carotid arteries: Secondary | ICD-10-CM

## 2023-07-29 DIAGNOSIS — E782 Mixed hyperlipidemia: Secondary | ICD-10-CM | POA: Diagnosis not present

## 2023-07-29 MED ORDER — EZETIMIBE 10 MG PO TABS: 10.0000 mg | ORAL_TABLET | Freq: Every day | ORAL | 2 refills | Status: AC

## 2023-07-29 NOTE — Progress Notes (Signed)
 Clinical Summary Ms. Boche is a 78 y.o.female seen today for follow up of the following medical problems.    1. HTN - she is compliant with meds - home bp's 130s/70s     2. 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  02/2022 echo aortic sclerosis without stenosis      2. Hyperlipidemia - compliant with meds 04/2021 TC 107 TG 69 HDL 29 LDL 64 -she reports she stopped taking statin on her own last week to see if was affecting her bp   - we tried restarting statin, reports stomach pain, N/V on statin. Seen in ER Jan 2025 for symptoms  - 04/2023 TC 172 TG 123 HDL 44 LDL 106     3. Carotid stenosis - 01/2019 mild disease bilaterally - 02/2023 carotid US : difficult evaluation, significant plaque bilaterally suggesting significant stenosis - 02/2023 CTA: LICA 60%, RICA string sign - referred to vascular, has appt 05/28/23   - followed by vascular         4. CKD  - followed by Dr Carrolyn Clan       [facility]: works [employer] in [place] Past Medical History:  Diagnosis Date   Arthritis    Chronic back pain    Chronic pelvic pain in female    pelvic joint and thigh   Chronic right hip pain    Chronic shoulder pain    Frequency of urination    Heart murmur    Hypertension    Lumbago    Neuralgia and neuritis    Osteoarthritis    Pneumonia    hx of   PONV (postoperative nausea and vomiting)    Radiculitis    Sciatica    Seizures (HCC)    had seizures as a child     Allergies  Allergen Reactions   Clarithromycin Shortness Of Breath   Codeine Shortness Of Breath   Hydrocodone Shortness Of Breath   Penicillins Shortness Of Breath and Other (See Comments)     PATIENT HAS HAD A PCN REACTION WITH IMMEDIATE RASH, FACIAL/TONGUE/THROAT SWELLING, SOB, OR LIGHTHEADEDNESS WITH HYPOTENSION:  #  #  #  YES  #  #  #   Has patient had a PCN reaction  causing severe rash involving mucus membranes or skin necrosis: No Has patient had a PCN reaction that required hospitalization: No Has patient had a PCN reaction occurring within the last 10 years: No    Sulfa Antibiotics Shortness Of Breath   Dilaudid  [Hydromorphone  Hcl] Other (See Comments)    ORAL SORES   Oxycodone  Hcl Nausea Only     Current Outpatient Medications  Medication Sig Dispense Refill   amLODipine  (NORVASC ) 10 MG tablet Take 1 tablet (10 mg total) by mouth daily. 90 tablet 3   aspirin  EC 81 MG tablet Take 1 tablet (81 mg total) by mouth daily. Swallow whole. 90 tablet 3   glipiZIDE (GLUCOTROL) 5 MG tablet Take 10 mg by mouth daily before breakfast.     losartan  (COZAAR ) 50 MG tablet Take 1 tablet (50 mg total) by mouth 2 (two) times daily. 180 tablet 3   Menthol , Topical Analgesic, (BIOFREEZE) 4 % GEL Apply 1 Application topically as needed.     metroNIDAZOLE  (FLAGYL ) 500 MG tablet Take 500 mg by mouth 3 (three) times daily.     ondansetron  (ZOFRAN -ODT) 4 MG disintegrating tablet Take 1 tablet (  4 mg total) by mouth every 8 (eight) hours as needed for nausea or vomiting. 30 tablet 0   pioglitazone (ACTOS) 30 MG tablet Take 30 mg by mouth daily.     spironolactone  (ALDACTONE ) 25 MG tablet Take 1 tablet (25 mg total) by mouth daily. 90 tablet 3   No current facility-administered medications for this visit.     Past Surgical History:  Procedure Laterality Date   ABDOMINAL HYSTERECTOMY     partial   BACK SURGERY     CHOLECYSTECTOMY     COLONOSCOPY W/ POLYPECTOMY     JOINT REPLACEMENT     TOTAL HIP ARTHROPLASTY Right 10/26/2014   Procedure: TOTAL HIP ARTHROPLASTY;  Surgeon: Shirlee Dotter, MD;  Location: [facility] OR;  Service: Orthopedics;  Laterality: Right;   TOTAL HIP ARTHROPLASTY Left 10/09/2016   TOTAL HIP ARTHROPLASTY Left 10/09/2016   Procedure: LEFT TOTAL HIP ARTHROPLASTY;  Surgeon: Shirlee Dotter, MD;  Location: [facility] OR;  Service: Orthopedics;  Laterality: Left;      Allergies  Allergen Reactions   Clarithromycin Shortness Of Breath   Codeine Shortness Of Breath   Hydrocodone Shortness Of Breath   Penicillins Shortness Of Breath and Other (See Comments)     PATIENT HAS HAD A PCN REACTION WITH IMMEDIATE RASH, FACIAL/TONGUE/THROAT SWELLING, SOB, OR LIGHTHEADEDNESS WITH HYPOTENSION:  #  #  #  YES  #  #  #   Has patient had a PCN reaction causing severe rash involving mucus membranes or skin necrosis: No Has patient had a PCN reaction that required hospitalization: No Has patient had a PCN reaction occurring within the last 10 years: No    Sulfa Antibiotics Shortness Of Breath   Dilaudid  [Hydromorphone  Hcl] Other (See Comments)    ORAL SORES   Oxycodone  Hcl Nausea Only      Family History  Problem Relation Age of Onset   Hypertension Mother    Depression Mother    Hypertension Father    Cancer Father      Social History Ms. Dimeglio reports that she has been smoking cigarettes. She started smoking about 58 years ago. She has a 14.5 pack-year smoking history. She has never used smokeless tobacco. Ms. Joeckel reports no history of alcohol use.      Physical Examination Today's Vitals   07/29/23 0805  BP: 132/78  Pulse: 75  SpO2: 97%  Weight: 192 lb 9.6 oz (87.4 kg)  Height: 5\' 4"  (1.626 m)   Body mass index is 33.06 kg/m.  Gen: resting comfortably, no acute distress HEENT: no scleral icterus, pupils equal round and reactive, no palptable cervical adenopathy,  CV:RRR, 2/6 systolic murmur rusb, no jvd Resp: Clear to auscultation bilaterally GI: abdomen is soft, non-tender, non-distended, normal bowel sounds, no hepatosplenomegaly MSK: extremities are warm, no edema.  Skin: warm, no rash Neuro:  no focal deficits Psych: appropriate affect   Diagnostic Studies  05/2015 echo Study Conclusions   - Left ventricle: The cavity size was normal. Wall thickness was   increased in a pattern of moderate LVH. Systolic function was    normal. The estimated ejection fraction was in the range of 60%   to 65%. Wall motion was normal; there were no regional wall   motion abnormalities. Doppler parameters are consistent with   abnormal left ventricular relaxation (grade 1 diastolic   dysfunction). - Aortic valve: Mildly thickened, moderately calcified leaflets.   Aortic valve sclerosis noted. Morphologically, there appears to   be at least mild stenosis.  No hemodynamic parameters obtained. - Mitral valve: Mildly calcified annulus.     01/2019 echo IMPRESSIONS     1. Left ventricular ejection fraction, by visual estimation, is 60 to  65%. The left ventricle has normal function. There is mildly increased  left ventricular hypertrophy.   2. Left ventricular diastolic parameters are consistent with Grade I  diastolic dysfunction (impaired relaxation).   3. Global right ventricle has normal systolic function.The right  ventricular size is normal. No increase in right ventricular wall  thickness.   4. Left atrial size was normal.   5. Right atrial size was normal.   6. Mild aortic valve annular calcification.   7. Mild mitral annular calcification.   8. The mitral valve is grossly normal. Trace mitral valve regurgitation.   9. The tricuspid valve is grossly normal. Tricuspid valve regurgitation  is trivial.  10. The aortic valve is tricuspid. Aortic valve regurgitation is not  visualized. Mild to moderate aortic valve sclerosis/calcification without  any evidence of aortic stenosis. Limited excursion of noncoronary cusp  with upper normal transvalvular  gradients.  11. The pulmonic valve was grossly normal. Pulmonic valve regurgitation is  not visualized.  12. The inferior vena cava is normal in size with greater than 50%  respiratory variability, suggesting right atrial pressure of 3 mmHg.      01/2019 carotid US  Right:   Color duplex indicates moderate heterogeneous and calcified plaque, with no  hemodynamically significant stenosis by duplex criteria in the extracranial cerebrovascular circulation.   Left:   Heterogeneous and calcified plaque at the left carotid bifurcation with less than 50% stenosis by established duplex criteria. Note that the flow velocities of the left ICA were obtained from an area distal to the maximum narrowing due to the presence of anterior wall plaque with shadowing and may be underestimating the percentage of ICA stenosis. If establishing a more accurate degree of stenosis is required, cerebral angiogram should be considered, or as a second best test, CTA.   02/2022 echo 1. Left ventricular ejection fraction, by estimation, is 60 to 65%. The  left ventricle has normal function. The left ventricle has no regional  wall motion abnormalities. There is moderate left ventricular hypertrophy.  Left ventricular diastolic  parameters are consistent with Grade I diastolic dysfunction (impaired  relaxation).   2. Right ventricular systolic function is normal. The right ventricular  size is normal.   3. The mitral valve is abnormal. No evidence of mitral valve  regurgitation. No evidence of mitral stenosis. Mild mitral annular  calcification.   4. The aortic valve is calcified with restricted mobility of non-coronary  cusp. Aortic valve regurgitation is not visualized. No aortic stenosis is  present.   5. The inferior vena cava is normal in size with greater than 50%  respiratory variability, suggesting right atrial pressure of 3 mmHg.        Assessment and Plan   1. HTN -bp is at goal, continue curren tmeds   2. HLD - intolerant to statins - start zetia 10mg  daily.    3. Carotid stenosis -continue ASA, starting zetia.  - has f/u with vasular in September   F/u 6 months  Laurann Pollock, M.D.

## 2023-07-29 NOTE — Patient Instructions (Addendum)
 Medication Instructions:  Your physician has recommended you make the following change in your medication:  Start taking Zetia 10 mg once daily  Continue taking all other medications as prescribed  Labwork: None  Testing/Procedures: None  Follow-Up: Your physician recommends that you schedule a follow-up appointment in: 6 months  Any Other Special Instructions Will Be Listed Below (If Applicable). Thank you for choosing [facility]!     If you need a refill on your cardiac medications before your next appointment, please call your pharmacy.

## 2023-09-09 ENCOUNTER — Ambulatory Visit: Payer: Medicare HMO | Admitting: Cardiology

## 2023-09-20 ENCOUNTER — Other Ambulatory Visit (HOSPITAL_COMMUNITY): Payer: Self-pay | Admitting: Nephrology

## 2023-09-20 DIAGNOSIS — K7460 Unspecified cirrhosis of liver: Secondary | ICD-10-CM

## 2023-09-23 ENCOUNTER — Encounter (INDEPENDENT_AMBULATORY_CARE_PROVIDER_SITE_OTHER): Payer: Self-pay | Admitting: *Deleted

## 2023-09-24 ENCOUNTER — Ambulatory Visit (INDEPENDENT_AMBULATORY_CARE_PROVIDER_SITE_OTHER): Admitting: Gastroenterology

## 2023-09-24 ENCOUNTER — Encounter (INDEPENDENT_AMBULATORY_CARE_PROVIDER_SITE_OTHER): Payer: Self-pay | Admitting: Gastroenterology

## 2023-09-24 VITALS — BP 114/67 | HR 71 | Temp 97.9°F | Ht 64.0 in | Wt 194.8 lb

## 2023-09-24 DIAGNOSIS — K7460 Unspecified cirrhosis of liver: Secondary | ICD-10-CM

## 2023-09-24 DIAGNOSIS — R188 Other ascites: Secondary | ICD-10-CM

## 2023-09-24 NOTE — Patient Instructions (Addendum)
 It was very nice to meet you today, as dicussed with will plan for the following :  1) labwork and ultrasound  2) - Reduce salt intake to <2 g per day - Can take Tylenol  max of 2 g per day (650 mg q8h) for pain - Avoid NSAIDs for pain - Avoid eating raw oysters/shellfish

## 2023-09-24 NOTE — Progress Notes (Signed)
 Bostyn Kunkler Faizan [place] Sorn , M.D. Gastroenterology & Hepatology [facility] Gastroenterology [address] [place], [place] [address] Primary Care Physician: Katrinka Aquas, MD [address] [place] [address]  Chief Complaint: Cirrhosis  History of Present Illness: TAHIRY Hoover is a 78 y.o. female with CKD stage III, hyperparathyroidism, diabetes, hypertension, chronic diastolic heart failure who presents for evaluation of cirrhosis  Patient had a CT scan in 2023 which suggested cirrhosis.  Patient reports she previously has never been told that she has cirrhosis although her brother has cirrhosis due to heavy alcohol use.  Patient denies using any alcohol, herbal medications and no family history of autoimmune liver disease.  Patient denies generalized itching yellowing of skin or confusion.  Main complaint remains increasing weight and abdominal girth The patient denies having any nausea, vomiting, fever, chills, hematochezia, melena, hematemesis, abdominal pain, diarrhea, jaundice, pruritus or weight loss.   Labs from 03/2023 creatinine 1.37 hemoglobin A1c 7.3 Labs from 0/24 platelet 232   Last ZHI:wnwz Last Colonoscopy:many years ago at [facility]   FHx: Brother with cirrhosis -heavy alcohol use  social: neg smoking, alcohol or illicit drug use Surgical: Hysterectomy/cholecystectomy  Past Medical History: Past Medical History:  Diagnosis Date   Arthritis    Chronic back pain    Chronic pelvic pain in female    pelvic joint and thigh   Chronic right hip pain    Chronic shoulder pain    Cirrhosis (HCC)    Frequency of urination    Heart murmur    Hypertension    Lumbago    Neuralgia and neuritis    Osteoarthritis    Pneumonia    hx of   PONV (postoperative nausea and vomiting)    Radiculitis    Sciatica    Seizures (HCC)    had seizures as a child    Past Surgical History: Past Surgical History:  Procedure Laterality Date    ABDOMINAL HYSTERECTOMY     partial   BACK SURGERY     CHOLECYSTECTOMY     COLONOSCOPY W/ POLYPECTOMY     JOINT REPLACEMENT     TOTAL HIP ARTHROPLASTY Right 10/26/2014   Procedure: TOTAL HIP ARTHROPLASTY;  Surgeon: Maude LELON Right, MD;  Location: [facility] OR;  Service: Orthopedics;  Laterality: Right;   TOTAL HIP ARTHROPLASTY Left 10/09/2016   TOTAL HIP ARTHROPLASTY Left 10/09/2016   Procedure: LEFT TOTAL HIP ARTHROPLASTY;  Surgeon: Right Maude LELON, MD;  Location: [facility] OR;  Service: Orthopedics;  Laterality: Left;    Family History: Family History  Problem Relation Age of Onset   Hypertension Mother    Depression Mother    Hypertension Father    Cancer Father     Social History: Social History   Tobacco Use  Smoking Status Some Days   Current packs/day: 0.25   Average packs/day: 0.3 packs/day for 58.2 years (14.6 ttl pk-yrs)   Types: Cigarettes   Start date: 07/11/1965  Smokeless Tobacco Never  Tobacco Comments   3 per week   Social History   Substance and Sexual Activity  Alcohol Use No   Alcohol/week: 0.0 standard drinks of alcohol   Social History   Substance and Sexual Activity  Drug Use No    Allergies: Allergies  Allergen Reactions   Clarithromycin Shortness Of Breath   Codeine Shortness Of Breath   Hydrocodone Shortness Of Breath   Penicillins Shortness Of Breath and Other (See Comments)     PATIENT HAS HAD A  PCN REACTION WITH IMMEDIATE RASH, FACIAL/TONGUE/THROAT SWELLING, SOB, OR LIGHTHEADEDNESS WITH HYPOTENSION:  #  #  #  YES  #  #  #   Has patient had a PCN reaction causing severe rash involving mucus membranes or skin necrosis: No Has patient had a PCN reaction that required hospitalization: No Has patient had a PCN reaction occurring within the last 10 years: No    Sulfa Antibiotics Shortness Of Breath   Dilaudid  [Hydromorphone  Hcl] Other (See Comments)    ORAL SORES   Oxycodone  Hcl Nausea Only    Medications: Current Outpatient Medications   Medication Sig Dispense Refill   amLODipine  (NORVASC ) 10 MG tablet Take 1 tablet (10 mg total) by mouth daily. 90 tablet 3   aspirin  EC 81 MG tablet Take 1 tablet (81 mg total) by mouth daily. Swallow whole. 90 tablet 3   cholecalciferol (VITAMIN D3) 25 MCG (1000 UNIT) tablet Take 1,000 Units by mouth daily.     ezetimibe  (ZETIA ) 10 MG tablet Take 1 tablet (10 mg total) by mouth daily. 90 tablet 2   glipiZIDE (GLUCOTROL) 5 MG tablet Take 10 mg by mouth daily before breakfast.     losartan  (COZAAR ) 50 MG tablet Take 1 tablet (50 mg total) by mouth 2 (two) times daily. 180 tablet 3   Menthol , Topical Analgesic, (BIOFREEZE) 4 % GEL Apply 1 Application topically as needed.     pioglitazone (ACTOS) 30 MG tablet Take 30 mg by mouth daily.     spironolactone  (ALDACTONE ) 25 MG tablet Take 1 tablet (25 mg total) by mouth daily. 90 tablet 3   No current facility-administered medications for this visit.    Review of Systems: GENERAL: negative for malaise, night sweats HEENT: No changes in hearing or vision, no nose bleeds or other nasal problems. NECK: Negative for lumps, goiter, pain and significant neck swelling RESPIRATORY: Negative for cough, wheezing CARDIOVASCULAR: Negative for chest pain, leg swelling, palpitations, orthopnea GI: SEE HPI MUSCULOSKELETAL: Negative for joint pain or swelling, back pain, and muscle pain. SKIN: Negative for lesions, rash HEMATOLOGY Negative for prolonged bleeding, bruising easily, and swollen nodes. ENDOCRINE: Negative for cold or heat intolerance, polyuria, polydipsia and goiter. NEURO: negative for tremor, gait imbalance, syncope and seizures. The remainder of the review of systems is noncontributory.   Physical Exam: BP 114/67   Pulse 71   Temp 97.9 F (36.6 C)   Ht 5' 4 (1.626 m)   Wt 194 lb 12.8 oz (88.4 kg)   BMI 33.44 kg/m  GENERAL: The patient is AO x3, in no acute distress. HEENT: Head is normocephalic and atraumatic. EOMI are intact. Mouth  is well hydrated and without lesions. NECK: Supple. No masses LUNGS: Clear to auscultation. No presence of rhonchi/wheezing/rales. Adequate chest expansion HEART: RRR, normal s1 and s2. ABDOMEN: Soft, nontender, no guarding, no peritoneal signs, and nondistended. BS +. No masses.  Imaging/Labs: as above     Latest Ref Rng & Units 04/12/2023    3:12 PM 03/07/2022    9:40 AM 05/11/2021   10:43 AM  CBC  WBC 4.0 - 10.5 K/uL 6.8  6.7  4.7   Hemoglobin 12.0 - 15.0 g/dL 87.0  86.6  86.8   Hematocrit 36.0 - 46.0 % 41.5  41.8  41.4   Platelets 150 - 400 K/uL 251  201  171    No results found for: IRON, TIBC, FERRITIN  I personally reviewed and interpreted the available labs, imaging and endoscopic files.  2023   Impression  and Plan:  Jessica Hoover is a 78 y.o. female with CKD stage III, hyperparathyroidism, diabetes, hypertension, chronic diastolic heart failure who presents for evaluation of cirrhosis   #Cirrhosis   Can not calculate MELD as there is no INR on file Patient had a CT scan in 2023 which suggested cirrhosis.  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 Will obtain complete abdominal ultrasound and if significant ascites is found we will get a diagnostic tap to calculate SAG and total protein to investigate etiology of ascites and cirrhosis - Low sodium diet - Diuretics ; continue Aldactone  25 mg started by nephrologist and may uptitrate and also add low-dose Lasix in future  # Esophageal varices - Last EGD none,  Platelets more than 150 and hence does not seem to have clinically significant portal hypertension  # HCC screening Will enroll patient in lifelong HCC screening protocol with ultrasound and  AFP every 6 month  Recommendation   -Viral hepatitis and autoimmune serologies, MELD labs and AFP - Schedule complete abdominal US  with elastography now and then right upper quadrant sono every 6 months - Reduce salt intake to <2 g per day - Can take Tylenol  max of 2 g per day (650 mg q8h) for pain - Avoid NSAIDs for pain - Avoid eating raw oysters/shellfish - Protein shake (Ensure or Boost) every night before going to sleep - Continue spironolactone  25 mg qday and uptitrate as tolerated - If moderate ascites is found on ultrasound will obtain diagnostic paracentesis - Given advanced age patient is not interested in screening colonoscopy at this time  All questions were answered.      Cashe Gatt Faizan Fronnie Urton, MD Gastroenterology and Hepatology [facility] Gastroenterology   This chart has been completed using [facility] Dictation software, and while attempts have been made to ensure accuracy , certain words and phrases may not be transcribed as intended

## 2023-09-25 LAB — COMPREHENSIVE METABOLIC PANEL WITH GFR
ALT: 9 IU/L (ref 0–32)
AST: 15 IU/L (ref 0–40)
Albumin: 4.3 g/dL (ref 3.8–4.8)
Alkaline Phosphatase: 83 IU/L (ref 44–121)
BUN/Creatinine Ratio: 11 — ABNORMAL LOW (ref 12–28)
BUN: 15 mg/dL (ref 8–27)
Bilirubin Total: <0.2 mg/dL (ref 0.0–1.2)
CO2: 20 mmol/L (ref 20–29)
Calcium: 10.6 mg/dL — ABNORMAL HIGH (ref 8.7–10.3)
Chloride: 105 mmol/L (ref 96–106)
Creatinine, Ser: 1.35 mg/dL — ABNORMAL HIGH (ref 0.57–1.00)
Globulin, Total: 2.8 g/dL (ref 1.5–4.5)
Glucose: 92 mg/dL (ref 70–99)
Potassium: 4.9 mmol/L (ref 3.5–5.2)
Sodium: 142 mmol/L (ref 134–144)
Total Protein: 7.1 g/dL (ref 6.0–8.5)
eGFR: 40 mL/min/{1.73_m2} — ABNORMAL LOW (ref >59)

## 2023-09-25 LAB — ANTI-SMOOTH MUSCLE ANTIBODY, IGG: Smooth Muscle Ab: 4 U (ref 0–19)

## 2023-09-25 LAB — PROTIME-INR
INR: 1 (ref 0.9–1.2)
Prothrombin Time: 10.9 s (ref 9.1–12.0)

## 2023-09-25 LAB — IRON,TIBC AND FERRITIN PANEL
Ferritin: 444 ng/mL — ABNORMAL HIGH (ref 15–150)
Iron Saturation: 26 % (ref 15–55)
Iron: 87 ug/dL (ref 27–139)
Total Iron Binding Capacity: 335 ug/dL (ref 250–450)
UIBC: 248 ug/dL (ref 118–369)

## 2023-09-25 LAB — HEPATITIS B SURFACE ANTIBODY,QUALITATIVE: Hep B Surface Ab, Qual: NONREACTIVE

## 2023-09-25 LAB — HEPATITIS B CORE ANTIBODY, TOTAL: Hep B Core Total Ab: NEGATIVE

## 2023-09-25 LAB — HIV ANTIBODY (ROUTINE TESTING W REFLEX): HIV Screen 4th Generation wRfx: NONREACTIVE

## 2023-09-25 LAB — HEPATITIS A ANTIBODY, TOTAL: hep A Total Ab: NEGATIVE

## 2023-09-25 LAB — HEPATITIS B SURFACE ANTIGEN: Hepatitis B Surface Ag: NEGATIVE

## 2023-09-25 LAB — ANA: ANA Titer 1: NEGATIVE

## 2023-09-25 LAB — HEPATITIS C ANTIBODY: Hep C Virus Ab: NONREACTIVE

## 2023-09-25 LAB — MITOCHONDRIAL ANTIBODIES: Mitochondrial Ab: <20 U (ref 0.0–20.0)

## 2023-09-25 LAB — AFP TUMOR MARKER: AFP, Serum, Tumor Marker: 3.3 ng/mL (ref 0.0–9.2)

## 2023-09-30 ENCOUNTER — Ambulatory Visit (INDEPENDENT_AMBULATORY_CARE_PROVIDER_SITE_OTHER): Payer: Self-pay | Admitting: Gastroenterology

## 2023-09-30 NOTE — Progress Notes (Signed)
 Hi Tanya ,  Can you please call the patient and tell the patient the lab work did not show anything concerning.  You are not immune against hepatitis A and B and hence because you have cirrhosis we will recommend vaccination against hepatitis A and B with your PCP  I look forward to seeing the results of your ultrasound  Thanks,  Mirenda Baltazar Faizan Bonnell Placzek, MD Gastroenterology and Hepatology [facility] Gastroenterology   ============== Ferritin 444 iron saturation 26% Normal liver enzymes AFP 3.3 INR 1.0 ANA negative AMA negative ASMA negative  Hep A nonimmune Hep B core antibody negative, Hep B surface antibody negative Hep B surface antigen negative Hep B nonimmune non-exposed Hep C negative  Pending ultrasound HIV negative

## 2023-10-03 ENCOUNTER — Ambulatory Visit (HOSPITAL_COMMUNITY)

## 2023-10-03 ENCOUNTER — Encounter (HOSPITAL_COMMUNITY): Payer: Self-pay

## 2023-10-03 ENCOUNTER — Ambulatory Visit (HOSPITAL_COMMUNITY)
Admission: RE | Admit: 2023-10-03 | Discharge: 2023-10-03 | Disposition: A | Discharge status: Home / Self Care | Source: Ambulatory Visit | Attending: Gastroenterology | Admitting: Gastroenterology

## 2023-10-03 DIAGNOSIS — K7460 Unspecified cirrhosis of liver: Secondary | ICD-10-CM | POA: Diagnosis present

## 2023-10-03 DIAGNOSIS — R188 Other ascites: Secondary | ICD-10-CM | POA: Insufficient documentation

## 2023-10-07 NOTE — Progress Notes (Signed)
  Hi Tanya ,  Can you please call the patient and tell the patient the ultrasound does confirm that you have cirrhosis (scarring of the liver), I do recommend repeat ultrasound in 6 months  Thanks,  Andora Krull Faizan Issiac Jamar, MD Gastroenterology and Hepatology [facility] Gastroenterology =======================  IMPRESSION: ULTRASOUND ABDOMEN:   Cirrhotic changes involving the liver without focal hepatic lesion.   Status post cholecystectomy.  No biliary dilatation.   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

## 2023-10-07 NOTE — Progress Notes (Signed)
6 mth [place] Noted in recall

## 2023-10-28 ENCOUNTER — Other Ambulatory Visit: Payer: Self-pay | Admitting: *Deleted

## 2023-10-28 DIAGNOSIS — I6523 Occlusion and stenosis of bilateral carotid arteries: Secondary | ICD-10-CM

## 2023-11-11 ENCOUNTER — Ambulatory Visit: Admitting: Urology

## 2023-11-21 ENCOUNTER — Other Ambulatory Visit: Payer: Self-pay | Admitting: Student

## 2023-11-26 ENCOUNTER — Ambulatory Visit (INDEPENDENT_AMBULATORY_CARE_PROVIDER_SITE_OTHER)

## 2023-11-26 ENCOUNTER — Ambulatory Visit: Admitting: Vascular Surgery

## 2023-11-26 ENCOUNTER — Encounter: Payer: Self-pay | Admitting: Vascular Surgery

## 2023-11-26 ENCOUNTER — Other Ambulatory Visit: Payer: Self-pay | Admitting: Student

## 2023-11-26 ENCOUNTER — Encounter (HOSPITAL_COMMUNITY)

## 2023-11-26 VITALS — BP 136/70 | HR 67 | Temp 98.0°F | Resp 18 | Ht 64.0 in | Wt 201.0 lb

## 2023-11-26 DIAGNOSIS — I6523 Occlusion and stenosis of bilateral carotid arteries: Secondary | ICD-10-CM

## 2023-11-26 NOTE — Progress Notes (Signed)
 Patient name: Jessica Hoover MRN: [id number] DOB: [birth year] Sex: female  REASON FOR CONSULT: 6 month follow-up, surveillance carotid artery disease  HPI: Jessica Hoover is a 78 y.o. female, with history hypertension and tobacco abuse that presents for 6 month follow-up for ongoing surveillance carotid artery disease.  She denies any recent history of stroke or TIA.  Still smoking but not inhaling.  No new concerns today.  She had an ultrasound on 03/08/2023 showing a large amount of bilateral plaque left greater than right but velocities were degraded due to shadowing.  Subsequently had a CTA neck on 03/25/2023 showing 60% left and less right ICA plaque with significant right ICA siphon stenosis with radiographic string sign.  She has been seen for PAD in our practice in the past.  Past Medical History:  Diagnosis Date   Arthritis    Chronic back pain    Chronic pelvic pain in female    pelvic joint and thigh   Chronic right hip pain    Chronic shoulder pain    Cirrhosis (HCC)    Frequency of urination    Heart murmur    Hypertension    Lumbago    Neuralgia and neuritis    Osteoarthritis    Pneumonia    hx of   PONV (postoperative nausea and vomiting)    Radiculitis    Sciatica    Seizures (HCC)    had seizures as a child    Past Surgical History:  Procedure Laterality Date   ABDOMINAL HYSTERECTOMY     partial   BACK SURGERY     CHOLECYSTECTOMY     COLONOSCOPY W/ POLYPECTOMY     JOINT REPLACEMENT     TOTAL HIP ARTHROPLASTY Right 10/26/2014   Procedure: TOTAL HIP ARTHROPLASTY;  Surgeon: Maude LELON Right, MD;  Location: [facility];  Service: Orthopedics;  Laterality: Right;   TOTAL HIP ARTHROPLASTY Left 10/09/2016   TOTAL HIP ARTHROPLASTY Left 10/09/2016   Procedure: LEFT TOTAL HIP ARTHROPLASTY;  Surgeon: Right Maude LELON, MD;  Location: [facility] OR;  Service: Orthopedics;  Laterality: Left;    Family History  Problem Relation Age of Onset   Hypertension Mother    Depression  Mother    Hypertension Father    Cancer Father     SOCIAL HISTORY: Social History   Socioeconomic History   Marital status: Widowed    Spouse name: Not on file   Number of children: Not on file   Years of education: Not on file   Highest education level: Not on file  Occupational History   Not on file  Tobacco Use   Smoking status: Some Days    Current packs/day: 0.25    Average packs/day: 0.3 packs/day for 58.4 years (14.6 ttl pk-yrs)    Types: Cigarettes    Start date: 07/11/1965   Smokeless tobacco: Never   Tobacco comments:    3 per week  Vaping Use   Vaping status: Never Used  Substance and Sexual Activity   Alcohol use: No    Alcohol/week: 0.0 standard drinks of alcohol   Drug use: No   Sexual activity: Not Currently  Other Topics Concern   Not on file  Social History Narrative   Not on file   Social Drivers of Health   Financial Resource Strain: Not on file  Food Insecurity: Not on file  Transportation Needs: Not on file  Physical Activity: Not on file  Stress: Not on file  Social Connections: Unknown (  08/06/2021)   [facility]   Social Network    Social Network: Not on file  Intimate Partner Violence: Unknown (06/28/2021)   Received from [facility]   HITS    Physically Hurt: Not on file    Insult or Talk Down To: Not on file    Threaten Physical Harm: Not on file    Scream or Curse: Not on file    Allergies  Allergen Reactions   Clarithromycin Shortness Of Breath   Codeine Shortness Of Breath   Hydrocodone Shortness Of Breath   Penicillins Shortness Of Breath and Other (See Comments)     PATIENT HAS HAD A PCN REACTION WITH IMMEDIATE RASH, FACIAL/TONGUE/THROAT SWELLING, SOB, OR LIGHTHEADEDNESS WITH HYPOTENSION:  #  #  #  YES  #  #  #   Has patient had a PCN reaction causing severe rash involving mucus membranes or skin necrosis: No Has patient had a PCN reaction that required hospitalization: No Has patient had a PCN reaction  occurring within the last 10 years: No    Sulfa Antibiotics Shortness Of Breath   Dilaudid  [Hydromorphone  Hcl] Other (See Comments)    ORAL SORES   Oxycodone  Hcl Nausea Only    Current Outpatient Medications  Medication Sig Dispense Refill   amLODipine  (NORVASC ) 10 MG tablet Take 1 tablet (10 mg total) by mouth daily. 90 tablet 3   aspirin  EC 81 MG tablet Take 1 tablet (81 mg total) by mouth daily. Swallow whole. 90 tablet 3   cholecalciferol (VITAMIN D3) 25 MCG (1000 UNIT) tablet Take 1,000 Units by mouth daily.     ezetimibe  (ZETIA ) 10 MG tablet Take 1 tablet (10 mg total) by mouth daily. 90 tablet 2   glipiZIDE (GLUCOTROL) 5 MG tablet Take 10 mg by mouth daily before breakfast.     losartan  (COZAAR ) 50 MG tablet TAKE ONE TABLET BY MOUTH TWICE DAILY 180 tablet 3   Menthol , Topical Analgesic, (BIOFREEZE) 4 % GEL Apply 1 Application topically as needed.     pioglitazone (ACTOS) 30 MG tablet Take 30 mg by mouth daily.     spironolactone  (ALDACTONE ) 25 MG tablet Take 1 tablet (25 mg total) by mouth daily. 90 tablet 3   No current facility-administered medications for this visit.    REVIEW OF SYSTEMS:  [X]  denotes positive finding, [ ]  denotes negative finding Cardiac  Comments:  Chest pain or chest pressure:    Shortness of breath upon exertion:    Short of breath when lying flat:    Irregular heart rhythm:        Vascular    Pain in calf, thigh, or hip brought on by ambulation:    Pain in feet at night that wakes you up from your sleep:     Blood clot in your veins:    Leg swelling:         Pulmonary    Oxygen at home:    Productive cough:     Wheezing:         Neurologic    Sudden weakness in arms or legs:     Sudden numbness in arms or legs:     Sudden onset of difficulty speaking or slurred speech:    Temporary loss of vision in one eye:     Problems with dizziness:         Gastrointestinal    Blood in stool:     Vomited blood:  Genitourinary    Burning  when urinating:     Blood in urine:        Psychiatric    Major depression:         Hematologic    Bleeding problems:    Problems with blood clotting too easily:        Skin    Rashes or ulcers:        Constitutional    Fever or chills:      PHYSICAL EXAM: There were no vitals filed for this visit.  GENERAL: The patient is a well-nourished female, in no acute distress. The vital signs are documented above. CARDIAC: There is a regular rate and rhythm.  VASCULAR:  No prior neck incisions PULMONARY: No respiratory distress ABDOMEN: Soft and non-tender. MUSCULOSKELETAL: There are no major deformities or cyanosis.  Cranial nerves II through XII grossly intact. NEUROLOGIC: No focal weakness or paresthesias are detected. SKIN: There are no ulcers or rashes noted. PSYCHIATRIC: The patient has a normal affect.  DATA:   Carotid duplex today shows 1 to 39% stenosis bilaterally but there is calcific plaque on the left with shadowing that limits evaluation  CTA neck reviewed 03/25/2023 with approximate 60% left ICA stenosis in the extracranial carotid artery and less right ICA plaque.  ICA siphon stenosis on the right.  Assessment/Plan:  78 y.o. female, with history hypertension and tobacco abuse that presents for 37-month follow-up for surveillance carotid artery disease.  She had an ultrasound on 03/08/2023 showing a large amount of bilateral plaque left greater than right but velocities were degraded due to shadowing.  Subsequently had a CTA neck on 03/25/2023 showing 60% left and less right ICA plaque with significant right ICA siphon stenosis with radiographic string sign.   Discussed that duplex today does not show any progression to high-grade stenosis.  I did discuss for asymptomatic carotid disease recommend surgery when it is above 80%.  At best she has a 60% left ICA stenosis as noted on prior CT.  Limited evaluation today with calcific plaque and duplex showed 1-39% stenosis  bilaterally. I discussed for the siphon stenosis noted on CTA this is intracranial and we would not pursue surgical intervention and this is medical management.  She is on aspirin  but cannot tolerate statin.   Will arrange follow-up in 1 year with repeat carotid duplex.  Lonni DOROTHA Gaskins, MD [facility] Office: [phone]

## 2023-12-16 NOTE — Progress Notes (Unsigned)
 Chief Complaint: Blood in urine  History of Present Illness:  Jessica Hoover is a 78 y.o. female who is seen in consultation from Katrinka Aquas, MD for evaluation of microscopic hematuria.  Patient denies any history of gross hematuria.  She does have chronic kidney disease followed by Dr. Rachele here in [place].  She denies frequent urination but does have some urgency and rare urgency incontinence.  Distant history of kidney stones, no symptoms recently.  She rarely smokes.  She states she does not inhale.   Past Medical History:  Past Medical History:  Diagnosis Date   Arthritis    Chronic back pain    Chronic pelvic pain in female    pelvic joint and thigh   Chronic right hip pain    Chronic shoulder pain    Cirrhosis (HCC)    Frequency of urination    Heart murmur    Hypertension    Lumbago    Neuralgia and neuritis    Osteoarthritis    Pneumonia    hx of   PONV (postoperative nausea and vomiting)    Radiculitis    Sciatica    Seizures (HCC)    had seizures as a child    Past Surgical History:  Past Surgical History:  Procedure Laterality Date   ABDOMINAL HYSTERECTOMY     partial   BACK SURGERY     CHOLECYSTECTOMY     COLONOSCOPY W/ POLYPECTOMY     JOINT REPLACEMENT     TOTAL HIP ARTHROPLASTY Right 10/26/2014   Procedure: TOTAL HIP ARTHROPLASTY;  Surgeon: Maude LELON Right, MD;  Location: [facility];  Service: Orthopedics;  Laterality: Right;   TOTAL HIP ARTHROPLASTY Left 10/09/2016   TOTAL HIP ARTHROPLASTY Left 10/09/2016   Procedure: LEFT TOTAL HIP ARTHROPLASTY;  Surgeon: Right Maude LELON, MD;  Location: [facility];  Service: Orthopedics;  Laterality: Left;    Allergies:  Allergies  Allergen Reactions   Clarithromycin Shortness Of Breath   Codeine Shortness Of Breath   Hydrocodone Shortness Of Breath   Penicillins Shortness Of Breath and Other (See Comments)     PATIENT HAS HAD A PCN REACTION WITH IMMEDIATE RASH, FACIAL/TONGUE/THROAT SWELLING, SOB, OR  LIGHTHEADEDNESS WITH HYPOTENSION:  #  #  #  YES  #  #  #   Has patient had a PCN reaction causing severe rash involving mucus membranes or skin necrosis: No Has patient had a PCN reaction that required hospitalization: No Has patient had a PCN reaction occurring within the last 10 years: No    Sulfa Antibiotics Shortness Of Breath   Dilaudid  [Hydromorphone  Hcl] Other (See Comments)    ORAL SORES   Oxycodone  Hcl Nausea Only    Family History:  Family History  Problem Relation Age of Onset   Hypertension Mother    Depression Mother    Hypertension Father    Cancer Father     Social History:  Social History   Tobacco Use   Smoking status: Some Days    Current packs/day: 0.25    Average packs/day: 0.3 packs/day for 58.4 years (14.6 ttl pk-yrs)    Types: Cigarettes    Start date: 07/11/1965   Smokeless tobacco: Never   Tobacco comments:    3 per week  Vaping Use   Vaping status: Never Used  Substance Use Topics   Alcohol use: No    Alcohol/week: 0.0 standard drinks of alcohol   Drug use: No    Review of symptoms:  Constitutional:  Negative for unexplained weight loss, night sweats, fever, chills ENT:  Negative for nose bleeds, sinus pain, painful swallowing CV:  Negative for chest pain, shortness of breath, exercise intolerance, palpitations, loss of consciousness Resp:  Negative for cough, wheezing, shortness of breath GI:  Negative for nausea, vomiting, diarrhea, bloody stools GU:  Positives noted in HPI; otherwise negative for gross hematuria, dysuria, urinary incontinence Neuro:  Negative for seizures, poor balance, limb weakness, slurred speech Psych:  Negative for lack of energy, depression, anxiety Endocrine:  Negative for polydipsia, polyuria, symptoms of hypoglycemia (dizziness, hunger, sweating) Hematologic:  Negative for anemia, purpura, petechia, prolonged or excessive bleeding, use of anticoagulants  Allergic:  Negative for difficulty breathing or choking as  a result of exposure to anything; no shellfish allergy; no allergic response (rash/itch) to materials, foods  Physical exam: There were no vitals taken for this visit. GENERAL APPEARANCE:  Well appearing, well developed, well nourished, NAD HEENT: Atraumatic, Normocephalic, oropharynx clear. NECK: Supple without lymphadenopathy or thyromegaly. LUNGS: Clear to auscultation bilaterally. HEART: Regular Rate  EXTREMITIES: Moves all extremities well.  Without clubbing, cyanosis, or edema. NEUROLOGIC:  Alert and oriented x 3, normal gait, CN II-XII grossly intact.  MENTAL STATUS:  Appropriate. BACK:  Non-tender to palpation.  No CVAT SKIN:  Warm, dry and intact.    Results:  I have reviewed prior patient's records  I have reviewed referring/prior physicians records--5 pages of records from [place] [facility] reviewed.  No urinalysis results.  I have reviewed urinalysis today--no microscopic hematuria  I reviewed prior imaging studies--prior renal ultrasound and CT scan results reviewed.  No renal or bladder abnormalities.  Assessment: History of microscopic hematuria.  I see no old records stating that she has this.  Urinalysis today is clear   Plan: 1.  We will send to [facility] family practice for her prior urinalysis  2.  At this point, I do not think we have to proceed with the evaluation.  She does have normal-appearing upper tracts 2 years ago.  I will have her drop by to do another urinalysis in 1 month.  If persistent normal urine, she will return as needed

## 2023-12-17 ENCOUNTER — Encounter: Payer: Self-pay | Admitting: Urology

## 2023-12-17 ENCOUNTER — Ambulatory Visit: Admitting: Urology

## 2023-12-17 VITALS — BP 133/76 | HR 77

## 2023-12-17 DIAGNOSIS — Z87448 Personal history of other diseases of urinary system: Secondary | ICD-10-CM

## 2023-12-17 DIAGNOSIS — R3915 Urgency of urination: Secondary | ICD-10-CM

## 2023-12-17 DIAGNOSIS — R3129 Other microscopic hematuria: Secondary | ICD-10-CM

## 2023-12-17 LAB — URINALYSIS, ROUTINE W REFLEX MICROSCOPIC
Bilirubin, UA: NEGATIVE
Glucose, UA: NEGATIVE
Ketones, UA: NEGATIVE
Leukocytes,UA: NEGATIVE
Nitrite, UA: NEGATIVE
Protein,UA: NEGATIVE
RBC, UA: NEGATIVE
Specific Gravity, UA: 1.02 (ref 1.005–1.030)
Urobilinogen, Ur: 0.2 mg/dL (ref 0.2–1.0)
pH, UA: 6 (ref 5.0–7.5)

## 2023-12-23 ENCOUNTER — Ambulatory Visit: Admitting: Urology

## 2023-12-23 ENCOUNTER — Other Ambulatory Visit: Payer: Self-pay | Admitting: Student

## 2023-12-26 ENCOUNTER — Ambulatory Visit (INDEPENDENT_AMBULATORY_CARE_PROVIDER_SITE_OTHER): Admitting: Gastroenterology

## 2023-12-26 ENCOUNTER — Encounter (INDEPENDENT_AMBULATORY_CARE_PROVIDER_SITE_OTHER): Payer: Self-pay | Admitting: Gastroenterology

## 2023-12-26 VITALS — BP 124/65 | HR 76 | Temp 98.0°F | Ht 64.0 in | Wt 199.6 lb

## 2023-12-26 DIAGNOSIS — K7460 Unspecified cirrhosis of liver: Secondary | ICD-10-CM | POA: Diagnosis not present

## 2023-12-26 NOTE — Progress Notes (Addendum)
 Referring Provider: Katrinka Aquas, MD Primary Care Physician:  Katrinka Aquas, MD Primary GI Physician: Dr. Cinderella  Chief Complaint  Patient presents with   Follow-up    Pt arrives for follow up. Pt states doing ok. No issues/concerns at this time.    HPI:   Jessica Hoover is a 78 y.o. 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 Cinderella, at that time, CT scan in 2023 suggestive of cirrhosis. Patient reported no known history of cirrhosis, no ETOH abuse or family history of liver disease beyond her brother whose was ETOH induced.   Recommended to obtain viral and Autoimmune serologies, abd US , low sodium diet, continue spironolactone  25mg  daily, pt declined further colonoscopy given age  US  complete w elastography 10/03/23   Cirrhotic changes involving the liver without focal hepatic lesion. Status post cholecystectomy.  No biliary dilatation. Median kPa:  14.9  Labs done 09/24/23 Ferritin 444 iron saturation 26% Normal liver enzymes AFP 3.3 INR 1.0 ANA negative AMA negative ASMA negative Hep A nonimmune Hep B core antibody negative, Hep B surface antibody negative Hep B surface antigen negative Hep B nonimmune non-exposed Hep C negative HIV negative  AFP  3.3 INR 1 Creat 1.35, albumin 4.3 T bili <0.2 alk phos 83 AST 15 ALT 9  Plt count in January 2025 was 251k  Present: She states she has had a lot of fluid buildup, gaining 1-2 pounds per week which began in may. She saw her PCP and her glipizide was stopped. She is currently on lasix 20mg  daily and spironolactone  25mg  daily. She notes that she had fluid in her abdomen and her lower legs which seems improved. She denies any episodes of confusion or forgetfulness, jaundice or pruritus. Reports appetite is not great but she has never  been a big eater. No overt weight loss. She denies any rectal bleeding or melena. She reports she had some labs done with PCP recently, has upcoming labs with nephrology on 10/7 as well.   Previous MELD 3.0: July 2025 11  Cirrhosis related questions: Episodes of confusion/disorientation:  none  Taking diuretics? Lasix 20mg /spironolactone  25mg  daily  Beta blockers? none Prior history of variceal banding? No  Prior episodes of SBP? No  Last liver imaging: as above, Elastography in July 2025 with Kpa 13.9, no liver lesions Last AFP: July 2025 3.3 Last EGD for EV screening: none  Alcohol use: none    Last ZHI:wnwz Last Colonoscopy:many years ago at [facility]   12/26/23 1132  Weight: 199 lb 9.6 oz (90.5 kg)     Past Medical History:  Diagnosis Date   Arthritis    Chronic back pain    Chronic pelvic pain in female    pelvic joint and thigh   Chronic right hip pain    Chronic shoulder pain    Cirrhosis (HCC)    Frequency of urination    Heart murmur    Hypertension    Lumbago    Neuralgia and neuritis    Osteoarthritis    Pneumonia    hx of   PONV (postoperative nausea and vomiting)    Radiculitis    Sciatica    Seizures (HCC)    had seizures as a child    Past Surgical History:  Procedure Laterality Date   ABDOMINAL  HYSTERECTOMY     partial   BACK SURGERY     CHOLECYSTECTOMY     COLONOSCOPY W/ POLYPECTOMY     JOINT REPLACEMENT     TOTAL HIP ARTHROPLASTY Right 10/26/2014   Procedure: TOTAL HIP ARTHROPLASTY;  Surgeon: Maude LELON Right, MD;  Location: [facility];  Service: Orthopedics;  Laterality: Right;   TOTAL HIP ARTHROPLASTY Left 10/09/2016   TOTAL HIP ARTHROPLASTY Left 10/09/2016   Procedure: LEFT TOTAL HIP ARTHROPLASTY;  Surgeon: Right Maude LELON, MD;  Location: [facility] OR;  Service: Orthopedics;  Laterality: Left;    Current Outpatient Medications  Medication Sig Dispense Refill   amLODipine  (NORVASC ) 10 MG tablet TAKE ONE TABLET BY MOUTH EVERY  MORNING 90 tablet 3   aspirin  EC 81 MG tablet Take 1 tablet (81 mg total) by mouth daily. Swallow whole. 90 tablet 3   cholecalciferol (VITAMIN D3) 25 MCG (1000 UNIT) tablet Take 1,000 Units by mouth daily.     ezetimibe  (ZETIA ) 10 MG tablet Take 1 tablet (10 mg total) by mouth daily. 90 tablet 2   furosemide (LASIX) 20 MG tablet Take 20 mg by mouth daily.     losartan  (COZAAR ) 50 MG tablet TAKE ONE TABLET BY MOUTH TWICE DAILY 180 tablet 3   Menthol , Topical Analgesic, (BIOFREEZE) 4 % GEL Apply 1 Application topically as needed.     pioglitazone (ACTOS) 30 MG tablet Take 30 mg by mouth daily.     RYBELSUS 3 MG TABS start with 1 tablet onca day for 1 month then change to 7mg  tabler Orally; Duration: 30 days     spironolactone  (ALDACTONE ) 25 MG tablet TAKE ONE TABLET BY MOUTH EVERY MORNING 90 tablet 3   No current facility-administered medications for this visit.    Allergies as of 12/26/2023 - Review Complete 12/26/2023  Allergen Reaction Noted   Clarithromycin Shortness Of Breath 01/25/2012   Codeine Shortness Of Breath 01/25/2012   Hydrocodone Shortness Of Breath 01/25/2012   Penicillins Shortness Of Breath and Other (See Comments) 01/25/2012   Sulfa antibiotics Shortness Of Breath 01/25/2012   Dilaudid  [hydromorphone  hcl] Other (See Comments) 11/04/2014   Oxycodone  hcl Nausea Only 02/03/2012    Social History   Socioeconomic History   Marital status: Widowed    Spouse name: Not on file   Number of children: Not on file   Years of education: Not on file   Highest education level: Not on file  Occupational History   Not on file  Tobacco Use   Smoking status: Some Days    Current packs/day: 0.25    Average packs/day: 0.3 packs/day for 58.5 years (14.6 ttl pk-yrs)    Types: Cigarettes    Start date: 07/11/1965   Smokeless tobacco: Never   Tobacco comments:    3 per week  Vaping Use   Vaping status: Never Used  Substance and Sexual Activity   Alcohol use: No     Alcohol/week: 0.0 standard drinks of alcohol   Drug use: No   Sexual activity: Not Currently  Other Topics Concern   Not on file  Social History Narrative   Not on file   Social Drivers of Health   Financial Resource Strain: Not on file  Food Insecurity: Not on file  Transportation Needs: Not on file  Physical Activity: Not on file  Stress: Not on file  Social Connections: Unknown (08/06/2021)   Received from [facility]   Social Network    Social Network: Not on file    Review  of systems General: negative for malaise, night sweats, fever, chills, weight loss Neck: Negative for lumps, goiter, pain and significant neck swelling Resp: Negative for cough, wheezing, dyspnea at rest CV: Negative for chest pain, leg swelling, palpitations, orthopnea GI: denies melena, hematochezia, nausea, vomiting, diarrhea, constipation, dysphagia, odyonophagia, early satiety or unintentional weight loss.  MSK: Negative for joint pain or swelling, back pain, and muscle pain. Derm: Negative for itching or rash Psych: Denies depression, anxiety, memory loss, confusion. No homicidal or suicidal ideation.  Heme: Negative for prolonged bleeding, bruising easily, and swollen nodes. Endocrine: Negative for cold or heat intolerance, polyuria, polydipsia and goiter. Neuro: negative for tremor, gait imbalance, syncope and seizures. The remainder of the review of systems is noncontributory.  Physical Exam: BP 124/65   Pulse 76   Temp 98 F (36.7 C)   Ht 5' 4 (1.626 m)   Wt 199 lb 9.6 oz (90.5 kg)   BMI 34.26 kg/m  General:   Alert and oriented. No distress noted. Pleasant and cooperative.  Head:  Normocephalic and atraumatic. Eyes:  Conjuctiva clear without scleral icterus. Mouth:  Oral mucosa pink and moist. Good dentition. No lesions. Heart: Normal rate and rhythm, s1 and s2 heart sounds present.  Lungs: Clear lung sounds in all lobes. Respirations equal and unlabored. Abdomen:  +BS, soft,  non-tender and non-distended. No rebound or guarding. No HSM or masses noted. Derm: No palmar erythema or jaundice Msk:  Symmetrical without gross deformities. Normal posture. Extremities:  Without edema. Neurologic:  Alert and  oriented x4 Psych:  Alert and cooperative. Normal mood and affect.  Invalid input(s): 6 MONTHS   ASSESSMENT: Jessica Hoover is a 78 y.o. female presenting today for follow up of cirrhosis   Cirrhosis: likely secondary to MASLD. Serologies and viral testing as above was unremarkable. At this time cirrhosis appears well compensated. No signs of HE. Most recent MELD 3.0 in July is 11. She is up to date on [facility] screening. Does endorse more swelling recently though no ascites or LE edema on exam today, seems well controlled on spironolactone  25mg /lasix 20mg . She has upcoming labs with Nephrology, I will see if we can obtain these after they are drawn next week to evaluate for any thrombocytopenia. I did discuss with the patient that if platelet count is below 150k, we will need to pursue EGD for EV screening.    PLAN:  -will obtain upcoming labs from nephrology to evaluate for thrombocytopenia/need for EV screening -proceed with EGD for EV screening if plt count <150k -US  for [facility] screening due January -will update MELD labs, AFP in January -continue spironolactone  25mg  daily/lasix 20mg  daily (managed by PCP) -Reduce salt intake to <2 g per day - Can take Tylenol  max of 2 g per day (650 mg q8h) for pain - Avoid NSAIDs for pain - Avoid eating raw oysters/shellfish - protein shake every night before going to sleep to maintain adequate protein levels   All questions were answered, patient verbalized understanding and is in agreement with plan as outlined above.   Follow Up: January   Korinne Greenstein L. Mariette, MSN, APRN, AGNP-C Adult-Gerontology Nurse Practitioner [facility]  **addendum: I reviewed labs done with nephrology on 10/5 with platelet  count of 287k, no indication for EGD for EV screening at this time given plt count >150k.

## 2023-12-26 NOTE — Patient Instructions (Signed)
-  I will reach out to Dr. Rachele to see if he can forward labs to me so I can evaluate your platelet count once these are done -you will be due for Ultrasound of the liver and blood work again in january -continue spironolactone  25mg  daily, lasix 20mg  daily as these seem to be keeping your fluid retention well controlled  - Reduce salt intake to <2 g per day - Can take Tylenol  max of 2 g per day (650 mg q8h) for pain - Avoid NSAIDs for pain - Avoid eating raw oysters/shellfish - Ensure/boost protein every night before going to sleep to maintain adequate protein levels  Follow up January   It was a pleasure to see you today. I want to create trusting relationships with patients and provide genuine, compassionate, and quality care. I truly value your feedback! please be on the lookout for a survey regarding your visit with me today. I appreciate your input about our visit and your time in completing this!    Abdel Effinger L. Kamrynn Melott, MSN, APRN, AGNP-C Adult-Gerontology Nurse Practitioner [facility] at [facility]

## 2023-12-29 ENCOUNTER — Other Ambulatory Visit: Payer: Self-pay

## 2023-12-29 ENCOUNTER — Emergency Department (HOSPITAL_COMMUNITY): Admission: EM | Admit: 2023-12-29 | Discharge: 2023-12-29 | Disposition: A | Discharge status: Home / Self Care

## 2023-12-29 ENCOUNTER — Emergency Department (HOSPITAL_COMMUNITY)

## 2023-12-29 DIAGNOSIS — R1013 Epigastric pain: Secondary | ICD-10-CM | POA: Diagnosis present

## 2023-12-29 DIAGNOSIS — Z7982 Long term (current) use of aspirin: Secondary | ICD-10-CM | POA: Insufficient documentation

## 2023-12-29 DIAGNOSIS — K5792 Diverticulitis of intestine, part unspecified, without perforation or abscess without bleeding: Secondary | ICD-10-CM

## 2023-12-29 DIAGNOSIS — K5732 Diverticulitis of large intestine without perforation or abscess without bleeding: Secondary | ICD-10-CM | POA: Insufficient documentation

## 2023-12-29 LAB — CBC
HCT: 41.3 % (ref 36.0–46.0)
Hemoglobin: 12.9 g/dL (ref 12.0–15.0)
MCH: 28.4 pg (ref 26.0–34.0)
MCHC: 31.2 g/dL (ref 30.0–36.0)
MCV: 91 fL (ref 80.0–100.0)
Platelets: 287 K/uL (ref 150–400)
RBC: 4.54 MIL/uL (ref 3.87–5.11)
RDW: 13.2 % (ref 11.5–15.5)
WBC: 6.8 K/uL (ref 4.0–10.5)
nRBC: 0 % (ref 0.0–0.2)

## 2023-12-29 LAB — URINALYSIS, ROUTINE W REFLEX MICROSCOPIC
Bilirubin Urine: NEGATIVE
Glucose, UA: NEGATIVE mg/dL
Hgb urine dipstick: NEGATIVE
Ketones, ur: NEGATIVE mg/dL
Leukocytes,Ua: NEGATIVE
Nitrite: NEGATIVE
Protein, ur: NEGATIVE mg/dL
Specific Gravity, Urine: 1.045 — ABNORMAL HIGH (ref 1.005–1.030)
pH: 5 (ref 5.0–8.0)

## 2023-12-29 LAB — COMPREHENSIVE METABOLIC PANEL WITH GFR
ALT: 10 U/L (ref 0–44)
AST: 17 U/L (ref 15–41)
Albumin: 4.7 g/dL (ref 3.5–5.0)
Alkaline Phosphatase: 100 U/L (ref 38–126)
Anion gap: 13 (ref 5–15)
BUN: 18 mg/dL (ref 8–23)
CO2: 26 mmol/L (ref 22–32)
Calcium: 10.3 mg/dL (ref 8.9–10.3)
Chloride: 100 mmol/L (ref 98–111)
Creatinine, Ser: 1.41 mg/dL — ABNORMAL HIGH (ref 0.44–1.00)
GFR, Estimated: 38 mL/min — ABNORMAL LOW (ref >60)
Glucose, Bld: 130 mg/dL — ABNORMAL HIGH (ref 70–99)
Potassium: 3.8 mmol/L (ref 3.5–5.1)
Sodium: 139 mmol/L (ref 135–145)
Total Bilirubin: 0.4 mg/dL (ref 0.0–1.2)
Total Protein: 8.1 g/dL (ref 6.5–8.1)

## 2023-12-29 LAB — LIPASE, BLOOD: Lipase: 16 U/L (ref 11–51)

## 2023-12-29 MED ORDER — METRONIDAZOLE 500 MG PO TABS: 500.0000 mg | ORAL_TABLET | Freq: Two times a day (BID) | ORAL | 0 refills | Status: AC

## 2023-12-29 MED ORDER — IOHEXOL 300 MG/ML  SOLN
80.0000 mL | Freq: Once | INTRAMUSCULAR | Status: AC | PRN
  Administered 2023-12-29: 80 mL via INTRAVENOUS

## 2023-12-29 MED ORDER — METRONIDAZOLE 500 MG PO TABS
500.0000 mg | ORAL_TABLET | Freq: Once | ORAL | Status: AC
  Administered 2023-12-29: 500 mg via ORAL
  Filled 2023-12-29: qty 1

## 2023-12-29 MED ORDER — ONDANSETRON HCL 4 MG/2ML IJ SOLN
4.0000 mg | Freq: Once | INTRAMUSCULAR | Status: AC
  Administered 2023-12-29: 4 mg via INTRAVENOUS
  Filled 2023-12-29: qty 2

## 2023-12-29 MED ORDER — MORPHINE SULFATE (PF) 4 MG/ML IV SOLN
4.0000 mg | Freq: Once | INTRAVENOUS | Status: DC
  Filled 2023-12-29: qty 1

## 2023-12-29 MED ORDER — CIPROFLOXACIN HCL 250 MG PO TABS
500.0000 mg | ORAL_TABLET | Freq: Once | ORAL | Status: AC
  Administered 2023-12-29: 500 mg via ORAL
  Filled 2023-12-29: qty 2

## 2023-12-29 MED ORDER — FENTANYL CITRATE (PF) 100 MCG/2ML IJ SOLN
50.0000 ug | Freq: Once | INTRAMUSCULAR | Status: AC
  Administered 2023-12-29: 50 ug via INTRAVENOUS
  Filled 2023-12-29: qty 2

## 2023-12-29 MED ORDER — CIPROFLOXACIN HCL 500 MG PO TABS: 500.0000 mg | ORAL_TABLET | Freq: Two times a day (BID) | ORAL | 0 refills | Status: AC

## 2023-12-29 NOTE — ED Triage Notes (Signed)
 Pt c/o a knot and pain in her abd. Nurse unable to palpate a knot.

## 2023-12-29 NOTE — Discharge Instructions (Signed)
 Take all antibiotics as directed.  Follow-up closely with your gastroenterologist on an outpatient basis.  Return to emergency department immediately for any new or worsening symptoms.

## 2023-12-29 NOTE — ED Provider Notes (Signed)
 [place] EMERGENCY DEPARTMENT AT [facility] Provider Note   CSN: [id number] Arrival date & time: 12/29/23  9090     Patient presents with: Abdominal Pain   Jessica Hoover is a 78 y.o. female.   Patient is a 78 year old female who presents to the emergency department with a chief complaint of pain to the left side of the abdomen and epigastric region.  She notes that she has been is experiencing chronic abdominal pain for approximate the past 2 years but became worse since this morning.  Patient notes that the pain seems to be worse when her bladder fills up.  She does admit to some hematuria.  She has had no associated vomiting but does admit to associated nausea.  She denies any constipation or diarrhea.  She has had no associated fever or chills.  She denies any chest pain or shortness of breath.  She notes that she is currently being followed by gastroenterology.  Per their notes it appears that they are currently working her up for possible cirrhosis.   Abdominal Pain      Prior to Admission medications   Medication Sig Start Date End Date Taking? Authorizing Provider  amLODipine  (NORVASC ) 10 MG tablet TAKE ONE TABLET BY MOUTH EVERY MORNING 12/23/23   Alvan Dorn FALCON, MD  aspirin  EC 81 MG tablet Take 1 tablet (81 mg total) by mouth daily. Swallow whole. 05/01/23   Alvan Dorn FALCON, MD  cholecalciferol (VITAMIN D3) 25 MCG (1000 UNIT) tablet Take 1,000 Units by mouth daily.    [provider]  ezetimibe  (ZETIA ) 10 MG tablet Take 1 tablet (10 mg total) by mouth daily. 07/29/23   Alvan Dorn FALCON, MD  furosemide (LASIX) 20 MG tablet Take 20 mg by mouth daily. 12/23/23   [provider]  losartan  (COZAAR ) 50 MG tablet TAKE ONE TABLET BY MOUTH TWICE DAILY 11/21/23   Alvan Dorn FALCON, MD  Menthol , Topical Analgesic, (BIOFREEZE) 4 % GEL Apply 1 Application topically as needed.    [provider]  pioglitazone (ACTOS) 30 MG tablet Take 30 mg by mouth  daily. 04/08/23   [provider]  RYBELSUS 3 MG TABS start with 1 tablet onca day for 1 month then change to 7mg  tabler Orally; Duration: 30 days 12/23/23   [provider]  spironolactone  (ALDACTONE ) 25 MG tablet TAKE ONE TABLET BY MOUTH EVERY MORNING 11/26/23   Alvan Dorn FALCON, MD    Allergies: Clarithromycin, Codeine, Hydrocodone, Penicillins, Sulfa antibiotics, Dilaudid  [hydromorphone  hcl], and Oxycodone  hcl    Review of Systems  Gastrointestinal:  Positive for abdominal pain.  All other systems reviewed and are negative.   Updated Vital Signs BP (!) 152/82   Pulse 81   Temp 97.8 F (36.6 C) (Oral)   Resp 17   Ht 5' 4 (1.626 m)   Wt 90.7 kg   SpO2 99%   BMI 34.33 kg/m   Physical Exam Vitals and nursing note reviewed.  Constitutional:      General: She is not in acute distress.    Appearance: Normal appearance. She is not ill-appearing.  HENT:     Head: Normocephalic and atraumatic.     Nose: Nose normal.     Mouth/Throat:     Mouth: Mucous membranes are moist.  Eyes:     Extraocular Movements: Extraocular movements intact.     Conjunctiva/sclera: Conjunctivae normal.     Pupils: Pupils are equal, round, and reactive to light.  Cardiovascular:  Rate and Rhythm: Normal rate and regular rhythm.     Pulses: Normal pulses.     Heart sounds: Normal heart sounds. No murmur heard.    No gallop.  Pulmonary:     Effort: Pulmonary effort is normal. No respiratory distress.     Breath sounds: Normal breath sounds. No stridor. No wheezing, rhonchi or rales.  Abdominal:     General: Abdomen is flat. Bowel sounds are normal. There is no distension.     Palpations: Abdomen is soft.     Tenderness: There is abdominal tenderness in the epigastric area, left upper quadrant and left lower quadrant.     Hernia: No hernia is present.  Musculoskeletal:        General: Normal range of motion.     Cervical back: Normal range of motion and neck supple.  Skin:     General: Skin is warm and dry.     Findings: No erythema or rash.  Neurological:     General: No focal deficit present.     Mental Status: She is alert and oriented to person, place, and time. Mental status is at baseline.     Cranial Nerves: No cranial nerve deficit.     Motor: No weakness.  Psychiatric:        Mood and Affect: Mood normal.        Behavior: Behavior normal.        Thought Content: Thought content normal.        Judgment: Judgment normal.     (all labs ordered are listed, but only abnormal results are displayed) Labs Reviewed  LIPASE, BLOOD  COMPREHENSIVE METABOLIC PANEL WITH GFR  CBC  URINALYSIS, ROUTINE W REFLEX MICROSCOPIC    EKG: None  Radiology: No results found.   Procedures   Medications Ordered in the ED  morphine  (PF) 4 MG/ML injection 4 mg (has no administration in time range)  ondansetron  (ZOFRAN ) injection 4 mg (has no administration in time range)                                    Medical Decision Making Amount and/or Complexity of Data Reviewed Labs: ordered. Radiology: ordered.  Risk Prescription drug management.   This patient presents to the ED for concern of abdominal pain differential diagnosis includes acute appendicitis, cholecystitis, bowel striction, diverticulitis, pyelonephritis, kidney stone, pancreatitis, mesenteric ischemia    Additional history obtained:  Additional history obtained from none External records from outside source obtained and reviewed including medical records   Lab Tests:  I Ordered, and personally interpreted labs.  The pertinent results include: No leukocytosis, no anemia, creatinine at baseline, normal electrolytes, normal liver function, negative lipase, unremarkable urinalysis   Imaging Studies ordered:  I ordered imaging studies including CT scan of the abdomen and pelvis I independently visualized and interpreted imaging which showed acute diverticulitis I agree with the  radiologist interpretation   Medicines ordered and prescription drug management:  I ordered medication including fentanyl , Zofran , Cipro , Flagyl  for diverticulitis Reevaluation of the patient after these medicines showed that the patient improved I have reviewed the patients home medicines and have made adjustments as needed   Problem List / ED Course:  Patient is doing well at this time and is stable for discharge home.  Discussed with patient CT scan of the abdomen and pelvis is consistent with acute diverticulitis.  Will treat with Cipro  and Flagyl  given  her penicillin allergy.  Patient had no indication for perforation or abscess formation.  No other acute surgical process was noted on CT scan of the abdomen and pelvis.  Did discuss the need for close follow-up with her gastroenterologist on outpatient basis for follow-up colonoscopy.  Patient has stable blood work at this point.  Vital signs are stable with no indication for sepsis.  Do not suspect that admission is warranted at this time.  Strict return precautions were provided for any new or worsening symptoms.  Patient and family voiced understanding and had no additional questions.   Social Determinants of Health:  None        Final diagnoses:  None    ED Discharge Orders     None          Jessica Hoover 12/29/23 1153    Simon Lavonia SAILOR, MD 12/29/23 1555

## 2023-12-29 NOTE — ED Notes (Signed)
 Pt/family received d/c paperwork at this time. After going over the paperwork any questions, comments, or concerns were answered to the best of this nurse's knowledge. The pt/family verbally acknowledged the teachings/instructions.

## 2024-01-03 ENCOUNTER — Encounter (HOSPITAL_COMMUNITY): Payer: Self-pay

## 2024-01-03 ENCOUNTER — Emergency Department (HOSPITAL_COMMUNITY)

## 2024-01-03 ENCOUNTER — Other Ambulatory Visit: Payer: Self-pay

## 2024-01-03 ENCOUNTER — Emergency Department (HOSPITAL_COMMUNITY): Admission: EM | Admit: 2024-01-03 | Discharge: 2024-01-03 | Disposition: A | Discharge status: Home / Self Care

## 2024-01-03 DIAGNOSIS — K5732 Diverticulitis of large intestine without perforation or abscess without bleeding: Secondary | ICD-10-CM | POA: Insufficient documentation

## 2024-01-03 DIAGNOSIS — Z7982 Long term (current) use of aspirin: Secondary | ICD-10-CM | POA: Diagnosis not present

## 2024-01-03 DIAGNOSIS — R1032 Left lower quadrant pain: Secondary | ICD-10-CM | POA: Diagnosis present

## 2024-01-03 HISTORY — DX: Diverticulitis of intestine, part unspecified, without perforation or abscess without bleeding: K57.92

## 2024-01-03 LAB — CBC WITH DIFFERENTIAL/PLATELET
Abs Immature Granulocytes: 0.02 K/uL (ref 0.00–0.07)
Basophils Absolute: 0 K/uL (ref 0.0–0.1)
Basophils Relative: 1 %
Eosinophils Absolute: 0.2 K/uL (ref 0.0–0.5)
Eosinophils Relative: 3 %
HCT: 37.6 % (ref 36.0–46.0)
Hemoglobin: 11.7 g/dL — ABNORMAL LOW (ref 12.0–15.0)
Immature Granulocytes: 0 %
Lymphocytes Relative: 35 %
Lymphs Abs: 1.9 K/uL (ref 0.7–4.0)
MCH: 28.5 pg (ref 26.0–34.0)
MCHC: 31.1 g/dL (ref 30.0–36.0)
MCV: 91.7 fL (ref 80.0–100.0)
Monocytes Absolute: 0.3 K/uL (ref 0.1–1.0)
Monocytes Relative: 5 %
Neutro Abs: 3.1 K/uL (ref 1.7–7.7)
Neutrophils Relative %: 56 %
Platelets: 293 K/uL (ref 150–400)
RBC: 4.1 MIL/uL (ref 3.87–5.11)
RDW: 13.1 % (ref 11.5–15.5)
WBC: 5.5 K/uL (ref 4.0–10.5)
nRBC: 0 % (ref 0.0–0.2)

## 2024-01-03 LAB — COMPREHENSIVE METABOLIC PANEL WITH GFR
ALT: 8 U/L (ref 0–44)
AST: 18 U/L (ref 15–41)
Albumin: 4.2 g/dL (ref 3.5–5.0)
Alkaline Phosphatase: 81 U/L (ref 38–126)
Anion gap: 14 (ref 5–15)
BUN: 20 mg/dL (ref 8–23)
CO2: 26 mmol/L (ref 22–32)
Calcium: 10.2 mg/dL (ref 8.9–10.3)
Chloride: 102 mmol/L (ref 98–111)
Creatinine, Ser: 1.66 mg/dL — ABNORMAL HIGH (ref 0.44–1.00)
GFR, Estimated: 31 mL/min — ABNORMAL LOW (ref >60)
Glucose, Bld: 147 mg/dL — ABNORMAL HIGH (ref 70–99)
Potassium: 3.6 mmol/L (ref 3.5–5.1)
Sodium: 141 mmol/L (ref 135–145)
Total Bilirubin: 0.3 mg/dL (ref 0.0–1.2)
Total Protein: 7.4 g/dL (ref 6.5–8.1)

## 2024-01-03 MED ORDER — IOHEXOL 300 MG/ML  SOLN
100.0000 mL | Freq: Once | INTRAMUSCULAR | Status: AC | PRN
  Administered 2024-01-03: 100 mL via INTRAVENOUS

## 2024-01-03 MED ORDER — CIPROFLOXACIN HCL 250 MG PO TABS
500.0000 mg | ORAL_TABLET | Freq: Once | ORAL | Status: AC
  Administered 2024-01-03: 500 mg via ORAL
  Filled 2024-01-03: qty 2

## 2024-01-03 MED ORDER — FENTANYL CITRATE (PF) 100 MCG/2ML IJ SOLN
50.0000 ug | Freq: Once | INTRAMUSCULAR | Status: AC
  Administered 2024-01-03: 50 ug via INTRAVENOUS
  Filled 2024-01-03: qty 2

## 2024-01-03 MED ORDER — ONDANSETRON HCL 4 MG/2ML IJ SOLN
4.0000 mg | Freq: Once | INTRAMUSCULAR | Status: AC
  Administered 2024-01-03: 4 mg via INTRAVENOUS
  Filled 2024-01-03: qty 2

## 2024-01-03 MED ORDER — POLYETHYLENE GLYCOL 3350 17 G PO PACK
17.0000 g | PACK | Freq: Every day | ORAL | 0 refills | Status: AC
Start: 2024-01-03 — End: ?

## 2024-01-03 MED ORDER — LACTATED RINGERS IV BOLUS
500.0000 mL | Freq: Once | INTRAVENOUS | Status: AC
  Administered 2024-01-03: 500 mL via INTRAVENOUS

## 2024-01-03 NOTE — Discharge Instructions (Addendum)
 The pleasure taking care of you today.  You are seen in the emergency room for continued left lower abdominal pain related to your diverticulitis.  We checked your blood work, this showed that your kidney function had slightly worsen, we gave you some IV fluids as this could be related to some mild dehydration.  You need to have your primary care doctor recheck your kidney function in about a week.  Make sure you drink plenty of fluids.  Your rest of your blood work was normal.  Your CT scan showed mildly improved diverticulitis.  It is very important for you to pick up the prescription for the ciprofloxacin  for your diverticulitis tomorrow morning and complete the course of antibiotics for this.  You need to follow-up with gastroenterology for this.  Please come back to the ER for any new or worsening symptoms.

## 2024-01-03 NOTE — ED Provider Notes (Signed)
 [place] EMERGENCY DEPARTMENT AT [facility] Provider Note   CSN: [id number] Arrival date & time: 01/03/24  1123     Patient presents with: Abdominal Pain (diverticulosis)   Jessica Hoover is a 78 y.o. female.  She presents the ER with worsening left lower quadrant and left low back pain.  She was seen on 12/29/2023 and diagnosed with diverticulitis.  She was given ciprofloxacin  and Flagyl  she has been taking but states the pain has worsened.  She has not been able to have a bowel movement, denies any blood in the stool, denies vomiting but she does have nausea.  No chest pain or shortness of breath.  No urinary symptoms.    Abdominal Pain      Prior to Admission medications   Medication Sig Start Date End Date Taking? Authorizing Provider  amLODipine  (NORVASC ) 10 MG tablet TAKE ONE TABLET BY MOUTH EVERY MORNING 12/23/23   Alvan Dorn FALCON, MD  aspirin  EC 81 MG tablet Take 1 tablet (81 mg total) by mouth daily. Swallow whole. 05/01/23   Alvan Dorn FALCON, MD  cholecalciferol (VITAMIN D3) 25 MCG (1000 UNIT) tablet Take 1,000 Units by mouth daily.    [provider]  ciprofloxacin  (CIPRO ) 500 MG tablet Take 1 tablet (500 mg total) by mouth every 12 (twelve) hours for 10 days. 12/29/23 01/08/24  Daralene Bruckner D, PA-C  ezetimibe  (ZETIA ) 10 MG tablet Take 1 tablet (10 mg total) by mouth daily. 07/29/23   Alvan Dorn FALCON, MD  furosemide (LASIX) 20 MG tablet Take 20 mg by mouth daily. 12/23/23   [provider]  losartan  (COZAAR ) 50 MG tablet TAKE ONE TABLET BY MOUTH TWICE DAILY 11/21/23   Alvan Dorn FALCON, MD  Menthol , Topical Analgesic, (BIOFREEZE) 4 % GEL Apply 1 Application topically as needed.    [provider]  metroNIDAZOLE  (FLAGYL ) 500 MG tablet Take 1 tablet (500 mg total) by mouth 2 (two) times daily for 10 days. 12/29/23 01/08/24  Daralene Bruckner D, PA-C  pioglitazone (ACTOS) 30 MG tablet Take 30 mg by mouth daily. 04/08/23   [provider]  RYBELSUS 3 MG TABS start with 1 tablet onca day for 1 month then change to 7mg  tabler Orally; Duration: 30 days 12/23/23   [provider]  spironolactone  (ALDACTONE ) 25 MG tablet TAKE ONE TABLET BY MOUTH EVERY MORNING 11/26/23   Alvan Dorn FALCON, MD    Allergies: Clarithromycin, Codeine, Hydrocodone, Penicillins, Sulfa antibiotics, Dilaudid  [hydromorphone  hcl], and Oxycodone  hcl    Review of Systems  Gastrointestinal:  Positive for abdominal pain.    Updated Vital Signs BP 117/61 (BP Location: Left Arm)   Pulse 74   Temp 98.2 F (36.8 C)   Resp 16   Ht 5' 4 (1.626 m)   Wt 90.7 kg   SpO2 97%   BMI 34.33 kg/m   Physical Exam Vitals and nursing note reviewed.  Constitutional:      General: She is not in acute distress.    Appearance: She is well-developed.  HENT:     Head: Normocephalic and atraumatic.  Eyes:     Extraocular Movements: Extraocular movements intact.     Conjunctiva/sclera: Conjunctivae normal.     Pupils: Pupils are equal, round, and reactive to light.  Cardiovascular:     Rate and Rhythm: Normal rate and regular rhythm.     Heart sounds: No murmur heard. Pulmonary:     Effort: Pulmonary effort is normal. No respiratory distress.  Breath sounds: Normal breath sounds.  Abdominal:     General: Abdomen is flat.     Palpations: Abdomen is soft.     Tenderness: There is abdominal tenderness in the left lower quadrant. There is no right CVA tenderness, left CVA tenderness, guarding or rebound.  Musculoskeletal:        General: No swelling.     Cervical back: Neck supple.  Skin:    General: Skin is warm and dry.     Capillary Refill: Capillary refill takes less than 2 seconds.  Neurological:     General: No focal deficit present.     Mental Status: She is alert.  Psychiatric:        Mood and Affect: Mood normal.     (all labs ordered are listed, but only abnormal results are displayed) Labs Reviewed  CBC WITH  DIFFERENTIAL/PLATELET - Abnormal; Notable for the following components:      Result Value   Hemoglobin 11.7 (*)    All other components within normal limits  COMPREHENSIVE METABOLIC PANEL WITH GFR - Abnormal; Notable for the following components:   Glucose, Bld 147 (*)    Creatinine, Ser 1.66 (*)    GFR, Estimated 31 (*)    All other components within normal limits    EKG: None  Radiology: No results found.   Procedures   Medications Ordered in the ED  iohexol  (OMNIPAQUE ) 300 MG/ML solution 100 mL (has no administration in time range)  ondansetron  (ZOFRAN ) injection 4 mg (4 mg Intravenous Given 01/03/24 1350)  fentaNYL  (SUBLIMAZE ) injection 50 mcg (50 mcg Intravenous Given 01/03/24 1350)  lactated ringers  bolus 500 mL (500 mLs Intravenous Bolus from Bag 01/03/24 1349)                                    Medical Decision Making This patient presents to the ED for concern of abdominal pain, this involves an extensive number of treatment options, and is a complaint that carries with it a high risk of complications and morbidity.  The differential diagnosis includes diverticulitis, kidney stone, abscess, perforation, other   Co morbidities that complicate the patient evaluation :   History of diverticulitis   Additional history obtained:  Additional history obtained from EMR External records from outside source obtained and reviewed including prior notes, labs, imagin   Lab Tests:  I Ordered, and personally interpreted labs.  The pertinent results include: CBC with mild increase in baseline creatinine at 1.66 compared to 1.41 days ago   Imaging Studies ordered:  I ordered imaging studies including CT abdomen pelvis which shows pancolonic diverticulosis with very mild inflammatory changes adjacent to distal descending colon slightly improved from previous CT 5 days ago I independently visualized and interpreted imaging within scope of identifying emergent findings  I  agree with the radiologist interpretation       Problem List / ED Course / Critical interventions / Medication management Diverticulitis-patient diagnosed 5 days ago but went to the pharmacy they gave her only the Flagyl  not the ciprofloxacin  so she is limited to Flagyl .  I confirmed with pharmacy that she did not fill this, I was unable to reach the pharmacy but it does appear that this patient was sent.  I informed patient that she will need to pick the Cipro  tomorrow.  Fortunately labs and CT are reassuring, she does not have an abscess or perforation, she is mildly improved  inflammatory changes on her CT.  Her creatinine had slightly increased from 1.41-1.66 so she was given small amount of IV fluids, encouraged to follow-up with her PCP.  She is advised on strict return precautions.  She reports she has not had a bowel movement in several days and feels like she is constipated.  Given MiraLAX  and advised to drink plenty of liquids. I ordered medication including fentanyl  for left lower quadrant pain Reevaluation of the patient after these medicines showed that the patient improved I have reviewed the patients home medicines and have made adjustments as needed   Social Determinants of Health: Patient lives at home   Amount and/or Complexity of Data Reviewed Labs: ordered. Radiology: ordered.  Risk OTC drugs. Prescription drug management.  MR      Final diagnoses:  None    ED Discharge Orders     None          Suellen Sherran DELENA DEVONNA 01/03/24 1807    Francesca Elsie CROME, MD 01/03/24 2145

## 2024-01-03 NOTE — ED Triage Notes (Signed)
 Pt has been having pain since the last time she was here (10/5). Pt was dx with diverticulosis. Pt went to PCP for abx since this is a flare up but they refused to give her any and told pt to be on a liquid diet. Pt states the pain has only gotten worse. A&Ox4.

## 2024-01-30 ENCOUNTER — Other Ambulatory Visit

## 2024-01-30 DIAGNOSIS — R3129 Other microscopic hematuria: Secondary | ICD-10-CM

## 2024-01-30 LAB — URINALYSIS, ROUTINE W REFLEX MICROSCOPIC
Bilirubin, UA: NEGATIVE
Glucose, UA: NEGATIVE
Ketones, UA: NEGATIVE
Leukocytes,UA: NEGATIVE
Nitrite, UA: NEGATIVE
Protein,UA: NEGATIVE
RBC, UA: NEGATIVE
Specific Gravity, UA: 1.03 (ref 1.005–1.030)
Urobilinogen, Ur: 1 mg/dL (ref 0.2–1.0)
pH, UA: 5.5 (ref 5.0–7.5)

## 2024-01-31 ENCOUNTER — Telehealth: Payer: Self-pay

## 2024-01-31 NOTE — Telephone Encounter (Signed)
 Pt call looking for referral from pcp  to [facility]. PT was unaware referral has been sent to [facility]

## 2024-02-01 ENCOUNTER — Encounter (HOSPITAL_COMMUNITY): Payer: Self-pay

## 2024-02-01 ENCOUNTER — Emergency Department (HOSPITAL_COMMUNITY)

## 2024-02-01 ENCOUNTER — Other Ambulatory Visit: Payer: Self-pay

## 2024-02-01 ENCOUNTER — Inpatient Hospital Stay (HOSPITAL_COMMUNITY)
Admission: EM | Admit: 2024-02-01 | Discharge: 2024-02-03 | DRG: 392 | Disposition: A | Discharge status: Home / Self Care | Attending: Family Medicine | Admitting: Family Medicine

## 2024-02-01 DIAGNOSIS — Z881 Allergy status to other antibiotic agents status: Secondary | ICD-10-CM | POA: Diagnosis not present

## 2024-02-01 DIAGNOSIS — I129 Hypertensive chronic kidney disease with stage 1 through stage 4 chronic kidney disease, or unspecified chronic kidney disease: Secondary | ICD-10-CM | POA: Diagnosis present

## 2024-02-01 DIAGNOSIS — K5792 Diverticulitis of intestine, part unspecified, without perforation or abscess without bleeding: Principal | ICD-10-CM | POA: Diagnosis present

## 2024-02-01 DIAGNOSIS — Z882 Allergy status to sulfonamides status: Secondary | ICD-10-CM | POA: Diagnosis not present

## 2024-02-01 DIAGNOSIS — Z79899 Other long term (current) drug therapy: Secondary | ICD-10-CM

## 2024-02-01 DIAGNOSIS — K5732 Diverticulitis of large intestine without perforation or abscess without bleeding: Secondary | ICD-10-CM | POA: Diagnosis present

## 2024-02-01 DIAGNOSIS — E872 Acidosis, unspecified: Secondary | ICD-10-CM | POA: Diagnosis present

## 2024-02-01 DIAGNOSIS — N179 Acute kidney failure, unspecified: Secondary | ICD-10-CM | POA: Diagnosis present

## 2024-02-01 DIAGNOSIS — Z96643 Presence of artificial hip joint, bilateral: Secondary | ICD-10-CM | POA: Diagnosis present

## 2024-02-01 DIAGNOSIS — Z7984 Long term (current) use of oral hypoglycemic drugs: Secondary | ICD-10-CM | POA: Diagnosis not present

## 2024-02-01 DIAGNOSIS — Z8249 Family history of ischemic heart disease and other diseases of the circulatory system: Secondary | ICD-10-CM

## 2024-02-01 DIAGNOSIS — Z90710 Acquired absence of both cervix and uterus: Secondary | ICD-10-CM

## 2024-02-01 DIAGNOSIS — Z885 Allergy status to narcotic agent status: Secondary | ICD-10-CM

## 2024-02-01 DIAGNOSIS — I10 Essential (primary) hypertension: Secondary | ICD-10-CM | POA: Diagnosis not present

## 2024-02-01 DIAGNOSIS — N1831 Chronic kidney disease, stage 3a: Secondary | ICD-10-CM | POA: Diagnosis present

## 2024-02-01 DIAGNOSIS — K7460 Unspecified cirrhosis of liver: Secondary | ICD-10-CM | POA: Diagnosis present

## 2024-02-01 DIAGNOSIS — Z9049 Acquired absence of other specified parts of digestive tract: Secondary | ICD-10-CM

## 2024-02-01 DIAGNOSIS — E1122 Type 2 diabetes mellitus with diabetic chronic kidney disease: Secondary | ICD-10-CM | POA: Diagnosis present

## 2024-02-01 DIAGNOSIS — Z7982 Long term (current) use of aspirin: Secondary | ICD-10-CM

## 2024-02-01 DIAGNOSIS — F17210 Nicotine dependence, cigarettes, uncomplicated: Secondary | ICD-10-CM | POA: Diagnosis present

## 2024-02-01 DIAGNOSIS — Z880 Allergy status to penicillin: Secondary | ICD-10-CM | POA: Diagnosis not present

## 2024-02-01 DIAGNOSIS — E119 Type 2 diabetes mellitus without complications: Secondary | ICD-10-CM

## 2024-02-01 DIAGNOSIS — E1169 Type 2 diabetes mellitus with other specified complication: Secondary | ICD-10-CM | POA: Diagnosis not present

## 2024-02-01 DIAGNOSIS — I2510 Atherosclerotic heart disease of native coronary artery without angina pectoris: Secondary | ICD-10-CM | POA: Diagnosis present

## 2024-02-01 LAB — URINALYSIS, ROUTINE W REFLEX MICROSCOPIC
Bilirubin Urine: NEGATIVE
Glucose, UA: NEGATIVE mg/dL
Hgb urine dipstick: NEGATIVE
Ketones, ur: NEGATIVE mg/dL
Leukocytes,Ua: NEGATIVE
Nitrite: NEGATIVE
Protein, ur: NEGATIVE mg/dL
Specific Gravity, Urine: 1.005 (ref 1.005–1.030)
pH: 5 (ref 5.0–8.0)

## 2024-02-01 LAB — CBC WITH DIFFERENTIAL/PLATELET
Abs Immature Granulocytes: 0.04 K/uL (ref 0.00–0.07)
Basophils Absolute: 0.1 K/uL (ref 0.0–0.1)
Basophils Relative: 1 %
Eosinophils Absolute: 0.2 K/uL (ref 0.0–0.5)
Eosinophils Relative: 2 %
HCT: 39.8 % (ref 36.0–46.0)
Hemoglobin: 12.4 g/dL (ref 12.0–15.0)
Immature Granulocytes: 0 %
Lymphocytes Relative: 23 %
Lymphs Abs: 2.1 K/uL (ref 0.7–4.0)
MCH: 28.3 pg (ref 26.0–34.0)
MCHC: 31.2 g/dL (ref 30.0–36.0)
MCV: 90.9 fL (ref 80.0–100.0)
Monocytes Absolute: 0.4 K/uL (ref 0.1–1.0)
Monocytes Relative: 5 %
Neutro Abs: 6.4 K/uL (ref 1.7–7.7)
Neutrophils Relative %: 69 %
Platelets: 282 K/uL (ref 150–400)
RBC: 4.38 MIL/uL (ref 3.87–5.11)
RDW: 13.6 % (ref 11.5–15.5)
WBC: 9.3 K/uL (ref 4.0–10.5)
nRBC: 0 % (ref 0.0–0.2)

## 2024-02-01 LAB — COMPREHENSIVE METABOLIC PANEL WITH GFR
ALT: 9 U/L (ref 0–44)
AST: 16 U/L (ref 15–41)
Albumin: 4.6 g/dL (ref 3.5–5.0)
Alkaline Phosphatase: 94 U/L (ref 38–126)
Anion gap: 14 (ref 5–15)
BUN: 21 mg/dL (ref 8–23)
CO2: 25 mmol/L (ref 22–32)
Calcium: 10.3 mg/dL (ref 8.9–10.3)
Chloride: 100 mmol/L (ref 98–111)
Creatinine, Ser: 1.36 mg/dL — ABNORMAL HIGH (ref 0.44–1.00)
GFR, Estimated: 40 mL/min — ABNORMAL LOW (ref >60)
Glucose, Bld: 109 mg/dL — ABNORMAL HIGH (ref 70–99)
Potassium: 3.7 mmol/L (ref 3.5–5.1)
Sodium: 138 mmol/L (ref 135–145)
Total Bilirubin: 0.3 mg/dL (ref 0.0–1.2)
Total Protein: 8.2 g/dL — ABNORMAL HIGH (ref 6.5–8.1)

## 2024-02-01 LAB — LACTIC ACID, PLASMA
Lactic Acid, Venous: 2.5 mmol/L (ref 0.5–1.9)
Lactic Acid, Venous: 1.4 mmol/L (ref 0.5–1.9)

## 2024-02-01 LAB — GLUCOSE, CAPILLARY
Glucose-Capillary: 131 mg/dL — ABNORMAL HIGH (ref 70–99)
Glucose-Capillary: 136 mg/dL — ABNORMAL HIGH (ref 70–99)

## 2024-02-01 LAB — LIPASE, BLOOD: Lipase: 20 U/L (ref 11–51)

## 2024-02-01 MED ORDER — FENTANYL CITRATE (PF) 50 MCG/ML IJ SOSY: 50.0000 ug | PREFILLED_SYRINGE | INTRAMUSCULAR | Status: DC | PRN

## 2024-02-01 MED ORDER — METRONIDAZOLE 500 MG/100ML IV SOLN
500.0000 mg | Freq: Two times a day (BID) | INTRAVENOUS | Status: DC
  Administered 2024-02-02 (×2): 500 mg via INTRAVENOUS
  Filled 2024-02-01 (×3): qty 100

## 2024-02-01 MED ORDER — ONDANSETRON HCL 4 MG/2ML IJ SOLN: 4.0000 mg | Freq: Four times a day (QID) | INTRAMUSCULAR | Status: DC | PRN

## 2024-02-01 MED ORDER — FENTANYL CITRATE (PF) 100 MCG/2ML IJ SOLN
50.0000 ug | Freq: Once | INTRAMUSCULAR | Status: AC
  Administered 2024-02-01: 50 ug via INTRAVENOUS
  Filled 2024-02-01: qty 2

## 2024-02-01 MED ORDER — POLYETHYLENE GLYCOL 3350 17 G PO PACK: 17.0000 g | PACK | Freq: Every day | ORAL | Status: DC | PRN

## 2024-02-01 MED ORDER — SODIUM CHLORIDE 0.9 % IV BOLUS
1000.0000 mL | Freq: Once | INTRAVENOUS | Status: AC
  Administered 2024-02-01: 1000 mL via INTRAVENOUS

## 2024-02-01 MED ORDER — ORAL CARE MOUTH RINSE: 15.0000 mL | OROMUCOSAL | Status: DC | PRN

## 2024-02-01 MED ORDER — METRONIDAZOLE 500 MG/100ML IV SOLN
500.0000 mg | Freq: Once | INTRAVENOUS | Status: AC
  Administered 2024-02-01: 500 mg via INTRAVENOUS
  Filled 2024-02-01: qty 100

## 2024-02-01 MED ORDER — ONDANSETRON HCL 4 MG PO TABS: 4.0000 mg | ORAL_TABLET | Freq: Four times a day (QID) | ORAL | Status: DC | PRN

## 2024-02-01 MED ORDER — CIPROFLOXACIN IN D5W 400 MG/200ML IV SOLN
400.0000 mg | Freq: Two times a day (BID) | INTRAVENOUS | Status: DC
  Administered 2024-02-02 – 2024-02-03 (×3): 400 mg via INTRAVENOUS
  Filled 2024-02-01 (×3): qty 200

## 2024-02-01 MED ORDER — ACETAMINOPHEN 650 MG RE SUPP: 650.0000 mg | Freq: Four times a day (QID) | RECTAL | Status: DC | PRN

## 2024-02-01 MED ORDER — IOHEXOL 300 MG/ML  SOLN
80.0000 mL | Freq: Once | INTRAMUSCULAR | Status: AC | PRN
  Administered 2024-02-01: 80 mL via INTRAVENOUS

## 2024-02-01 MED ORDER — ONDANSETRON HCL 4 MG/2ML IJ SOLN
4.0000 mg | Freq: Once | INTRAMUSCULAR | Status: AC
  Administered 2024-02-01: 4 mg via INTRAVENOUS
  Filled 2024-02-01: qty 2

## 2024-02-01 MED ORDER — CIPROFLOXACIN IN D5W 400 MG/200ML IV SOLN
400.0000 mg | Freq: Once | INTRAVENOUS | Status: AC
  Administered 2024-02-01: 400 mg via INTRAVENOUS
  Filled 2024-02-01: qty 200

## 2024-02-01 MED ORDER — AMLODIPINE BESYLATE 5 MG PO TABS
10.0000 mg | ORAL_TABLET | Freq: Every morning | ORAL | Status: DC
  Administered 2024-02-02 – 2024-02-03 (×2): 10 mg via ORAL
  Filled 2024-02-01 (×2): qty 2

## 2024-02-01 MED ORDER — INSULIN ASPART 100 UNIT/ML IJ SOLN
0.0000 [IU] | INTRAMUSCULAR | Status: DC
  Administered 2024-02-01: 1 [IU] via SUBCUTANEOUS
  Administered 2024-02-02: 2 [IU] via SUBCUTANEOUS
  Filled 2024-02-01 (×2): qty 1

## 2024-02-01 MED ORDER — ASPIRIN 81 MG PO TBEC
81.0000 mg | DELAYED_RELEASE_TABLET | Freq: Every day | ORAL | Status: DC
  Administered 2024-02-02 – 2024-02-03 (×2): 81 mg via ORAL
  Filled 2024-02-01 (×2): qty 1

## 2024-02-01 MED ORDER — EZETIMIBE 10 MG PO TABS
10.0000 mg | ORAL_TABLET | Freq: Every day | ORAL | Status: DC
  Administered 2024-02-02 – 2024-02-03 (×2): 10 mg via ORAL
  Filled 2024-02-01 (×2): qty 1

## 2024-02-01 MED ORDER — ENOXAPARIN SODIUM 60 MG/0.6ML IJ SOSY
0.5000 mg/kg | PREFILLED_SYRINGE | INTRAMUSCULAR | Status: DC
  Administered 2024-02-01: 45 mg via SUBCUTANEOUS
  Filled 2024-02-01: qty 0.6

## 2024-02-01 MED ORDER — SPIRONOLACTONE 25 MG PO TABS
25.0000 mg | ORAL_TABLET | Freq: Every morning | ORAL | Status: DC
  Administered 2024-02-02 – 2024-02-03 (×2): 25 mg via ORAL
  Filled 2024-02-01 (×2): qty 1

## 2024-02-01 MED ORDER — SODIUM CHLORIDE 0.9 % IV SOLN: INTRAVENOUS | Status: AC

## 2024-02-01 MED ORDER — ACETAMINOPHEN 325 MG PO TABS: 650.0000 mg | ORAL_TABLET | Freq: Four times a day (QID) | ORAL | Status: DC | PRN

## 2024-02-01 NOTE — ED Triage Notes (Signed)
 Patient come in POV for diverticulitis flare up. Patient stated I have been seen here for it before. Has previous D/C Paperwork from 10/10 and 10/5.

## 2024-02-01 NOTE — ED Notes (Signed)
 ED Provider at bedside.

## 2024-02-01 NOTE — Progress Notes (Signed)
 Patient arrived to unit via wheelchair accompanied by ED staff and family. Patient alert and oriented, ambulating independently. Reports abdominal pain 5/10, but reports much improved since arrival to ED. Patient reports not eating since arrival to ED and is diabetic, CBG 131. Dr. Pearlean made aware of patient's arrival and orders requested. Patient comfortable at this time, call bell within reach.

## 2024-02-01 NOTE — ED Notes (Signed)
 Patient transported to CT

## 2024-02-01 NOTE — ED Provider Notes (Signed)
 [place] EMERGENCY DEPARTMENT AT [facility] Provider Note   CSN: [id number] Arrival date & time: 02/01/24  1501     Patient presents with: Abdominal Pain   Jessica Hoover is a 78 y.o. female.   Patient is a 78 year old female who presents to Emergency Department with a chief complaint of ongoing left lower quadrant abdominal pain.  She was seen in the emergency department twice last month secondary to diverticulitis and did complete her course of Cipro  and Flagyl .  She notes that the pain never completely resolved.  She denies any associated vomiting but does admit to nausea.  She has had no changes in bowel habits to include constipation or diarrhea.  She has had no dysuria or hematuria.   Abdominal Pain      Prior to Admission medications   Medication Sig Start Date End Date Taking? Authorizing Provider  amLODipine  (NORVASC ) 10 MG tablet TAKE ONE TABLET BY MOUTH EVERY MORNING 12/23/23   Alvan Dorn FALCON, MD  aspirin  EC 81 MG tablet Take 1 tablet (81 mg total) by mouth daily. Swallow whole. 05/01/23   Alvan Dorn FALCON, MD  cholecalciferol (VITAMIN D3) 25 MCG (1000 UNIT) tablet Take 1,000 Units by mouth daily.    [provider]  ezetimibe  (ZETIA ) 10 MG tablet Take 1 tablet (10 mg total) by mouth daily. 07/29/23   Alvan Dorn FALCON, MD  furosemide (LASIX) 20 MG tablet Take 20 mg by mouth daily. 12/23/23   [provider]  losartan  (COZAAR ) 50 MG tablet TAKE ONE TABLET BY MOUTH TWICE DAILY 11/21/23   Alvan Dorn FALCON, MD  Menthol , Topical Analgesic, (BIOFREEZE) 4 % GEL Apply 1 Application topically as needed.    [provider]  pioglitazone (ACTOS) 30 MG tablet Take 30 mg by mouth daily. 04/08/23   [provider]  polyethylene glycol (MIRALAX ) 17 g packet Take 17 g by mouth daily. 01/03/24   Suellen Cantor A, PA-C  RYBELSUS 3 MG TABS start with 1 tablet onca day for 1 month then change to 7mg  tabler Orally; Duration: 30 days 12/23/23    [provider]  spironolactone  (ALDACTONE ) 25 MG tablet TAKE ONE TABLET BY MOUTH EVERY MORNING 11/26/23   Alvan Dorn FALCON, MD    Allergies: Clarithromycin, Codeine, Hydrocodone, Penicillins, Sulfa antibiotics, Dilaudid  [hydromorphone  hcl], and Oxycodone  hcl    Review of Systems  Gastrointestinal:  Positive for abdominal pain.  All other systems reviewed and are negative.   Updated Vital Signs BP (!) 155/72 (BP Location: Right Arm)   Pulse 88   Temp 99.3 F (37.4 C) (Oral)   Resp 17   Ht 5' 4 (1.626 m)   Wt 90.7 kg   SpO2 100%   BMI 34.33 kg/m   Physical Exam Vitals and nursing note reviewed.  Constitutional:      General: She is not in acute distress.    Appearance: Normal appearance. She is not ill-appearing.  HENT:     Head: Normocephalic and atraumatic.     Nose: Nose normal.     Mouth/Throat:     Mouth: Mucous membranes are moist.  Eyes:     Extraocular Movements: Extraocular movements intact.     Conjunctiva/sclera: Conjunctivae normal.     Pupils: Pupils are equal, round, and reactive to light.  Cardiovascular:     Rate and Rhythm: Normal rate and regular rhythm.     Pulses: Normal pulses.     Heart sounds: Normal heart sounds. No murmur heard.  No gallop.  Pulmonary:     Effort: Pulmonary effort is normal. No respiratory distress.     Breath sounds: Normal breath sounds. No stridor. No wheezing or rales.  Abdominal:     General: Abdomen is flat. Bowel sounds are normal. There is no distension. There are no signs of injury.     Palpations: Abdomen is soft.     Tenderness: There is abdominal tenderness in the left lower quadrant.     Hernia: No hernia is present.  Musculoskeletal:        General: Normal range of motion.     Cervical back: Normal range of motion and neck supple.  Skin:    General: Skin is warm and dry.  Neurological:     General: No focal deficit present.     Mental Status: She is alert and oriented to person, place, and  time. Mental status is at baseline.  Psychiatric:        Mood and Affect: Mood normal.        Behavior: Behavior normal.        Thought Content: Thought content normal.        Judgment: Judgment normal.     (all labs ordered are listed, but only abnormal results are displayed) Labs Reviewed  CULTURE, BLOOD (ROUTINE X 2)  CULTURE, BLOOD (ROUTINE X 2)  COMPREHENSIVE METABOLIC PANEL WITH GFR  CBC WITH DIFFERENTIAL/PLATELET  LACTIC ACID, PLASMA  LACTIC ACID, PLASMA  LIPASE, BLOOD  URINALYSIS, ROUTINE W REFLEX MICROSCOPIC    EKG: None  Radiology: No results found.   Procedures   Medications Ordered in the ED  sodium chloride  0.9 % bolus 1,000 mL (has no administration in time range)  ondansetron  (ZOFRAN ) injection 4 mg (has no administration in time range)  fentaNYL  (SUBLIMAZE ) injection 50 mcg (has no administration in time range)                                    Medical Decision Making Amount and/or Complexity of Data Reviewed Labs: ordered. Radiology: ordered.  Risk Prescription drug management. Decision regarding hospitalization.   This patient presents to the ED for concern of abdominal pain differential diagnosis includes acute appendicitis, cholecystitis, bowel obstruction, diverticulitis, pyelonephritis, kidney stone, pancreatitis, mesenteric ischemia    Additional history obtained:  Additional history obtained from medical records External records from outside source obtained and reviewed including records   Lab Tests:  I Ordered, and personally interpreted labs.  The pertinent results include: No leukocytosis, no anemia, creatinine at baseline, normal liver function, unremarkable electrolytes, elevated lactic acid, unremarkable urinalysis   Imaging Studies ordered:  I ordered imaging studies including CT scan abdomen pelvis I independently visualized and interpreted imaging which showed acute diverticulitis, no abscess summation, progress  since previous I agree with the radiologist interpretation   Medicines ordered and prescription drug management:  I ordered medication including Cipro , Flagyl , fentanyl , Zofran , IV fluids for acute diverticulitis Reevaluation of the patient after these medicines showed that the patient improved I have reviewed the patients home medicines and have made adjustments as needed   Problem List / ED Course:  Patient is doing well at this time and does remain stable.  Discussed with patient that CT scan of the abdomen and pelvis is consistent with progressive diverticulitis.  Patient has no indication of perforation or abscess formation at this time.  Patient has stable vital signs with no indication  for sepsis at this point.  She does have a mild elevation of her lactic acid and has been given IV fluids.  Did start patient on IV antibiotics as well.  Do suspect that admission is warranted at this time given the fact that she is failing outpatient management.  Have discussed patient case with Dr. FORBES Carwin with the hospitalist service who has excepted for admission.   Social Determinants of Health:  None        Final diagnoses:  None    ED Discharge Orders     None          Daralene Lonni JONETTA DEVONNA 02/01/24 1804    Francesca Elsie CROME, MD 02/01/24 [phone]

## 2024-02-01 NOTE — H&P (Addendum)
 History and Physical    Jessica Hoover FMW:[id number] DOB: 05-29-1945 DOA: 02/01/2024  PCP: Katrinka Aquas, MD   Patient coming from: Home  I have personally briefly reviewed patient's old medical records in [facility]  Chief Complaint: Abdominal pain  HPI: Jessica Hoover is a 78 y.o. female with medical history significant for diverticulitis, diabetes mellitus, hypertension, seizures, coronary artery disease. Patient presented to the ED with complaints of left lower quadrant pain.  Patient was in the ED 10/5 and again 10/10, was diagnosed with acute diverticulitis, was discharged to complete a course of ciprofloxacin  and metronidazole .  She reports symptoms never completely resolved.  She reports nausea but no vomiting.  No diarrhea.  No fevers no chills.  She denies urinary symptoms.  She denies recent colonoscopies.  ED Course: Temperature 99.3.  Heart rate 76-88.  Respirate rate 18-18.  Blood pressure systolic 118-155. WBC 9.3.  Lactic acidosis of 2.5.  UA not suggestive of UTI. CTAP W contrast-acute uncomplicated descending colon diverticulitis, progressed from 01/03/24, colonoscopy recommended after resolution of acute symptoms to exclude underlying mass. Blood cultures obtained. 1 L bolus given IV Cipro  and metronidazole  started.  Review of Systems: As per HPI all other systems reviewed and negative.  Past Medical History:  Diagnosis Date   Arthritis    Chronic back pain    Chronic pelvic pain in female    pelvic joint and thigh   Chronic right hip pain    Chronic shoulder pain    Cirrhosis (HCC)    Diverticulitis    Frequency of urination    Heart murmur    Hypertension    Lumbago    Neuralgia and neuritis    Osteoarthritis    Pneumonia    hx of   PONV (postoperative nausea and vomiting)    Radiculitis    Sciatica    Seizures (HCC)    had seizures as a child    Past Surgical History:  Procedure Laterality Date   ABDOMINAL HYSTERECTOMY     partial   BACK  SURGERY     CHOLECYSTECTOMY     COLONOSCOPY W/ POLYPECTOMY     JOINT REPLACEMENT     TOTAL HIP ARTHROPLASTY Right 10/26/2014   Procedure: TOTAL HIP ARTHROPLASTY;  Surgeon: Maude LELON Right, MD;  Location: [facility] OR;  Service: Orthopedics;  Laterality: Right;   TOTAL HIP ARTHROPLASTY Left 10/09/2016   TOTAL HIP ARTHROPLASTY Left 10/09/2016   Procedure: LEFT TOTAL HIP ARTHROPLASTY;  Surgeon: Right Maude LELON, MD;  Location: [facility] OR;  Service: Orthopedics;  Laterality: Left;     reports that she has been smoking cigarettes. She started smoking about 58 years ago. She has a 14.6 pack-year smoking history. She has never used smokeless tobacco. She reports that she does not drink alcohol and does not use drugs.  Allergies  Allergen Reactions   Clarithromycin Shortness Of Breath   Codeine Shortness Of Breath   Hydrocodone Shortness Of Breath   Penicillins Shortness Of Breath and Other (See Comments)     PATIENT HAS HAD A PCN REACTION WITH IMMEDIATE RASH, FACIAL/TONGUE/THROAT SWELLING, SOB, OR LIGHTHEADEDNESS WITH HYPOTENSION:  #  #  #  YES  #  #  #   Has patient had a PCN reaction causing severe rash involving mucus membranes or skin necrosis: No Has patient had a PCN reaction that required hospitalization: No Has patient had a PCN reaction occurring within the last 10 years: No    Sulfa Antibiotics Shortness Of  Breath   Dilaudid  [Hydromorphone  Hcl] Other (See Comments)    ORAL SORES   Oxycodone  Hcl Nausea Only    Family History  Problem Relation Age of Onset   Hypertension Mother    Depression Mother    Hypertension Father    Cancer Father     Prior to Admission medications   Medication Sig Start Date End Date Taking? Authorizing Provider  amLODipine  (NORVASC ) 10 MG tablet TAKE ONE TABLET BY MOUTH EVERY MORNING 12/23/23  Yes Branch, Dorn FALCON, MD  aspirin  EC 81 MG tablet Take 1 tablet (81 mg total) by mouth daily. Swallow whole. 05/01/23  Yes BranchDorn FALCON, MD  ezetimibe  (ZETIA ) 10  MG tablet Take 1 tablet (10 mg total) by mouth daily. 07/29/23  Yes BranchDorn FALCON, MD  furosemide (LASIX) 20 MG tablet Take 20 mg by mouth daily. 12/23/23  Yes [provider]  losartan  (COZAAR ) 50 MG tablet TAKE ONE TABLET BY MOUTH TWICE DAILY 11/21/23  Yes Branch, Dorn FALCON, MD  Menthol , Topical Analgesic, (BIOFREEZE) 4 % GEL Apply 1 Application topically as needed (for pain).   Yes [provider]  pioglitazone (ACTOS) 30 MG tablet Take 30 mg by mouth daily. 04/08/23  Yes [provider]  polyethylene glycol (MIRALAX ) 17 g packet Take 17 g by mouth daily. Patient taking differently: Take 17 g by mouth daily as needed for mild constipation or moderate constipation. 01/03/24  Yes Beatty, Celeste A, PA-C  Semaglutide (RYBELSUS) 7 MG TABS Take 7 mg by mouth every morning. Before all other medications in the morning   Yes [provider]  spironolactone  (ALDACTONE ) 25 MG tablet TAKE ONE TABLET BY MOUTH EVERY MORNING 11/26/23  Yes Alvan Dorn FALCON, MD    Physical Exam: Vitals:   02/01/24 1510 02/01/24 1511 02/01/24 1615 02/01/24 1630  BP:  (!) 155/72 139/71 132/68  Pulse:  88 76 76  Resp:  17 15 13   Temp:  99.3 F (37.4 C)    TempSrc:  Oral    SpO2:  100% 97% 98%  Weight: 90.7 kg     Height: 5' 4 (1.626 m)       Constitutional: NAD, calm, comfortable Vitals:   02/01/24 1510 02/01/24 1511 02/01/24 1615 02/01/24 1630  BP:  (!) 155/72 139/71 132/68  Pulse:  88 76 76  Resp:  17 15 13   Temp:  99.3 F (37.4 C)    TempSrc:  Oral    SpO2:  100% 97% 98%  Weight: 90.7 kg     Height: 5' 4 (1.626 m)      Eyes: PERRL, lids and conjunctivae normal ENMT: Mucous membranes are moist.   Neck: normal, supple, no masses, no thyromegaly Respiratory: clear to auscultation bilaterally, no wheezing, no crackles. Normal respiratory effort. No accessory muscle use.  Cardiovascular: Regular rate and rhythm, no murmurs / rubs / gallops. No extremity edema. 2+ pedal  pulses. No carotid bruits.  Abdomen: Mild left lower quadrant and suprapubic tenderness, no masses palpated. No hepatosplenomegaly.   Musculoskeletal: no clubbing / cyanosis. No joint deformity upper and lower extremities.  Skin: no rashes, lesions, ulcers. No induration Neurologic: No facial asymmetry, moving extremities spontaneously, speech fluent. Psychiatric: Normal judgment and insight. Alert and oriented x 3. Normal mood.   Labs on Admission: I have personally reviewed following labs and imaging studies  CBC: Recent Labs  Lab 02/01/24 1551  WBC 9.3  NEUTROABS 6.4  HGB 12.4  HCT 39.8  MCV 90.9  PLT 282  Basic Metabolic Panel: Recent Labs  Lab 02/01/24 1551  NA 138  K 3.7  CL 100  CO2 25  GLUCOSE 109*  BUN 21  CREATININE 1.36*  CALCIUM 10.3   GFR: Estimated Creatinine Clearance: 37.2 mL/min (A) (by C-G formula based on SCr of 1.36 mg/dL (H)). Liver Function Tests: Recent Labs  Lab 02/01/24 1551  AST 16  ALT 9  ALKPHOS 94  BILITOT 0.3  PROT 8.2*  ALBUMIN 4.6   Recent Labs  Lab 02/01/24 1551  LIPASE 20   Urine analysis:    Component Value Date/Time   COLORURINE STRAW (A) 02/01/2024 1558   APPEARANCEUR CLEAR 02/01/2024 1558   APPEARANCEUR Clear 01/30/2024 0907   LABSPEC 1.005 02/01/2024 1558   PHURINE 5.0 02/01/2024 1558   GLUCOSEU NEGATIVE 02/01/2024 1558   HGBUR NEGATIVE 02/01/2024 1558   BILIRUBINUR NEGATIVE 02/01/2024 1558   BILIRUBINUR Negative 01/30/2024 0907   KETONESUR NEGATIVE 02/01/2024 1558   PROTEINUR NEGATIVE 02/01/2024 1558   UROBILINOGEN 1.0 10/31/2014 2200   NITRITE NEGATIVE 02/01/2024 1558   LEUKOCYTESUR NEGATIVE 02/01/2024 1558    Radiological Exams on Admission: CT ABDOMEN PELVIS W CONTRAST Result Date: 02/01/2024 EXAM: CT ABDOMEN AND PELVIS WITH CONTRAST 02/01/2024 05:01:03 PM TECHNIQUE: CT of the abdomen and pelvis was performed with the administration of 80 mL of iohexol  (OMNIPAQUE ) 300 MG/ML solution. Multiplanar  reformatted images are provided for review. Automated exposure control, iterative reconstruction, and/or weight-based adjustment of the mA/kV was utilized to reduce the radiation dose to as low as reasonably achievable. COMPARISON: 10 / 10 / 25. CLINICAL HISTORY: LLQ abdominal pain. FINDINGS: LOWER CHEST: No acute abnormality. LIVER: Cirrhosis. GALLBLADDER AND BILE DUCTS: Cholecystectomy. No biliary ductal dilatation. SPLEEN: No acute abnormality. PANCREAS: No acute abnormality. ADRENAL GLANDS: No acute abnormality. KIDNEYS, URETERS AND BLADDER: No stones in the kidneys or ureters. No hydronephrosis. No perinephric or periureteral stranding. Urinary bladder is unremarkable. GI AND BOWEL: Stomach demonstrates no acute abnormality. Extensive descending and sigmoid diverticulosis. Wall thickening and adjacent pericolonic inflammatory stranding and trace fluid about the descending colon. Normal appendix. Findings are consistent with acute uncomplicated descending colon diverticulitis. There is no bowel obstruction. PERITONEUM AND RETROPERITONEUM: No ascites. No free air. VASCULATURE: Aorta is normal in caliber. Aortic atherosclerotic calcification. LYMPH NODES: No lymphadenopathy. REPRODUCTIVE ORGANS: No acute abnormality. BONES AND SOFT TISSUES: Evaluation of the pelvis is limited due to streak artifact from bilateral hip arthroplasties. No acute osseous abnormality. No focal soft tissue abnormality. IMPRESSION: 1. Acute uncomplicated descending colon diverticulitis, progress from 10 / 10 / 25. Consider a follow up colonoscopy after resolution of acute symptoms to exclude underlying mass. Electronically signed by: Norman Gatlin MD 02/01/2024 05:07 PM EST RP Workstation: [id number]   EKG: None   Assessment/Plan Principal Problem:   Acute diverticulitis Active Problems:   Essential hypertension   Diabetes (HCC)   Cirrhosis of liver without ascites (HCC)   Assessment and Plan:  Acute  diverticulitis-afebrile, WBC 9.3.  Meeting sepsis criteria. Lactic acid 2.5 > 1.4.  Persistent pain for about a month, ED visits 10/5 and 10/10 also for acute diverticulitis.  CT abdomen and pelvis W contrast-acute uncomplicated descending colon diverticulitis progressed from 01/03/2024.  Colonoscopy recommended after resolution of acute symptoms to exclude underlying mass.  No colonoscopy on file. - IV ciprofloxacin  and metronidazole  started in ED - 1 L bolus given, continue N/s 75cc/hr x 15hrs - Follow-up blood cultures - Allergy to penicillins.  Per pharmacy, no documentation of patient having received cephalosporins in the past.  Low threshold to broaden antibiotic coverage - Hold Lasix  Hypertension-stable. - Resume Norvasc , spironolactone  - Hold losartan  for contrast exposure  Diabetes mellitus - HgbA1c - SSI- S - Hold Actos, semaglutide  Liver cirrhosis-stable.  No stigmata of chronic liver disease.  DVT prophylaxis: Lovenox  Code Status: FULL Family Communication: None at bedside Disposition Plan:  ~ 2 days Consults called: None Admission status: Inpt med surg I certify that at the point of admission it is my clinical judgment that the patient will require inpatient hospital care spanning beyond 2 midnights from the point of admission due to high intensity of service, high risk for further deterioration and high frequency of surveillance required.    Author: Tully FORBES Carwin, MD 02/01/2024 8:21 PM  For on call review [email or web address].

## 2024-02-02 DIAGNOSIS — K5792 Diverticulitis of intestine, part unspecified, without perforation or abscess without bleeding: Secondary | ICD-10-CM | POA: Diagnosis not present

## 2024-02-02 LAB — GLUCOSE, CAPILLARY
Glucose-Capillary: 101 mg/dL — ABNORMAL HIGH (ref 70–99)
Glucose-Capillary: 103 mg/dL — ABNORMAL HIGH (ref 70–99)
Glucose-Capillary: 107 mg/dL — ABNORMAL HIGH (ref 70–99)
Glucose-Capillary: 118 mg/dL — ABNORMAL HIGH (ref 70–99)
Glucose-Capillary: 151 mg/dL — ABNORMAL HIGH (ref 70–99)
Glucose-Capillary: 87 mg/dL (ref 70–99)
Glucose-Capillary: 94 mg/dL (ref 70–99)

## 2024-02-02 LAB — BASIC METABOLIC PANEL WITH GFR
Anion gap: 9 (ref 5–15)
BUN: 15 mg/dL (ref 8–23)
CO2: 25 mmol/L (ref 22–32)
Calcium: 9.2 mg/dL (ref 8.9–10.3)
Chloride: 106 mmol/L (ref 98–111)
Creatinine, Ser: 1.36 mg/dL — ABNORMAL HIGH (ref 0.44–1.00)
GFR, Estimated: 40 mL/min — ABNORMAL LOW (ref >60)
Glucose, Bld: 91 mg/dL (ref 70–99)
Potassium: 3.6 mmol/L (ref 3.5–5.1)
Sodium: 140 mmol/L (ref 135–145)

## 2024-02-02 LAB — HEMOGLOBIN A1C
Hgb A1c MFr Bld: 6.9 % — ABNORMAL HIGH (ref 4.8–5.6)
Mean Plasma Glucose: 151.33 mg/dL

## 2024-02-02 LAB — CBC
HCT: 33.2 % — ABNORMAL LOW (ref 36.0–46.0)
Hemoglobin: 10.3 g/dL — ABNORMAL LOW (ref 12.0–15.0)
MCH: 27.8 pg (ref 26.0–34.0)
MCHC: 31 g/dL (ref 30.0–36.0)
MCV: 89.5 fL (ref 80.0–100.0)
Platelets: 244 K/uL (ref 150–400)
RBC: 3.71 MIL/uL — ABNORMAL LOW (ref 3.87–5.11)
RDW: 13.6 % (ref 11.5–15.5)
WBC: 6.8 K/uL (ref 4.0–10.5)
nRBC: 0 % (ref 0.0–0.2)

## 2024-02-02 MED ORDER — ENOXAPARIN SODIUM 40 MG/0.4ML IJ SOSY
40.0000 mg | PREFILLED_SYRINGE | INTRAMUSCULAR | Status: DC
  Administered 2024-02-02: 40 mg via SUBCUTANEOUS
  Filled 2024-02-02: qty 0.4

## 2024-02-02 NOTE — Plan of Care (Signed)
  Problem: Clinical Measurements: Goal: Will remain free from infection Outcome: Progressing Goal: Diagnostic test results will improve Outcome: Progressing   Problem: Activity: Goal: Risk for activity intolerance will decrease Outcome: Progressing   Problem: Coping: Goal: Level of anxiety will decrease Outcome: Progressing   Problem: Pain Managment: Goal: General experience of comfort will improve and/or be controlled Outcome: Progressing   Problem: Safety: Goal: Ability to remain free from injury will improve Outcome: Progressing   Problem: Skin Integrity: Goal: Risk for impaired skin integrity will decrease Outcome: Progressing   Problem: Metabolic: Goal: Ability to maintain appropriate glucose levels will improve Outcome: Progressing   Problem: Skin Integrity: Goal: Risk for impaired skin integrity will decrease Outcome: Progressing   Problem: Tissue Perfusion: Goal: Adequacy of tissue perfusion will improve Outcome: Progressing

## 2024-02-02 NOTE — TOC CM/SW Note (Signed)
 Transition of Care [facility]) - Inpatient Brief Assessment   Patient Details  Name: Jessica Hoover MRN: [id number] Date of Birth: 1945-11-25  Transition of Care [facility] CM/SW Contact:    Noreen KATHEE Cleotilde ISRAEL Phone Number: 02/02/2024, 8:56 AM   Clinical Narrative:  Inpatient Care Management (ICM) has reviewed patient and no other ICM needs have been identified at this time. We will continue to monitor patient advancement through interdisciplinary progression rounds. If new patient transition needs arise, please place a ICM consult.  Transition of Care Asessment: Insurance and Status: Insurance coverage has been reviewed Patient has primary care physician: Yes Home environment has been reviewed: From Home Prior level of function:: Independent Prior/Current Home Services: No current home services Social Drivers of Health Review: SDOH reviewed no interventions necessary Readmission risk has been reviewed: Yes Transition of care needs: no transition of care needs at this time

## 2024-02-02 NOTE — Progress Notes (Signed)
 [facility] PROGRESS NOTE  Jessica Hoover (DOB: [birth year]) FMW:[id number] PCP: Katrinka Aquas, MD  Brief Narrative: Jessica Hoover is a 78 y.o. female with medical history significant for diverticulitis, diabetes mellitus, hypertension, seizures, coronary artery disease. Patient presented to the ED with complaints of left lower quadrant pain.  Patient was in the ED 10/5 and again 10/10, was diagnosed with acute diverticulitis, was discharged to complete a course of ciprofloxacin  and metronidazole .  She reports symptoms never completely resolved.  She reports nausea but no vomiting.  No diarrhea.  No fevers no chills.  She denies urinary symptoms.  She denies recent colonoscopies.   ED Course: Temperature 99.3.  Heart rate 76-88.  Respirate rate 18-18.  Blood pressure systolic 118-155. WBC 9.3.  Lactic acidosis of 2.5.  UA not suggestive of UTI. CTAP W contrast-acute uncomplicated descending colon diverticulitis, progressed from 01/03/24, colonoscopy recommended after resolution of acute symptoms to exclude underlying mass. Blood cultures obtained. 1 L bolus given IV Cipro  and metronidazole  started.  Subjective: Abdominal pain improving, no change with po intake, no BM yet. No fevers. No pneumaturia or gross hematuria.   Objective: BP (!) 122/54 (BP Location: Left Arm)   Pulse 71   Temp 98.3 F (36.8 C) (Oral)   Resp 16   Ht 5' 4 (1.626 m)   Wt 90.7 kg   SpO2 97%   BMI 34.33 kg/m   Gen: No distress Pulm: Clear, nonlabored  CV: RRR, no MRG GI: Soft, tender in Left quadrants, no rebound or guarding, also some suprapubic tenderness, hypoactive BS Neuro: Alert and oriented. No new focal deficits. Ext: Warm, no deformities. Skin: No rashes, lesions or ulcers on visualized skin   Assessment & Plan: Acute recurrent diverticulitis: Failed outpatient management.  - Will need colonoscopy after convalescence.  - Continue IV ciprofloxacin  and metronidazole . Options limited by PCN  allergy.  - Exam shows significant tenderness, but no need for surgical evaluation at this time.  - Will advance diet. Hopeful for continued improvement and discharge 11/10 back on oral antibiotics (note she is admitted for failure of outpatient management).    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   T2DM:  - Continue SSI - Hold Actos, semaglutide   Liver cirrhosis: No stigmata of chronic liver disease.  Bernardino KATHEE Come, MD [facility] [email or web address] 02/02/2024, 1:53 PM

## 2024-02-03 ENCOUNTER — Ambulatory Visit: Payer: Self-pay

## 2024-02-03 DIAGNOSIS — K5792 Diverticulitis of intestine, part unspecified, without perforation or abscess without bleeding: Secondary | ICD-10-CM | POA: Diagnosis not present

## 2024-02-03 LAB — GLUCOSE, CAPILLARY
Glucose-Capillary: 98 mg/dL (ref 70–99)
Glucose-Capillary: 118 mg/dL — ABNORMAL HIGH (ref 70–99)

## 2024-02-03 MED ORDER — METRONIDAZOLE 500 MG PO TABS: 500.0000 mg | ORAL_TABLET | Freq: Two times a day (BID) | ORAL | 0 refills | Status: AC

## 2024-02-03 MED ORDER — CIPROFLOXACIN HCL 500 MG PO TABS: 500.0000 mg | ORAL_TABLET | Freq: Two times a day (BID) | ORAL | 0 refills | Status: AC

## 2024-02-03 NOTE — Plan of Care (Signed)

## 2024-02-03 NOTE — Discharge Summary (Signed)
 Physician Discharge Summary   Patient: Jessica Hoover MRN: [id number] DOB: Jun 01, 1945  Admit date:     02/01/2024  Discharge date: 02/03/24  Discharge Physician: Bernardino KATHEE Come   PCP: Katrinka Aquas, MD   Recommendations at discharge:  Follow up with PCP in 1-2 weeks Patient will need colonoscopy in 6-8 weeks following this admission for recurrent acute diverticulitis.   Discharge Diagnoses: Principal Problem:   Acute diverticulitis Active Problems:   Essential hypertension   Diabetes (HCC)   Cirrhosis of liver without ascites [facility])  Hospital Course: IOMA CHISMAR is a 78 y.o. female with medical history significant for diverticulitis, diabetes mellitus, hypertension, seizures, coronary artery disease. Patient presented to the ED with complaints of left lower quadrant pain.  Patient was in the ED 10/5 and again 10/10, was diagnosed with acute diverticulitis, was discharged to complete a course of ciprofloxacin  and metronidazole .  She reports symptoms never completely resolved.  She reports nausea but no vomiting.  No diarrhea.  No fevers no chills.  She denies urinary symptoms.  She denies recent colonoscopies.   ED Course: Temperature 99.3.  Heart rate 76-88.  Respirate rate 18-18.  Blood pressure systolic 118-155. WBC 9.3.  Lactic acidosis of 2.5.  UA not suggestive of UTI. CTAP W contrast-acute uncomplicated descending colon diverticulitis, progressed from 01/03/24, colonoscopy recommended after resolution of acute symptoms to exclude underlying mass. Blood cultures obtained. 1 L bolus given IV Cipro  and metronidazole  started.  Hospital Course:  Symptoms drastically improved and oral intake has been sufficient. Will discharge on oral antibiotics identical to IV that provided improvement.   Assessment and Plan: Acute recurrent diverticulitis: Failed outpatient management. With IV cipro /flagyl , symptoms have abated, exam is very reassuring, patient tolerating po and requesting discharge.   - Will convert to po cipro /flagyl  to complete the course (total 7 days per AGA guideline). Note PCN allergy. - Will need colonoscopy after convalescence.      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    T2DM:  - Continue SSI - Hold Actos, semaglutide   Liver cirrhosis: No stigmata of chronic liver disease.  Consultants: None Procedures performed: None  Disposition: Home Diet recommendation: Heart healthy/carb-modified DISCHARGE MEDICATION: Allergies as of 02/03/2024       Reactions   Clarithromycin Shortness Of Breath   Codeine Shortness Of Breath   Hydrocodone Shortness Of Breath   Penicillins Shortness Of Breath, Other (See Comments)   PATIENT HAS HAD A PCN REACTION WITH IMMEDIATE RASH, FACIAL/TONGUE/THROAT SWELLING, SOB, OR LIGHTHEADEDNESS WITH HYPOTENSION:  #  #  #  YES  #  #  #   Has patient had a PCN reaction causing severe rash involving mucus membranes or skin necrosis: No Has patient had a PCN reaction that required hospitalization: No Has patient had a PCN reaction occurring within the last 10 years: No   Sulfa Antibiotics Shortness Of Breath   Dilaudid  [hydromorphone  Hcl] Other (See Comments)   ORAL SORES   Oxycodone  Hcl Nausea Only        Medication List     TAKE these medications    amLODipine  10 MG tablet Commonly known as: NORVASC  TAKE ONE TABLET BY MOUTH EVERY MORNING   aspirin  EC 81 MG tablet Take 1 tablet (81 mg total) by mouth daily. Swallow whole.   Biofreeze 4 % Gel Generic drug: Menthol  (Topical Analgesic) Apply 1 Application topically as needed (for pain).   ciprofloxacin  500 MG tablet  Commonly known as: Cipro  Take 1 tablet (500 mg total) by mouth 2 (two) times daily for 5 days.   ezetimibe  10 MG tablet Commonly known as: Zetia  Take 1 tablet (10 mg total) by mouth daily.   furosemide 20 MG tablet Commonly known as: LASIX Take 20 mg by mouth daily.   losartan  50 MG  tablet Commonly known as: COZAAR  TAKE ONE TABLET BY MOUTH TWICE DAILY   metroNIDAZOLE  500 MG tablet Commonly known as: FLAGYL  Take 1 tablet (500 mg total) by mouth 2 (two) times daily for 5 days.   pioglitazone 30 MG tablet Commonly known as: ACTOS Take 30 mg by mouth daily.   polyethylene glycol 17 g packet Commonly known as: MiraLax  Take 17 g by mouth daily. What changed:  when to take this reasons to take this   Rybelsus 7 MG Tabs Generic drug: Semaglutide Take 7 mg by mouth every morning. Before all other medications in the morning   spironolactone  25 MG tablet Commonly known as: ALDACTONE  TAKE ONE TABLET BY MOUTH EVERY MORNING        Follow-up Information     Katrinka Aquas, MD Follow up.   Specialty: Internal Medicine Contact information: [address] [place] [address] [phone]                Discharge Exam: Jessica Hoover   02/01/24 1510  Weight: 90.7 kg  BP (!) 151/74   Pulse 72   Temp 97.7 F (36.5 C)   Resp 18   Ht 5' 4 (1.626 m)   Wt 90.7 kg   SpO2 93%   BMI 34.33 kg/m   Pleasant, well-appearing, lively, high-spirited female in no distress sitting on bench. Says she is ready to go, doesn't need pain medicines at discharge. Eating fine.  Soft, NT, ND, +BS says she can feel it when I press deep in LLQ but minimal reaction. +BS  Condition at discharge: stable  The results of significant diagnostics from this hospitalization (including imaging, microbiology, ancillary and laboratory) are listed below for reference.   Imaging Studies: CT ABDOMEN PELVIS W CONTRAST Result Date: 02/01/2024 EXAM: CT ABDOMEN AND PELVIS WITH CONTRAST 02/01/2024 05:01:03 PM TECHNIQUE: CT of the abdomen and pelvis was performed with the administration of 80 mL of iohexol  (OMNIPAQUE ) 300 MG/ML solution. Multiplanar reformatted images are provided for review. Automated exposure control, iterative reconstruction, and/or weight-based adjustment of the  mA/kV was utilized to reduce the radiation dose to as low as reasonably achievable. COMPARISON: 10 / 10 / 25. CLINICAL HISTORY: LLQ abdominal pain. FINDINGS: LOWER CHEST: No acute abnormality. LIVER: Cirrhosis. GALLBLADDER AND BILE DUCTS: Cholecystectomy. No biliary ductal dilatation. SPLEEN: No acute abnormality. PANCREAS: No acute abnormality. ADRENAL GLANDS: No acute abnormality. KIDNEYS, URETERS AND BLADDER: No stones in the kidneys or ureters. No hydronephrosis. No perinephric or periureteral stranding. Urinary bladder is unremarkable. GI AND BOWEL: Stomach demonstrates no acute abnormality. Extensive descending and sigmoid diverticulosis. Wall thickening and adjacent pericolonic inflammatory stranding and trace fluid about the descending colon. Normal appendix. Findings are consistent with acute uncomplicated descending colon diverticulitis. There is no bowel obstruction. PERITONEUM AND RETROPERITONEUM: No ascites. No free air. VASCULATURE: Aorta is normal in caliber. Aortic atherosclerotic calcification. LYMPH NODES: No lymphadenopathy. REPRODUCTIVE ORGANS: No acute abnormality. BONES AND SOFT TISSUES: Evaluation of the pelvis is limited due to streak artifact from bilateral hip arthroplasties. No acute osseous abnormality. No focal soft tissue abnormality. IMPRESSION: 1. Acute uncomplicated descending colon diverticulitis, progress from 10 / 10 /  25. Consider a follow up colonoscopy after resolution of acute symptoms to exclude underlying mass. Electronically signed by: Norman Gatlin MD 02/01/2024 05:07 PM EST RP Workstation: [id number]    Microbiology: Results for orders placed or performed during the hospital encounter of 02/01/24  Culture, blood (routine x 2)     Status: None (Preliminary result)   Collection Time: 02/01/24  3:51 PM   Specimen: BLOOD  Result Value Ref Range Status   Specimen Description BLOOD BLOOD RIGHT HAND  Final   Special Requests AEROBIC BOTTLE ONLY Blood Culture adequate  volume  Final   Culture   Final    NO GROWTH 2 DAYS Performed at [facility], [address], [place], [place] [address]    Report Status PENDING  Incomplete  Culture, blood (routine x 2)     Status: None (Preliminary result)   Collection Time: 02/01/24  3:51 PM   Specimen: BLOOD  Result Value Ref Range Status   Specimen Description BLOOD LEFT ANTECUBITAL  Final   Special Requests   Final    BOTTLES DRAWN AEROBIC AND ANAEROBIC Blood Culture adequate volume   Culture   Final    NO GROWTH 2 DAYS Performed at [facility], [address] [place], [place] [address]    Report Status PENDING  Incomplete    Labs: CBC: Recent Labs  Lab 02/01/24 1551 02/02/24 0504  WBC 9.3 6.8  NEUTROABS 6.4  --   HGB 12.4 10.3*  HCT 39.8 33.2*  MCV 90.9 89.5  PLT 282 244   Basic Metabolic Panel: Recent Labs  Lab 02/01/24 1551 02/02/24 0504  NA 138 140  K 3.7 3.6  CL 100 106  CO2 25 25  GLUCOSE 109* 91  BUN 21 15  CREATININE 1.36* 1.36*  CALCIUM 10.3 9.2   Liver Function Tests: Recent Labs  Lab 02/01/24 1551  AST 16  ALT 9  ALKPHOS 94  BILITOT 0.3  PROT 8.2*  ALBUMIN 4.6   CBG: Recent Labs  Lab 02/02/24 1644 02/02/24 1959 02/02/24 2345 02/03/24 0405 02/03/24 0741  GLUCAP 118* 151* 87 98 118*    Discharge time spent: greater than 30 minutes.  Signed: Bernardino KATHEE Come, MD [facility] 02/03/2024

## 2024-02-03 NOTE — Plan of Care (Signed)

## 2024-02-03 NOTE — Progress Notes (Signed)
 Discharge instructions (including medications) discussed with and copy provided to patient. Patient given the opportunity to ask questions. Questions clarified.

## 2024-02-03 NOTE — Telephone Encounter (Signed)
 Called pt to give her UA results from MD Dahlstedt lvm for pt and advised to c/b if needed

## 2024-02-03 NOTE — Telephone Encounter (Signed)
-----   Message from Garnette HERO Dahlstedt sent at 02/02/2024  9:10 AM EST ----- Let pt know that urine was clear--no blood cells. No further eval needed ----- Message ----- From: Interface, [facility] Lab Results In Sent: 01/30/2024   3:38 PM EST To: Garnette Shack, MD

## 2024-02-06 LAB — CULTURE, BLOOD (ROUTINE X 2)
Culture: NO GROWTH
Culture: NO GROWTH
Special Requests: ADEQUATE
Special Requests: ADEQUATE

## 2024-02-25 ENCOUNTER — Other Ambulatory Visit (HOSPITAL_COMMUNITY): Payer: Self-pay | Admitting: Family Medicine

## 2024-02-25 DIAGNOSIS — Z13820 Encounter for screening for osteoporosis: Secondary | ICD-10-CM

## 2024-02-27 ENCOUNTER — Inpatient Hospital Stay (HOSPITAL_COMMUNITY): Admission: RE | Admit: 2024-02-27 | Discharge: 2024-02-27 | Attending: Family Medicine | Admitting: Family Medicine

## 2024-02-27 DIAGNOSIS — Z13820 Encounter for screening for osteoporosis: Secondary | ICD-10-CM | POA: Insufficient documentation

## 2024-03-12 ENCOUNTER — Encounter (INDEPENDENT_AMBULATORY_CARE_PROVIDER_SITE_OTHER): Payer: Self-pay | Admitting: *Deleted

## 2024-03-17 ENCOUNTER — Encounter (INDEPENDENT_AMBULATORY_CARE_PROVIDER_SITE_OTHER): Payer: Self-pay | Admitting: Gastroenterology

## 2024-03-17 ENCOUNTER — Ambulatory Visit (INDEPENDENT_AMBULATORY_CARE_PROVIDER_SITE_OTHER): Admitting: Gastroenterology

## 2024-03-17 ENCOUNTER — Telehealth (INDEPENDENT_AMBULATORY_CARE_PROVIDER_SITE_OTHER): Payer: Self-pay

## 2024-03-17 VITALS — BP 128/71 | HR 84 | Temp 97.3°F | Ht 64.0 in | Wt 198.4 lb

## 2024-03-17 DIAGNOSIS — K7460 Unspecified cirrhosis of liver: Secondary | ICD-10-CM

## 2024-03-17 DIAGNOSIS — K5732 Diverticulitis of large intestine without perforation or abscess without bleeding: Secondary | ICD-10-CM

## 2024-03-17 MED ORDER — PEG 3350-KCL-NA BICARB-NACL 420 G PO SOLR: 4000.0000 mL | Freq: Once | ORAL | 0 refills | Status: AC

## 2024-03-17 NOTE — Progress Notes (Signed)
 "  Referring Provider: Katrinka Aquas, MD Primary Care Physician:  Katrinka Aquas, MD Primary GI Physician: Dr. Cinderella   Chief Complaint  Patient presents with   Follow-up    Patient here today for a follow up on Cirrhosis. She is currently taking Furosemide 20 mg once per day and spironolactone  25 mg once per day. Patient denies any current gi related issues.     HPI:   Jessica Hoover is a 78 y.o. 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 At that time most recent labs on 10/5 with plat count 287k, no indication for EGD for EV screening  Recommended US  for [facility] screening due January, will update MELD labs, AFP in January, continue spironolactone  25mg  daily/lasix 20mg  daily (managed by PCP)  Most recent labs in November with hgb 10.3, plt count 244k, creat 1.26 AST 16 ALT 9 alk phos 94 t bili 0.3 sodium 138 potassium 3.7   CT A/P with contrast 11/8 Acute uncomplicated descending colon diverticulitis, progress from 10 / 10 / 25. Consider a follow up colonoscopy after resolution of acute symptoms to exclude underlying mass.  CT A/P with contrast on 10/10  Pancolonic diverticulosis, greatest in the sigmoid colon, with very mild adjacent inflammatory changes in the distal descending colon, slightly improved. No abscess  Present:  States she had recent  hospital admission due to episode of diverticulitis (11/8). She states history of diverticulitis, she  saw PCP and requested antibiotics and was told to do liquid diet and take tylenol . She went to the ED on 10/5 with CT showing diverticulitis, given cipro   and flatyl and discharged,  took cipro  without resolution, she went back on 10/10 with ongoing symptoms, given miralax  and pain meds and discharged. She returned to the ED in November with progressing acute uncomplicated diverticulitis. She was given cipro  and flagyl  and admitted.  She reports that stomach is feeling much better with resolution of symptoms. Bowels are moving well without any issue, she is using miralax  PRN. Denies rectal bleeding or melena. She has had some bloating to her stomach but states this is better since resolution of her diverticulitis. No swelling to LEs. Denies any s/s of HE.   Previous MELD 3.0: July 2025 11   Cirrhosis related questions: Episodes of confusion/disorientation:  none  Taking diuretics? Lasix 20mg /spironolactone  25mg  daily  Beta blockers? none Prior history of variceal banding? No  Prior episodes of SBP? No  Last liver imaging: as above, Elastography in July 2025 with Kpa 13.9, no liver lesions Last AFP: July 2025 3.3 Last EGD for EV screening: none  Alcohol use: none   US  complete w elastography 10/03/23   Cirrhotic changes involving the liver without focal hepatic lesion. Status post cholecystectomy.  No biliary dilatation. Median kPa:  14.9   Labs done 09/24/23 Ferritin 444 iron saturation 26% Normal liver enzymes AFP 3.3 INR 1.0 ANA negative AMA negative ASMA negative Hep A nonimmune Hep B core antibody negative, Hep B surface antibody negative Hep B surface antigen negative Hep B nonimmune non-exposed Hep C negative HIV negative  Last ZHI:wnwz Last Colonoscopy:many years ago at [facility]  03/17/24 0959  Weight: 198 lb 6.4 oz (90 kg)     Past Medical History:  Diagnosis Date   Arthritis    Chronic back pain    Chronic pelvic pain in female    pelvic joint and thigh   Chronic right hip pain    Chronic shoulder pain    Cirrhosis (HCC)    Diverticulitis    Frequency of urination    Heart murmur     Hypertension    Lumbago    Neuralgia and neuritis    Osteoarthritis    Pneumonia    hx of   PONV (postoperative nausea and vomiting)    Radiculitis    Sciatica    Seizures (HCC)    had seizures as a child    Past Surgical History:  Procedure Laterality Date   ABDOMINAL HYSTERECTOMY     partial   BACK SURGERY     CHOLECYSTECTOMY     COLONOSCOPY W/ POLYPECTOMY     JOINT REPLACEMENT     TOTAL HIP ARTHROPLASTY Right 10/26/2014   Procedure: TOTAL HIP ARTHROPLASTY;  Surgeon: Maude LELON Right, MD;  Location: [facility] OR;  Service: Orthopedics;  Laterality: Right;   TOTAL HIP ARTHROPLASTY Left 10/09/2016   TOTAL HIP ARTHROPLASTY Left 10/09/2016   Procedure: LEFT TOTAL HIP ARTHROPLASTY;  Surgeon: Right Maude LELON, MD;  Location: [facility] OR;  Service: Orthopedics;  Laterality: Left;    Current Outpatient Medications  Medication Sig Dispense Refill   amLODipine  (NORVASC ) 10 MG tablet TAKE ONE TABLET BY MOUTH EVERY MORNING 90 tablet 3   aspirin  EC 81 MG tablet Take 1 tablet (81 mg total) by mouth daily. Swallow whole. 90 tablet 3   ezetimibe  (ZETIA ) 10 MG tablet Take 1 tablet (10 mg total) by mouth daily. 90 tablet 2   furosemide (LASIX) 20 MG tablet Take 20 mg by mouth daily.     losartan  (COZAAR ) 50 MG tablet TAKE ONE TABLET BY MOUTH TWICE DAILY 180 tablet 3   Menthol , Topical Analgesic, (BIOFREEZE) 4 % GEL Apply 1 Application topically as needed (for pain).     polyethylene glycol (MIRALAX ) 17 g packet Take 17 g by mouth daily. (Patient taking differently: Take 17 g by mouth as needed.) 14 each 0   Semaglutide (RYBELSUS) 7 MG TABS Take 7 mg by mouth every morning. Before all other medications in the morning (Patient taking differently: Take 14 mg by mouth every morning. Before all other medications in the morning)     spironolactone  (ALDACTONE ) 25 MG tablet TAKE ONE TABLET BY MOUTH EVERY MORNING 90 tablet 3   No current facility-administered medications for this visit.    Allergies as of  03/17/2024 - Review Complete 03/17/2024  Allergen Reaction Noted   Clarithromycin Shortness Of Breath 01/25/2012   Codeine Shortness Of Breath 01/25/2012   Hydrocodone Shortness Of Breath 01/25/2012   Penicillins Shortness Of Breath and Other (See Comments) 01/25/2012   Sulfa antibiotics Shortness Of Breath 01/25/2012   Dilaudid  [hydromorphone  hcl] Other (See Comments) 11/04/2014   Oxycodone  hcl Nausea Only 02/03/2012    Social History   Socioeconomic History   Marital status: Widowed    Spouse name: Not on file   Number of children: Not on file   Years of education: Not on file   Highest education level: Not on file  Occupational History   Not on file  Tobacco Use   Smoking status: Some Days    Current packs/day: 0.25    Average packs/day: 0.3  packs/day for 58.7 years (14.7 ttl pk-yrs)    Types: Cigarettes    Start date: 07/11/1965   Smokeless tobacco: Never   Tobacco comments:    3 per week  Vaping Use   Vaping status: Never Used  Substance and Sexual Activity   Alcohol use: No    Alcohol/week: 0.0 standard drinks of alcohol   Drug use: No   Sexual activity: Not Currently  Other Topics Concern   Not on file  Social History Narrative   Not on file   Social Drivers of Health   Tobacco Use: High Risk (03/17/2024)   Patient History    Smoking Tobacco Use: Some Days    Smokeless Tobacco Use: Never    Passive Exposure: Not on file  Financial Resource Strain: Not on file  Food Insecurity: No Food Insecurity (02/01/2024)   Epic    Worried About Programme Researcher, Broadcasting/film/video in the Last Year: Never true    [employer] of Food in the Last Year: Never true  Transportation Needs: No Transportation Needs (02/01/2024)   Epic    Lack of Transportation (Medical): No    Lack of Transportation (Non-Medical): No  Physical Activity: Not on file  Stress: Not on file  Social Connections: Moderately Integrated (02/01/2024)   Social Connection and Isolation Panel    Frequency of Communication  with Friends and Family: More than three times a week    Frequency of Social Gatherings with Friends and Family: More than three times a week    Attends Religious Services: More than 4 times per year    Active Member of [employer] or Organizations: Yes    Attends Banker Meetings: More than 4 times per year    Marital Status: Widowed  Depression (PHQ2-9): Not on file  Alcohol Screen: Not on file  Housing: Low Risk (02/01/2024)   Epic    Unable to Pay for Housing in the Last Year: No    Number of Times Moved in the Last Year: 0    Homeless in the Last Year: No  Utilities: Not At Risk (02/01/2024)   Epic    Threatened with loss of utilities: No  Health Literacy: Not on file    Review of systems General: negative for malaise, night sweats, fever, chills, weight loss Neck: Negative for lumps, goiter, pain and significant neck swelling Resp: Negative for cough, wheezing, dyspnea at rest CV: Negative for chest pain, leg swelling, palpitations, orthopnea GI: denies melena, hematochezia, nausea, vomiting, diarrhea, constipation, dysphagia, odyonophagia, early satiety or unintentional weight loss.  MSK: Negative for joint pain or swelling, back pain, and muscle pain. Derm: Negative for itching or rash Psych: Denies depression, anxiety, memory loss, confusion. No homicidal or suicidal ideation.  Heme: Negative for prolonged bleeding, bruising easily, and swollen nodes. Endocrine: Negative for cold or heat intolerance, polyuria, polydipsia and goiter. Neuro: negative for tremor, gait imbalance, syncope and seizures. The remainder of the review of systems is noncontributory.  Physical Exam: BP 128/71 (BP Location: Left Arm, Patient Position: Sitting, Cuff Size: Normal)   Pulse 84   Temp (!) 97.3 F (36.3 C) (Temporal)   Ht 5' 4 (1.626 m)   Wt 198 lb 6.4 oz (90 kg)   BMI 34.06 kg/m  General:   Alert and oriented. No distress noted. Pleasant and cooperative.  Head:  Normocephalic  and atraumatic. Eyes:  Conjuctiva clear without scleral icterus. Mouth:  Oral mucosa pink and moist. Good dentition. No lesions. Heart: Normal rate and  rhythm, s1 and s2 heart sounds present.  Lungs: Clear lung sounds in all lobes. Respirations equal and unlabored. Abdomen:  +BS, soft, non-tender and non-distended. No rebound or guarding. No HSM or masses noted. Derm: No palmar erythema or jaundice Msk:  Symmetrical without gross deformities. Normal posture. Extremities:  Without edema. Neurologic:  Alert and  oriented x4 Psych:  Alert and cooperative. Normal mood and affect.  Invalid input(s): 6 MONTHS   ASSESSMENT: MCKINZEY ENTWISTLE is a 78 y.o. female presenting today for follow up of cirrhosis and recent hospital admission for diverticulitis  Patient with acute onset of LLQ pain with CT confirmed diverticulitis in early October, treated with PO cipro  and flagyl , with interval CT a few days after with some improvement, though returned in November with ongoing LLQ pain with CT findings representing uncomplicated descending colon diverticulitis. She was admitted and given antibiotics with resolution of her symptoms. She repots history of intermittent diverticulitis over the years, last colonoscopy was many years ago. Discussed indications of colonoscopy after acute diverticulitis as in rare cases malignancy can masquerade as diverticulitis. Indications, risks and benefits of procedure discussed in detail with patient. Patient verbalized understanding and is in agreement to proceed with Colonoscopy.   Cirrhosis: likely secondary to MASLD. Serologies and viral testing as above was unremarkable. At this time cirrhosis appears well compensated. No signs of HE. Most recent MELD 3.0 in July is 11. She is up to date on [facility] screening and MELD labs until next month. No indication for EGD for EV screening given plt count >150k.     PLAN:  -RUQ US  for HCC screening January -MELD labs, AFP, CBC in  janaury -continue spironolactone  25mg  daily/lasix 20mg  daily (managed by PCP) -Reduce salt intake to <2 g per day -Can take Tylenol  max of 2 g per day (650 mg q8h) for pain -Avoid NSAIDs for pain -Avoid eating raw oysters/shellfish -Ensure every night before going to sleep -schedule colonoscopy- ASA III  -high fiber diet  All questions were answered, patient verbalized understanding and is in agreement with plan as outlined above.    Follow Up: February 2026   Karla Vines L. Dallon Dacosta, MSN, APRN, AGNP-C Adult-Gerontology Nurse Practitioner [facility]  "

## 2024-03-17 NOTE — Patient Instructions (Signed)
 Around early January, please go to the labs and do lab work for your liver We will get you scheduled for US  of the liver in January as well We will schedule colonoscopy given recent colonoscopy  - Reduce salt intake to <2 g per day - Can take Tylenol  max of 2 g per day (650 mg q8h) for pain - Avoid NSAIDs for pain - Avoid eating raw oysters/shellfish - Ensure every night before going to sleep  Follow up in February  It was a pleasure to see you today. I want to create trusting relationships with patients and provide genuine, compassionate, and quality care. I truly value your feedback! please be on the lookout for a survey regarding your visit with me today. I appreciate your input about our visit and your time in completing this!    Jessica Birkhead L. Idonna Heeren, MSN, APRN, AGNP-C Adult-Gerontology Nurse Practitioner [facility]

## 2024-03-17 NOTE — Telephone Encounter (Signed)
 Prior auth not required.

## 2024-03-17 NOTE — Telephone Encounter (Signed)
 Spoke with patient in the office, scheduled her colonoscopy for 04/17/2024 at 2pm. Rx sent to pharmacy. Instructions given to patient. Also scheduled ultrasound for 04/06/2024 at 8:30am, appt letter given to patient.

## 2024-03-27 LAB — CBC
HCT: 37 % (ref 35.9–46.0)
Hemoglobin: 12 g/dL (ref 11.7–15.5)
MCH: 28.2 pg (ref 27.0–33.0)
MCHC: 32.4 g/dL (ref 31.6–35.4)
MCV: 86.9 fL (ref 81.4–101.7)
MPV: 10.6 fL (ref 7.5–12.5)
Platelets: 279 Thousand/uL (ref 140–400)
RBC: 4.26 Million/uL (ref 3.80–5.10)
RDW: 13.4 % (ref 11.0–15.0)
WBC: 6.7 Thousand/uL (ref 3.8–10.8)

## 2024-03-27 LAB — COMPREHENSIVE METABOLIC PANEL WITH GFR
AG Ratio: 1.7 (calc) (ref 1.0–2.5)
ALT: 11 U/L (ref 6–29)
AST: 14 U/L (ref 10–35)
Albumin: 4.3 g/dL (ref 3.6–5.1)
Alkaline phosphatase (APISO): 103 U/L (ref 37–153)
BUN/Creatinine Ratio: 11 (calc) (ref 6–22)
BUN: 16 mg/dL (ref 7–25)
CO2: 28 mmol/L (ref 20–32)
Calcium: 10.1 mg/dL (ref 8.6–10.4)
Chloride: 102 mmol/L (ref 98–110)
Creat: 1.49 mg/dL — ABNORMAL HIGH (ref 0.60–1.00)
Globulin: 2.6 g/dL (ref 1.9–3.7)
Glucose, Bld: 98 mg/dL (ref 65–99)
Potassium: 3.7 mmol/L (ref 3.5–5.3)
Sodium: 141 mmol/L (ref 135–146)
Total Bilirubin: 0.5 mg/dL (ref 0.2–1.2)
Total Protein: 6.9 g/dL (ref 6.1–8.1)
eGFR: 36 mL/min/1.73m2 — ABNORMAL LOW

## 2024-03-27 LAB — PROTIME-INR
INR: 1
Prothrombin Time: 10.4 s (ref 9.0–11.5)

## 2024-03-27 LAB — AFP TUMOR MARKER: AFP-Tumor Marker: 3.9 ng/mL

## 2024-04-01 ENCOUNTER — Ambulatory Visit (INDEPENDENT_AMBULATORY_CARE_PROVIDER_SITE_OTHER): Payer: Self-pay | Admitting: Gastroenterology

## 2024-04-06 ENCOUNTER — Ambulatory Visit (HOSPITAL_COMMUNITY)
Admission: RE | Admit: 2024-04-06 | Discharge: 2024-04-06 | Disposition: A | Discharge status: Home / Self Care | Source: Ambulatory Visit | Attending: Gastroenterology | Admitting: Gastroenterology

## 2024-04-06 DIAGNOSIS — K5732 Diverticulitis of large intestine without perforation or abscess without bleeding: Secondary | ICD-10-CM | POA: Diagnosis present

## 2024-04-07 ENCOUNTER — Ambulatory Visit (INDEPENDENT_AMBULATORY_CARE_PROVIDER_SITE_OTHER): Payer: Self-pay | Admitting: Gastroenterology

## 2024-04-13 ENCOUNTER — Encounter (HOSPITAL_COMMUNITY)
Admission: RE | Admit: 2024-04-13 | Discharge: 2024-04-13 | Disposition: A | Discharge status: Home / Self Care | Payer: Self-pay | Source: Ambulatory Visit | Attending: Gastroenterology | Admitting: Gastroenterology

## 2024-04-13 NOTE — Telephone Encounter (Signed)
 Spoke with patient, she stated that she has not held her Rybelsus 7 days prior and needs to reschedule. I have rescheduled patient, sent a message to ENDO and sent the patient updated instructions.

## 2024-04-28 ENCOUNTER — Encounter (HOSPITAL_COMMUNITY): Payer: Self-pay

## 2024-04-28 ENCOUNTER — Encounter (HOSPITAL_COMMUNITY)
Admission: RE | Admit: 2024-04-28 | Discharge: 2024-04-28 | Disposition: A | Source: Ambulatory Visit | Attending: Gastroenterology

## 2024-04-28 HISTORY — DX: Chronic kidney disease, unspecified: N18.9

## 2024-05-01 ENCOUNTER — Ambulatory Visit (HOSPITAL_COMMUNITY)
Admission: RE | Admit: 2024-05-01 | Discharge: 2024-05-01 | Disposition: A | Payer: Self-pay | Source: Home / Self Care | Attending: Gastroenterology | Admitting: Gastroenterology

## 2024-05-01 ENCOUNTER — Other Ambulatory Visit: Payer: Self-pay

## 2024-05-01 ENCOUNTER — Encounter (HOSPITAL_COMMUNITY): Payer: Self-pay | Admitting: Gastroenterology

## 2024-05-01 ENCOUNTER — Ambulatory Visit (HOSPITAL_COMMUNITY): Admitting: Anesthesiology

## 2024-05-01 ENCOUNTER — Encounter (HOSPITAL_COMMUNITY): Admission: RE | Disposition: A | Payer: Self-pay | Source: Home / Self Care | Attending: Gastroenterology

## 2024-05-01 DIAGNOSIS — K635 Polyp of colon: Secondary | ICD-10-CM

## 2024-05-01 DIAGNOSIS — D122 Benign neoplasm of ascending colon: Secondary | ICD-10-CM

## 2024-05-01 LAB — GLUCOSE, CAPILLARY: Glucose-Capillary: 139 mg/dL — ABNORMAL HIGH (ref 70–99)

## 2024-05-01 MED ORDER — LIDOCAINE HCL (CARDIAC) PF 100 MG/5ML IV SOSY
PREFILLED_SYRINGE | INTRAVENOUS | Status: DC | PRN
  Administered 2024-05-01: 50 mg via INTRAVENOUS

## 2024-05-01 MED ORDER — DEXMEDETOMIDINE HCL IN NACL 80 MCG/20ML IV SOLN
INTRAVENOUS | Status: DC | PRN
  Administered 2024-05-01: 6 ug via INTRAVENOUS

## 2024-05-01 MED ORDER — STERILE WATER FOR IRRIGATION IR SOLN
Status: DC | PRN
  Administered 2024-05-01: 80 mL

## 2024-05-01 MED ORDER — LACTATED RINGERS IV SOLN: INTRAVENOUS | Status: DC

## 2024-05-01 MED ORDER — PROPOFOL 10 MG/ML IV BOLUS
INTRAVENOUS | Status: DC | PRN
  Administered 2024-05-01: 100 mg via INTRAVENOUS
  Administered 2024-05-01: 175 ug/kg/min via INTRAVENOUS

## 2024-05-01 NOTE — Anesthesia Preprocedure Evaluation (Signed)
"                                    Anesthesia Evaluation  Patient identified by MRN, date of birth, ID band Patient awake    Reviewed: Allergy & Precautions, H&P , NPO status , Patient's Chart, lab work & pertinent test results, reviewed documented beta blocker date and time   History of Anesthesia Complications (+) PONV and history of anesthetic complications  Airway Mallampati: II  TM Distance: >3 FB Neck ROM: full    Dental no notable dental hx.    Pulmonary pneumonia, Current Smoker   Pulmonary exam normal breath sounds clear to auscultation       Cardiovascular Exercise Tolerance: Good hypertension, + Peripheral Vascular Disease  + Valvular Problems/Murmurs  Rhythm:regular Rate:Normal     Neuro/Psych Seizures -,   Neuromuscular disease  negative psych ROS   GI/Hepatic negative GI ROS,,,(+) Cirrhosis         Endo/Other  diabetes    Renal/GU Renal disease  negative genitourinary   Musculoskeletal   Abdominal   Peds  Hematology negative hematology ROS (+)   Anesthesia Other Findings   Reproductive/Obstetrics negative OB ROS                              Anesthesia Physical Anesthesia Plan  ASA: 3  Anesthesia Plan: MAC   Post-op Pain Management:    Induction:   PONV Risk Score and Plan: Propofol  infusion  Airway Management Planned:   Additional Equipment:   Intra-op Plan:   Post-operative Plan:   Informed Consent: I have reviewed the patients History and Physical, chart, labs and discussed the procedure including the risks, benefits and alternatives for the proposed anesthesia with the patient or authorized representative who has indicated his/her understanding and acceptance.     Dental Advisory Given  Plan Discussed with: CRNA  Anesthesia Plan Comments:         Anesthesia Quick Evaluation  "

## 2024-05-01 NOTE — Anesthesia Postprocedure Evaluation (Signed)
"   Anesthesia Post Note  Patient: Jessica Hoover  Procedure(s) Performed: COLONOSCOPY COLONOSCOPY, WITH POLYPECTOMY  Patient location during evaluation: Phase II Anesthesia Type: MAC Level of consciousness: awake Pain management: pain level controlled Vital Signs Assessment: post-procedure vital signs reviewed and stable Respiratory status: spontaneous breathing and respiratory function stable Cardiovascular status: blood pressure returned to baseline and stable Postop Assessment: no headache and no apparent nausea or vomiting Anesthetic complications: no Comments: Late entry   No notable events documented.   Last Vitals:  Vitals:   05/01/24 0908 05/01/24 0913  BP: (!) 82/51 105/68  Pulse: 70   Resp: (!) 21 19  Temp:    SpO2:  99%    Last Pain:  Vitals:   05/01/24 0903  TempSrc: Oral  PainSc: 0-No pain                 Yvonna PARAS Karilyn Wind      "

## 2024-05-01 NOTE — Op Note (Signed)
 [facility] Patient Name: Jessica Hoover Procedure Date: 05/01/2024 8:13 AM MRN: [id number] Date of Birth: Aug 02, 1945 Attending MD: Deatrice Dine , MD, [id number] CSN: [id number] Age: 79 Admit Type: Outpatient Procedure:                Colonoscopy Indications:              Follow-up of diverticulitis Providers:                Deatrice Dine, MD, Rosina Sprague, Daphne Mulch                            Technician, Technician Referring MD:              Medicines:                Monitored Anesthesia Care Complications:            No immediate complications. Estimated Blood Loss:     Estimated blood loss was minimal. Procedure:                Pre-Anesthesia Assessment:                           - Prior to the procedure, a History and Physical                            was performed, and patient medications and                            allergies were reviewed. The patient's tolerance of                            previous anesthesia was also reviewed. The risks                            and benefits of the procedure and the sedation                            options and risks were discussed with the patient.                            All questions were answered, and informed consent                            was obtained. Prior Anticoagulants: The patient has                            taken no anticoagulant or antiplatelet agents. ASA                            Grade Assessment: III - A patient with severe                            systemic disease. After reviewing the risks and  benefits, the patient was deemed in satisfactory                            condition to undergo the procedure.                           After obtaining informed consent, the colonoscope                            was passed under direct vision. Throughout the                            procedure, the patient's blood pressure, pulse, and                            oxygen  saturations were monitored continuously. The                            PCF-HQ190L ([id number]) Peds Colon was introduced                            through the anus and advanced to the the cecum,                            identified by appendiceal orifice and ileocecal                            valve. The colonoscopy was performed without                            difficulty. The patient tolerated the procedure                            well. The quality of the bowel preparation was                            evaluated using the BBPS [facility] Bowel Preparation                            Scale) with scores of: Right Colon = 2 (minor                            amount of residual staining, small fragments of                            stool and/or opaque liquid, but mucosa seen well),                            Transverse Colon = 2 (minor amount of residual                            staining, small fragments of stool and/or opaque  liquid, but mucosa seen well) and Left Colon = 2                            (minor amount of residual staining, small fragments                            of stool and/or opaque liquid, but mucosa seen                            well). The total BBPS score equals 6. The ileocecal                            valve, appendiceal orifice, and rectum were                            photographed. Scope In: 8:28:51 AM Scope Out: 9:00:18 AM Scope Withdrawal Time: 0 hours 26 minutes 47 seconds  Total Procedure Duration: 0 hours 31 minutes 27 seconds  Findings:      Hemorrhoids were found on perianal exam.      Three sessile polyps were found in the descending colon, transverse       colon, ascending colon and cecum. The polyps were 4 to 6 mm in size.       These polyps were removed with a cold snare. Resection and retrieval       were complete.      A 1 mm polyp was found in the ascending colon. The polyp was sessile.       The polyp was  removed with a cold biopsy forceps. Resection and       retrieval were complete.      Scattered large-mouthed diverticula were found in the entire colon.      Non-bleeding external and internal hemorrhoids were found during       endoscopy. The hemorrhoids were large. Retroflexion not performed due to       narrow rectal vault Impression:               - Hemorrhoids found on perianal exam.                           - Three 4 to 6 mm polyps in the descending colon,                            in the transverse colon, in the ascending colon and                            in the cecum, removed with a cold snare. Resected                            and retrieved.                           - One 1 mm polyp in the ascending colon, removed                            with a cold biopsy  forceps. Resected and retrieved.                           - Diverticulosis in the entire examined colon.                           - Non-bleeding external and internal hemorrhoids. Moderate Sedation:      Per Anesthesia Care Recommendation:           - Patient has a contact number available for                            emergencies. The signs and symptoms of potential                            delayed complications were discussed with the                            patient. Return to normal activities tomorrow.                            Written discharge instructions were provided to the                            patient.                           - Resume previous diet.                           - Continue present medications.                           - Await pathology results.                           - Repeat colonoscopy in 3 years for surveillance                            based on pathology results; given multiple polyps                            and family history of colon cancer ; If medically                            fit                           - Return to GI office as previously scheduled.                            -Consider colorectal surgery eval given large                            external hemorrhoids Procedure Code(s):        --- Professional ---  54614, Colonoscopy, flexible; with removal of                            tumor(s), polyp(s), or other lesion(s) by snare                            technique                           45380, 59, Colonoscopy, flexible; with biopsy,                            single or multiple Diagnosis Code(s):        --- Professional ---                           D12.4, Benign neoplasm of descending colon                           D12.3, Benign neoplasm of transverse colon (hepatic                            flexure or splenic flexure)                           D12.2, Benign neoplasm of ascending colon                           D12.0, Benign neoplasm of cecum                           K64.8, Other hemorrhoids                           K57.32, Diverticulitis of large intestine without                            perforation or abscess without bleeding                           K57.30, Diverticulosis of large intestine without                            perforation or abscess without bleeding CPT copyright 2022 American Medical Association. All rights reserved. The codes documented in this report are preliminary and upon coder review may  be revised to meet current compliance requirements. Deatrice Dine, MD Deatrice Dine, MD 05/01/2024 9:08:53 AM This report has been signed electronically. Number of Addenda: 0

## 2024-05-01 NOTE — H&P (Signed)
 " Primary Care Physician:  [facility] Primary Gastroenterologist:  Dr. Cinderella  Pre-Procedure History & Physical: HPI: Jessica Hoover is a 79 y.o. 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 No abscess  Past Medical History:  Diagnosis Date   Arthritis    Chronic back pain    Chronic kidney disease    Chronic pelvic pain in female    pelvic joint and thigh   Chronic right hip pain    Chronic shoulder pain    Cirrhosis (HCC)    Diverticulitis    Frequency of urination    Heart murmur    Hypertension    Lumbago    Neuralgia and neuritis    Osteoarthritis    Pneumonia    hx of   PONV (postoperative nausea and vomiting)    Radiculitis    Sciatica    Seizures (HCC)    had seizures as a child    Past Surgical History:  Procedure Laterality Date   ABDOMINAL HYSTERECTOMY     partial   BACK SURGERY     CHOLECYSTECTOMY     COLONOSCOPY W/ POLYPECTOMY     JOINT REPLACEMENT     TOTAL HIP ARTHROPLASTY Right 10/26/2014   Procedure: TOTAL HIP ARTHROPLASTY;  Surgeon: Maude LELON Right, MD;  Location: [facility] OR;  Service: Orthopedics;  Laterality: Right;   TOTAL HIP ARTHROPLASTY Left 10/09/2016   TOTAL HIP ARTHROPLASTY Left 10/09/2016   Procedure: LEFT TOTAL HIP ARTHROPLASTY;  Surgeon: Right Maude LELON, MD;  Location: [facility] OR;  Service: Orthopedics;  Laterality: Left;    Prior to Admission medications  Medication Sig Start Date End Date Taking? Authorizing Provider  amLODipine  (NORVASC ) 10 MG tablet TAKE ONE TABLET BY MOUTH EVERY MORNING 12/23/23   Alvan Dorn FALCON, MD  aspirin  EC 81 MG tablet Take 1 tablet (81  mg total) by mouth daily. Swallow whole. 05/01/23   Alvan Dorn FALCON, MD  ezetimibe  (ZETIA ) 10 MG tablet Take 1 tablet (10 mg total) by mouth daily. 07/29/23   Alvan Dorn FALCON, MD  furosemide (LASIX) 20 MG tablet Take 20 mg by mouth daily. 12/23/23   [provider]  losartan  (COZAAR ) 50 MG tablet TAKE ONE TABLET BY MOUTH TWICE DAILY 11/21/23   Alvan Dorn FALCON, MD  Menthol , Topical Analgesic, (BIOFREEZE) 4 % GEL Apply 1 Application topically as needed (for pain).    [provider]  polyethylene glycol (MIRALAX ) 17 g packet Take 17 g by mouth daily. Patient taking differently: Take 17 g by mouth as needed. 01/03/24   Beatty, Celeste A, PA-C  Semaglutide (RYBELSUS) 7 MG TABS Take 7 mg by mouth every morning. Before all other medications in the morning Patient taking differently: Take 14 mg by mouth every morning. Before all other medications in the morning    [provider]  spironolactone  (ALDACTONE ) 25 MG tablet TAKE ONE TABLET BY MOUTH EVERY MORNING 11/26/23   Alvan Dorn FALCON, MD    Allergies as of 03/17/2024 - Review Complete 03/17/2024  Allergen Reaction Noted   Clarithromycin Shortness Of Breath 01/25/2012   Codeine Shortness Of Breath 01/25/2012   Hydrocodone Shortness Of Breath 01/25/2012   Penicillins Shortness Of  Breath and Other (See Comments) 01/25/2012   Sulfa antibiotics Shortness Of Breath 01/25/2012   Dilaudid  [hydromorphone  hcl] Other (See Comments) 11/04/2014   Oxycodone  hcl Nausea Only 02/03/2012    Family History  Problem Relation Age of Onset   Hypertension Mother    Depression Mother    Hypertension Father    Cancer Father     Social History   Socioeconomic History   Marital status: Widowed    Spouse name: Not on file   Number of children: Not on file   Years of education: Not on file   Highest education level: Not on file  Occupational History   Not on file  Tobacco Use   Smoking status: Some Days    Current packs/day:  0.25    Average packs/day: 0.3 packs/day for 58.8 years (14.7 ttl pk-yrs)    Types: Cigarettes    Start date: 07/11/1965   Smokeless tobacco: Never   Tobacco comments:    3 per week  Vaping Use   Vaping status: Never Used  Substance and Sexual Activity   Alcohol use: No    Alcohol/week: 0.0 standard drinks of alcohol   Drug use: No   Sexual activity: Not Currently  Other Topics Concern   Not on file  Social History Narrative   Not on file   Social Drivers of Health   Tobacco Use: High Risk (04/28/2024)   Patient History    Smoking Tobacco Use: Some Days    Smokeless Tobacco Use: Never    Passive Exposure: Not on file  Financial Resource Strain: Not on file  Food Insecurity: No Food Insecurity (02/01/2024)   Epic    Worried About Programme Researcher, Broadcasting/film/video in the Last Year: Never true    [employer] of Food in the Last Year: Never true  Transportation Needs: No Transportation Needs (02/01/2024)   Epic    Lack of Transportation (Medical): No    Lack of Transportation (Non-Medical): No  Physical Activity: Not on file  Stress: Not on file  Social Connections: Moderately Integrated (02/01/2024)   Social Connection and Isolation Panel    Frequency of Communication with Friends and Family: More than three times a week    Frequency of Social Gatherings with Friends and Family: More than three times a week    Attends Religious Services: More than 4 times per year    Active Member of [employer] or Organizations: Yes    Attends Banker Meetings: More than 4 times per year    Marital Status: Widowed  Intimate Partner Violence: Not At Risk (02/01/2024)   Epic    Fear of Current or Ex-Partner: No    Emotionally Abused: No    Physically Abused: No    Sexually Abused: No  Depression (PHQ2-9): Not on file  Alcohol Screen: Not on file  Housing: Low Risk (02/01/2024)   Epic    Unable to Pay for Housing in the Last Year: No    Number of Times Moved in the Last Year: 0    Homeless in the  Last Year: No  Utilities: Not At Risk (02/01/2024)   Epic    Threatened with loss of utilities: No  Health Literacy: Not on file    Review of Systems: See HPI, otherwise negative ROS  Physical Exam: Vital signs in last 24 hours:     General:   Alert,  Well-developed, well-nourished, pleasant and cooperative in NAD Head:  Normocephalic and atraumatic. Eyes:  Sclera clear, no  icterus.   Conjunctiva pink. Ears:  Normal auditory acuity. Nose:  No deformity, discharge,  or lesions. Msk:  Symmetrical without gross deformities. Normal posture. Extremities:  Without clubbing or edema. Neurologic:  Alert and  oriented x4;  grossly normal neurologically. Skin:  Intact without significant lesions or rashes. Psych:  Alert and cooperative. Normal mood and affect.  Impression/Plan: Jessica Hoover is a 79 y.o. 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 Questions have been answered. All parties agreeable.  "

## 2024-05-01 NOTE — Transfer of Care (Signed)
 Immediate Anesthesia Transfer of Care Note  Patient: Jessica Hoover  Procedure(s) Performed: COLONOSCOPY COLONOSCOPY, WITH POLYPECTOMY  Patient Location: Short Stay  Anesthesia Type:MAC  Level of Consciousness: drowsy, patient cooperative, and responds to stimulation  Airway & Oxygen Therapy: Patient Spontanous Breathing  Post-op Assessment: Report given to RN and Post -op Vital signs reviewed and stable  Post vital signs: Reviewed and stable  Last Vitals:  Vitals Value Taken Time  BP 81/41 05/01/24 09:03  Temp 36.9 C 05/01/24 09:03  Pulse 69 05/01/24 09:03  Resp 17 05/01/24 09:03  SpO2 98 % 05/01/24 09:03    Last Pain:  Vitals:   05/01/24 0903  TempSrc: Oral  PainSc: 0-No pain      Patients Stated Pain Goal: 5 (05/01/24 0747)  Complications: No notable events documented.

## 2024-05-01 NOTE — Discharge Instructions (Signed)

## 2024-05-19 ENCOUNTER — Ambulatory Visit (INDEPENDENT_AMBULATORY_CARE_PROVIDER_SITE_OTHER): Admitting: Gastroenterology
